# Patient Record
Sex: Female | Born: 1935 | Race: White | Hispanic: No | Smoking: Former smoker
Health system: Southern US, Community
[De-identification: ages and names within clinical notes are randomized; demographics above are authoritative.]

## PROBLEM LIST (undated history)

## (undated) DIAGNOSIS — T4145XA Adverse effect of unspecified anesthetic, initial encounter: Secondary | ICD-10-CM

## (undated) DIAGNOSIS — Z9889 Other specified postprocedural states: Secondary | ICD-10-CM

## (undated) DIAGNOSIS — I714 Abdominal aortic aneurysm, without rupture, unspecified: Secondary | ICD-10-CM

## (undated) DIAGNOSIS — I728 Aneurysm of other specified arteries: Secondary | ICD-10-CM

## (undated) DIAGNOSIS — K635 Polyp of colon: Secondary | ICD-10-CM

## (undated) DIAGNOSIS — C801 Malignant (primary) neoplasm, unspecified: Secondary | ICD-10-CM

## (undated) DIAGNOSIS — Z8719 Personal history of other diseases of the digestive system: Secondary | ICD-10-CM

## (undated) DIAGNOSIS — M1990 Unspecified osteoarthritis, unspecified site: Secondary | ICD-10-CM

## (undated) DIAGNOSIS — I10 Essential (primary) hypertension: Secondary | ICD-10-CM

## (undated) DIAGNOSIS — L409 Psoriasis, unspecified: Secondary | ICD-10-CM

## (undated) DIAGNOSIS — T8859XA Other complications of anesthesia, initial encounter: Secondary | ICD-10-CM

## (undated) DIAGNOSIS — R112 Nausea with vomiting, unspecified: Secondary | ICD-10-CM

## (undated) DIAGNOSIS — E785 Hyperlipidemia, unspecified: Secondary | ICD-10-CM

## (undated) HISTORY — DX: Abdominal aortic aneurysm, without rupture: I71.4

## (undated) HISTORY — DX: Polyp of colon: K63.5

## (undated) HISTORY — DX: Unspecified osteoarthritis, unspecified site: M19.90

## (undated) HISTORY — PX: HERNIA REPAIR: SHX51

## (undated) HISTORY — PX: EYE SURGERY: SHX253

## (undated) HISTORY — DX: Aneurysm of other specified arteries: I72.8

## (undated) HISTORY — DX: Essential (primary) hypertension: I10

## (undated) HISTORY — DX: Hyperlipidemia, unspecified: E78.5

## (undated) HISTORY — DX: Malignant (primary) neoplasm, unspecified: C80.1

## (undated) HISTORY — DX: Abdominal aortic aneurysm, without rupture, unspecified: I71.40

## (undated) HISTORY — PX: ORIF DISTAL RADIUS FRACTURE: SUR927

---

## 2001-08-22 HISTORY — PX: MASTECTOMY: SHX3

## 2001-09-06 DIAGNOSIS — Z853 Personal history of malignant neoplasm of breast: Secondary | ICD-10-CM

## 2001-09-19 ENCOUNTER — Encounter (INDEPENDENT_AMBULATORY_CARE_PROVIDER_SITE_OTHER): Payer: Self-pay | Admitting: *Deleted

## 2001-09-20 ENCOUNTER — Encounter: Payer: Self-pay | Admitting: Surgery

## 2001-09-21 ENCOUNTER — Ambulatory Visit (HOSPITAL_COMMUNITY): Admission: RE | Admit: 2001-09-21 | Discharge: 2001-09-22 | Payer: Self-pay | Admitting: Surgery

## 2001-09-21 ENCOUNTER — Encounter: Payer: Self-pay | Admitting: Surgery

## 2001-09-21 ENCOUNTER — Encounter (INDEPENDENT_AMBULATORY_CARE_PROVIDER_SITE_OTHER): Payer: Self-pay | Admitting: Specialist

## 2001-09-22 ENCOUNTER — Encounter: Payer: Self-pay | Admitting: Surgery

## 2002-05-08 ENCOUNTER — Encounter: Payer: Self-pay | Admitting: Unknown Physician Specialty

## 2002-05-08 ENCOUNTER — Ambulatory Visit (HOSPITAL_COMMUNITY): Admission: RE | Admit: 2002-05-08 | Discharge: 2002-05-08 | Payer: Self-pay | Admitting: Unknown Physician Specialty

## 2002-10-11 ENCOUNTER — Encounter (HOSPITAL_COMMUNITY): Payer: Self-pay | Admitting: Oncology

## 2002-10-11 ENCOUNTER — Encounter: Admission: RE | Admit: 2002-10-11 | Discharge: 2002-10-11 | Payer: Self-pay | Admitting: Oncology

## 2003-04-09 ENCOUNTER — Ambulatory Visit (HOSPITAL_COMMUNITY): Admission: RE | Admit: 2003-04-09 | Discharge: 2003-04-09 | Payer: Self-pay | Admitting: Oncology

## 2003-08-16 ENCOUNTER — Other Ambulatory Visit: Admission: RE | Admit: 2003-08-16 | Discharge: 2003-08-16 | Payer: Self-pay | Admitting: Obstetrics & Gynecology

## 2003-10-12 ENCOUNTER — Encounter: Admission: RE | Admit: 2003-10-12 | Discharge: 2003-10-12 | Payer: Self-pay | Admitting: Oncology

## 2004-02-12 ENCOUNTER — Ambulatory Visit: Payer: Self-pay | Admitting: Family Medicine

## 2004-03-12 ENCOUNTER — Ambulatory Visit: Payer: Self-pay | Admitting: Family Medicine

## 2004-03-24 ENCOUNTER — Ambulatory Visit: Payer: Self-pay | Admitting: Oncology

## 2004-03-24 ENCOUNTER — Ambulatory Visit (HOSPITAL_COMMUNITY): Admission: RE | Admit: 2004-03-24 | Discharge: 2004-03-24 | Payer: Self-pay | Admitting: Oncology

## 2004-04-08 ENCOUNTER — Ambulatory Visit: Payer: Self-pay | Admitting: Family Medicine

## 2004-05-28 ENCOUNTER — Ambulatory Visit: Payer: Self-pay | Admitting: Family Medicine

## 2004-07-18 ENCOUNTER — Ambulatory Visit: Payer: Self-pay | Admitting: Oncology

## 2004-08-28 ENCOUNTER — Ambulatory Visit: Payer: Self-pay | Admitting: Family Medicine

## 2004-10-09 ENCOUNTER — Ambulatory Visit: Payer: Self-pay | Admitting: Family Medicine

## 2004-11-12 ENCOUNTER — Ambulatory Visit: Payer: Self-pay | Admitting: Oncology

## 2004-12-03 ENCOUNTER — Other Ambulatory Visit: Admission: RE | Admit: 2004-12-03 | Discharge: 2004-12-03 | Payer: Self-pay | Admitting: Obstetrics & Gynecology

## 2004-12-31 ENCOUNTER — Ambulatory Visit: Payer: Self-pay | Admitting: Family Medicine

## 2005-01-15 ENCOUNTER — Ambulatory Visit: Payer: Self-pay | Admitting: Family Medicine

## 2005-03-12 ENCOUNTER — Ambulatory Visit: Payer: Self-pay | Admitting: Oncology

## 2005-03-20 ENCOUNTER — Ambulatory Visit (HOSPITAL_COMMUNITY): Admission: RE | Admit: 2005-03-20 | Discharge: 2005-03-20 | Payer: Self-pay | Admitting: Oncology

## 2005-04-21 ENCOUNTER — Ambulatory Visit: Payer: Self-pay | Admitting: Family Medicine

## 2005-06-29 ENCOUNTER — Ambulatory Visit: Payer: Self-pay | Admitting: Family Medicine

## 2005-07-08 ENCOUNTER — Ambulatory Visit: Payer: Self-pay | Admitting: Oncology

## 2005-07-09 LAB — CBC WITH DIFFERENTIAL/PLATELET
BASO%: 0.8 % (ref 0.0–2.0)
EOS%: 1.7 % (ref 0.0–7.0)
MCH: 29.4 pg (ref 26.0–34.0)
MCHC: 34.1 g/dL (ref 32.0–36.0)
MCV: 86.3 fL (ref 81.0–101.0)
MONO%: 8 % (ref 0.0–13.0)
RBC: 3.9 10*6/uL (ref 3.70–5.32)
RDW: 14.4 % (ref 11.3–14.5)
lymph#: 1.3 10*3/uL (ref 0.9–3.3)

## 2005-07-09 LAB — COMPREHENSIVE METABOLIC PANEL
ALT: 11 U/L (ref 0–40)
AST: 15 U/L (ref 0–37)
CO2: 27 mEq/L (ref 19–32)
Creatinine, Ser: 1.1 mg/dL (ref 0.4–1.2)
Total Bilirubin: 0.4 mg/dL (ref 0.3–1.2)

## 2005-07-09 LAB — LACTATE DEHYDROGENASE: LDH: 142 U/L (ref 94–250)

## 2005-09-29 ENCOUNTER — Ambulatory Visit: Payer: Self-pay | Admitting: Family Medicine

## 2005-10-08 ENCOUNTER — Ambulatory Visit: Payer: Self-pay | Admitting: Family Medicine

## 2005-10-29 ENCOUNTER — Ambulatory Visit: Payer: Self-pay | Admitting: Oncology

## 2005-11-04 LAB — COMPREHENSIVE METABOLIC PANEL
ALT: 10 U/L (ref 0–40)
Albumin: 4 g/dL (ref 3.5–5.2)
CO2: 25 mEq/L (ref 19–32)
Calcium: 9.3 mg/dL (ref 8.4–10.5)
Chloride: 106 mEq/L (ref 96–112)
Creatinine, Ser: 1.29 mg/dL — ABNORMAL HIGH (ref 0.40–1.20)
Potassium: 4.1 mEq/L (ref 3.5–5.3)
Sodium: 141 mEq/L (ref 135–145)
Total Protein: 7.2 g/dL (ref 6.0–8.3)

## 2005-11-04 LAB — CBC WITH DIFFERENTIAL/PLATELET
BASO%: 1 % (ref 0.0–2.0)
HCT: 33.7 % — ABNORMAL LOW (ref 34.8–46.6)
HGB: 11.6 g/dL (ref 11.6–15.9)
MCHC: 34.3 g/dL (ref 32.0–36.0)
MONO#: 0.3 10*3/uL (ref 0.1–0.9)
NEUT%: 71.1 % (ref 39.6–76.8)
RDW: 14.4 % (ref 11.3–14.5)
WBC: 5.1 10*3/uL (ref 3.9–10.0)
lymph#: 1 10*3/uL (ref 0.9–3.3)

## 2005-11-04 LAB — LACTATE DEHYDROGENASE: LDH: 139 U/L (ref 94–250)

## 2005-11-18 ENCOUNTER — Ambulatory Visit: Payer: Self-pay | Admitting: Family Medicine

## 2006-01-06 ENCOUNTER — Ambulatory Visit: Payer: Self-pay | Admitting: Family Medicine

## 2006-01-27 ENCOUNTER — Ambulatory Visit: Payer: Self-pay | Admitting: Family Medicine

## 2006-03-01 ENCOUNTER — Ambulatory Visit: Payer: Self-pay | Admitting: Oncology

## 2006-03-11 ENCOUNTER — Ambulatory Visit: Payer: Self-pay | Admitting: Family Medicine

## 2006-03-16 HISTORY — PX: JOINT REPLACEMENT: SHX530

## 2006-03-18 ENCOUNTER — Ambulatory Visit (HOSPITAL_COMMUNITY): Admission: RE | Admit: 2006-03-18 | Discharge: 2006-03-18 | Payer: Self-pay | Admitting: Oncology

## 2006-03-18 LAB — COMPREHENSIVE METABOLIC PANEL
AST: 14 U/L (ref 0–37)
Albumin: 4.3 g/dL (ref 3.5–5.2)
BUN: 16 mg/dL (ref 6–23)
CO2: 25 mEq/L (ref 19–32)
Calcium: 9.6 mg/dL (ref 8.4–10.5)
Chloride: 104 mEq/L (ref 96–112)
Creatinine, Ser: 0.94 mg/dL (ref 0.40–1.20)
Glucose, Bld: 103 mg/dL — ABNORMAL HIGH (ref 70–99)

## 2006-03-18 LAB — CBC WITH DIFFERENTIAL/PLATELET
Basophils Absolute: 0 10*3/uL (ref 0.0–0.1)
EOS%: 2.1 % (ref 0.0–7.0)
Eosinophils Absolute: 0.1 10*3/uL (ref 0.0–0.5)
HCT: 35 % (ref 34.8–46.6)
HGB: 11.8 g/dL (ref 11.6–15.9)
MCH: 28.5 pg (ref 26.0–34.0)
NEUT#: 3.1 10*3/uL (ref 1.5–6.5)
NEUT%: 65 % (ref 39.6–76.8)
lymph#: 1.1 10*3/uL (ref 0.9–3.3)

## 2006-03-18 LAB — LACTATE DEHYDROGENASE: LDH: 154 U/L (ref 94–250)

## 2006-05-11 ENCOUNTER — Ambulatory Visit: Payer: Self-pay | Admitting: Family Medicine

## 2006-06-08 ENCOUNTER — Inpatient Hospital Stay (HOSPITAL_COMMUNITY): Admission: RE | Admit: 2006-06-08 | Discharge: 2006-06-11 | Payer: Self-pay | Admitting: Orthopaedic Surgery

## 2006-08-03 ENCOUNTER — Ambulatory Visit: Payer: Self-pay | Admitting: Family Medicine

## 2006-08-13 ENCOUNTER — Ambulatory Visit: Payer: Self-pay | Admitting: Oncology

## 2006-08-17 LAB — CBC WITH DIFFERENTIAL/PLATELET
Basophils Absolute: 0 10*3/uL (ref 0.0–0.1)
Eosinophils Absolute: 0.1 10*3/uL (ref 0.0–0.5)
HGB: 11.9 g/dL (ref 11.6–15.9)
MONO#: 0.4 10*3/uL (ref 0.1–0.9)
NEUT#: 4.7 10*3/uL (ref 1.5–6.5)
RBC: 4.04 10*6/uL (ref 3.70–5.32)
RDW: 15.3 % — ABNORMAL HIGH (ref 11.3–14.5)
WBC: 6.3 10*3/uL (ref 3.9–10.0)
lymph#: 1.1 10*3/uL (ref 0.9–3.3)

## 2006-08-17 LAB — LACTATE DEHYDROGENASE: LDH: 157 U/L (ref 94–250)

## 2006-08-17 LAB — COMPREHENSIVE METABOLIC PANEL
ALT: 10 U/L (ref 0–35)
BUN: 20 mg/dL (ref 6–23)
CO2: 24 mEq/L (ref 19–32)
Calcium: 9.8 mg/dL (ref 8.4–10.5)
Chloride: 106 mEq/L (ref 96–112)
Creatinine, Ser: 1.02 mg/dL (ref 0.40–1.20)
Total Bilirubin: 0.4 mg/dL (ref 0.3–1.2)

## 2006-12-14 ENCOUNTER — Ambulatory Visit: Payer: Self-pay | Admitting: Oncology

## 2006-12-19 ENCOUNTER — Encounter: Admission: RE | Admit: 2006-12-19 | Discharge: 2006-12-19 | Payer: Self-pay | Admitting: Orthopaedic Surgery

## 2006-12-30 ENCOUNTER — Encounter: Admission: RE | Admit: 2006-12-30 | Discharge: 2006-12-30 | Payer: Self-pay | Admitting: Orthopaedic Surgery

## 2007-01-05 ENCOUNTER — Encounter: Admission: RE | Admit: 2007-01-05 | Discharge: 2007-01-05 | Payer: Self-pay | Admitting: Orthopaedic Surgery

## 2007-03-25 ENCOUNTER — Ambulatory Visit: Payer: Self-pay | Admitting: Oncology

## 2007-03-29 LAB — CBC WITH DIFFERENTIAL/PLATELET
Basophils Absolute: 0 10*3/uL (ref 0.0–0.1)
EOS%: 2 % (ref 0.0–7.0)
Eosinophils Absolute: 0.1 10*3/uL (ref 0.0–0.5)
HCT: 34.7 % — ABNORMAL LOW (ref 34.8–46.6)
HGB: 12 g/dL (ref 11.6–15.9)
MONO#: 0.4 10*3/uL (ref 0.1–0.9)
NEUT#: 2.7 10*3/uL (ref 1.5–6.5)
NEUT%: 65.3 % (ref 39.6–76.8)
RDW: 14.5 % (ref 11.3–14.5)
WBC: 4.1 10*3/uL (ref 3.9–10.0)
lymph#: 0.9 10*3/uL (ref 0.9–3.3)

## 2007-03-29 LAB — COMPREHENSIVE METABOLIC PANEL
AST: 15 U/L (ref 0–37)
Albumin: 4.2 g/dL (ref 3.5–5.2)
BUN: 22 mg/dL (ref 6–23)
CO2: 28 mEq/L (ref 19–32)
Calcium: 10 mg/dL (ref 8.4–10.5)
Chloride: 101 mEq/L (ref 96–112)
Creatinine, Ser: 1.08 mg/dL (ref 0.40–1.20)
Potassium: 3.9 mEq/L (ref 3.5–5.3)

## 2007-03-29 LAB — LACTATE DEHYDROGENASE: LDH: 151 U/L (ref 94–250)

## 2007-04-07 ENCOUNTER — Encounter: Admission: RE | Admit: 2007-04-07 | Discharge: 2007-04-07 | Payer: Self-pay | Admitting: Oncology

## 2007-05-13 ENCOUNTER — Ambulatory Visit: Payer: Self-pay | Admitting: Vascular Surgery

## 2007-09-21 ENCOUNTER — Ambulatory Visit: Payer: Self-pay | Admitting: Oncology

## 2007-09-26 LAB — COMPREHENSIVE METABOLIC PANEL
Albumin: 4.2 g/dL (ref 3.5–5.2)
Alkaline Phosphatase: 56 U/L (ref 39–117)
BUN: 24 mg/dL — ABNORMAL HIGH (ref 6–23)
Glucose, Bld: 96 mg/dL (ref 70–99)
Total Bilirubin: 0.5 mg/dL (ref 0.3–1.2)

## 2007-09-26 LAB — CBC WITH DIFFERENTIAL/PLATELET
Basophils Absolute: 0 10*3/uL (ref 0.0–0.1)
Eosinophils Absolute: 0.1 10*3/uL (ref 0.0–0.5)
HCT: 34.2 % — ABNORMAL LOW (ref 34.8–46.6)
HGB: 12 g/dL (ref 11.6–15.9)
LYMPH%: 22.2 % (ref 14.0–48.0)
MCV: 85.4 fL (ref 81.0–101.0)
MONO%: 8.4 % (ref 0.0–13.0)
NEUT#: 3.1 10*3/uL (ref 1.5–6.5)
Platelets: 276 10*3/uL (ref 145–400)

## 2007-09-26 LAB — LACTATE DEHYDROGENASE: LDH: 159 U/L (ref 94–250)

## 2008-03-27 ENCOUNTER — Ambulatory Visit: Payer: Self-pay | Admitting: Oncology

## 2008-03-29 ENCOUNTER — Ambulatory Visit (HOSPITAL_COMMUNITY): Admission: RE | Admit: 2008-03-29 | Discharge: 2008-03-29 | Payer: Self-pay | Admitting: Oncology

## 2008-03-29 LAB — CBC WITH DIFFERENTIAL/PLATELET
Basophils Absolute: 0 10*3/uL (ref 0.0–0.1)
EOS%: 2.7 % (ref 0.0–7.0)
HGB: 12.4 g/dL (ref 11.6–15.9)
MCH: 29.6 pg (ref 26.0–34.0)
MCV: 86.6 fL (ref 81.0–101.0)
MONO%: 7.8 % (ref 0.0–13.0)
RDW: 14.6 % — ABNORMAL HIGH (ref 11.3–14.5)

## 2008-03-29 LAB — COMPREHENSIVE METABOLIC PANEL
AST: 20 U/L (ref 0–37)
Alkaline Phosphatase: 74 U/L (ref 39–117)
BUN: 15 mg/dL (ref 6–23)
Creatinine, Ser: 0.84 mg/dL (ref 0.40–1.20)
Potassium: 3.7 mEq/L (ref 3.5–5.3)

## 2008-10-24 ENCOUNTER — Ambulatory Visit: Payer: Self-pay | Admitting: Oncology

## 2008-10-26 LAB — CBC WITH DIFFERENTIAL/PLATELET
BASO%: 1.6 % (ref 0.0–2.0)
EOS%: 2.5 % (ref 0.0–7.0)
MCH: 29.7 pg (ref 25.1–34.0)
MCHC: 34.1 g/dL (ref 31.5–36.0)
RBC: 4.04 10*6/uL (ref 3.70–5.45)
RDW: 14.3 % (ref 11.2–14.5)
WBC: 4.1 10*3/uL (ref 3.9–10.3)
lymph#: 1 10*3/uL (ref 0.9–3.3)

## 2008-10-26 LAB — COMPREHENSIVE METABOLIC PANEL
ALT: 13 U/L (ref 0–35)
AST: 16 U/L (ref 0–37)
Albumin: 4.1 g/dL (ref 3.5–5.2)
Alkaline Phosphatase: 65 U/L (ref 39–117)
BUN: 20 mg/dL (ref 6–23)
Potassium: 4.1 mEq/L (ref 3.5–5.3)
Sodium: 140 mEq/L (ref 135–145)
Total Protein: 7.4 g/dL (ref 6.0–8.3)

## 2008-10-26 LAB — LACTATE DEHYDROGENASE: LDH: 155 U/L (ref 94–250)

## 2008-10-31 ENCOUNTER — Encounter: Admission: RE | Admit: 2008-10-31 | Discharge: 2008-10-31 | Payer: Self-pay | Admitting: Oncology

## 2008-12-10 ENCOUNTER — Ambulatory Visit (HOSPITAL_COMMUNITY): Admission: RE | Admit: 2008-12-10 | Discharge: 2008-12-10 | Payer: Self-pay | Admitting: Family Medicine

## 2009-01-09 ENCOUNTER — Ambulatory Visit: Payer: Self-pay | Admitting: Vascular Surgery

## 2009-01-10 ENCOUNTER — Encounter (INDEPENDENT_AMBULATORY_CARE_PROVIDER_SITE_OTHER): Payer: Self-pay

## 2009-01-10 LAB — CONVERTED CEMR LAB
BUN: 16 mg/dL
Calcium: 9.7 mg/dL
Chloride: 104 meq/L
Creatinine, Ser: 0.88 mg/dL

## 2009-01-16 ENCOUNTER — Ambulatory Visit: Payer: Self-pay | Admitting: Vascular Surgery

## 2009-01-16 ENCOUNTER — Encounter: Admission: RE | Admit: 2009-01-16 | Discharge: 2009-01-16 | Payer: Self-pay | Admitting: Vascular Surgery

## 2009-01-17 ENCOUNTER — Encounter: Payer: Self-pay | Admitting: Cardiology

## 2009-01-23 ENCOUNTER — Encounter: Payer: Self-pay | Admitting: Cardiology

## 2009-01-30 ENCOUNTER — Encounter (INDEPENDENT_AMBULATORY_CARE_PROVIDER_SITE_OTHER): Payer: Self-pay

## 2009-01-30 ENCOUNTER — Ambulatory Visit: Payer: Self-pay | Admitting: Cardiology

## 2009-01-30 DIAGNOSIS — I714 Abdominal aortic aneurysm, without rupture, unspecified: Secondary | ICD-10-CM | POA: Insufficient documentation

## 2009-01-30 DIAGNOSIS — I10 Essential (primary) hypertension: Secondary | ICD-10-CM

## 2009-01-30 DIAGNOSIS — R9431 Abnormal electrocardiogram [ECG] [EKG]: Secondary | ICD-10-CM

## 2009-02-05 ENCOUNTER — Encounter: Payer: Self-pay | Admitting: Cardiology

## 2009-02-05 ENCOUNTER — Encounter (HOSPITAL_COMMUNITY): Admission: RE | Admit: 2009-02-05 | Discharge: 2009-03-07 | Payer: Self-pay | Admitting: Cardiology

## 2009-02-05 ENCOUNTER — Ambulatory Visit: Payer: Self-pay | Admitting: Cardiology

## 2009-02-11 ENCOUNTER — Encounter (INDEPENDENT_AMBULATORY_CARE_PROVIDER_SITE_OTHER): Payer: Self-pay

## 2009-02-12 ENCOUNTER — Telehealth (INDEPENDENT_AMBULATORY_CARE_PROVIDER_SITE_OTHER): Payer: Self-pay | Admitting: *Deleted

## 2009-02-13 ENCOUNTER — Ambulatory Visit: Payer: Self-pay | Admitting: Vascular Surgery

## 2009-03-01 ENCOUNTER — Telehealth (INDEPENDENT_AMBULATORY_CARE_PROVIDER_SITE_OTHER): Payer: Self-pay

## 2009-05-16 ENCOUNTER — Ambulatory Visit: Payer: Self-pay | Admitting: Oncology

## 2009-05-20 LAB — COMPREHENSIVE METABOLIC PANEL
AST: 23 U/L (ref 0–37)
Albumin: 4 g/dL (ref 3.5–5.2)
Alkaline Phosphatase: 56 U/L (ref 39–117)
Potassium: 4.4 mEq/L (ref 3.5–5.3)
Sodium: 143 mEq/L (ref 135–145)
Total Protein: 7.6 g/dL (ref 6.0–8.3)

## 2009-05-20 LAB — CBC WITH DIFFERENTIAL/PLATELET
EOS%: 1.9 % (ref 0.0–7.0)
Eosinophils Absolute: 0.1 10*3/uL (ref 0.0–0.5)
MCH: 30.4 pg (ref 25.1–34.0)
MCV: 87.7 fL (ref 79.5–101.0)
MONO%: 6.7 % (ref 0.0–14.0)
NEUT#: 3.6 10*3/uL (ref 1.5–6.5)
RBC: 4.25 10*6/uL (ref 3.70–5.45)
RDW: 14.4 % (ref 11.2–14.5)

## 2009-08-14 ENCOUNTER — Encounter: Admission: RE | Admit: 2009-08-14 | Discharge: 2009-08-14 | Payer: Self-pay | Admitting: Vascular Surgery

## 2009-08-14 ENCOUNTER — Ambulatory Visit: Payer: Self-pay | Admitting: Vascular Surgery

## 2009-09-09 ENCOUNTER — Ambulatory Visit: Payer: Self-pay | Admitting: Vascular Surgery

## 2009-09-09 ENCOUNTER — Encounter: Payer: Self-pay | Admitting: Vascular Surgery

## 2009-09-09 ENCOUNTER — Inpatient Hospital Stay (HOSPITAL_COMMUNITY): Admission: RE | Admit: 2009-09-09 | Discharge: 2009-09-17 | Payer: Self-pay | Admitting: Vascular Surgery

## 2009-09-09 HISTORY — PX: ABDOMINAL AORTIC ANEURYSM REPAIR: SUR1152

## 2009-09-25 ENCOUNTER — Ambulatory Visit: Payer: Self-pay | Admitting: Vascular Surgery

## 2009-10-16 ENCOUNTER — Ambulatory Visit: Payer: Self-pay | Admitting: Vascular Surgery

## 2009-10-16 ENCOUNTER — Encounter: Payer: Self-pay | Admitting: Cardiology

## 2009-11-13 ENCOUNTER — Ambulatory Visit: Payer: Self-pay | Admitting: Oncology

## 2009-11-15 LAB — CBC WITH DIFFERENTIAL/PLATELET
BASO%: 0.7 % (ref 0.0–2.0)
Basophils Absolute: 0 10*3/uL (ref 0.0–0.1)
Eosinophils Absolute: 0.1 10*3/uL (ref 0.0–0.5)
HGB: 11.5 g/dL — ABNORMAL LOW (ref 11.6–15.9)
MCV: 86.3 fL (ref 79.5–101.0)
MONO#: 0.3 10*3/uL (ref 0.1–0.9)
MONO%: 5.8 % (ref 0.0–14.0)
RBC: 3.96 10*6/uL (ref 3.70–5.45)
RDW: 14.6 % — ABNORMAL HIGH (ref 11.2–14.5)
lymph#: 1.2 10*3/uL (ref 0.9–3.3)

## 2009-11-15 LAB — COMPREHENSIVE METABOLIC PANEL
ALT: 13 U/L (ref 0–35)
AST: 21 U/L (ref 0–37)
Alkaline Phosphatase: 50 U/L (ref 39–117)
BUN: 20 mg/dL (ref 6–23)
Calcium: 9.8 mg/dL (ref 8.4–10.5)
Creatinine, Ser: 1.04 mg/dL (ref 0.40–1.20)
Total Bilirubin: 0.6 mg/dL (ref 0.3–1.2)

## 2009-11-15 LAB — LACTATE DEHYDROGENASE: LDH: 125 U/L (ref 94–250)

## 2009-11-21 ENCOUNTER — Encounter: Admission: RE | Admit: 2009-11-21 | Discharge: 2009-11-21 | Payer: Self-pay | Admitting: Oncology

## 2010-04-15 NOTE — Letter (Signed)
Summary: [facility]  [facility]   Imported By: Marylou Mccoy 10/31/2009 11:12:53  _____________________________________________________________________  External Attachment:    Type:   Image     Comment:   External Document

## 2010-05-15 ENCOUNTER — Other Ambulatory Visit (HOSPITAL_COMMUNITY): Payer: Self-pay | Admitting: Oncology

## 2010-05-15 ENCOUNTER — Encounter (HOSPITAL_BASED_OUTPATIENT_CLINIC_OR_DEPARTMENT_OTHER): Payer: Medicare Other | Admitting: Oncology

## 2010-05-15 DIAGNOSIS — M810 Age-related osteoporosis without current pathological fracture: Secondary | ICD-10-CM

## 2010-05-15 DIAGNOSIS — C50919 Malignant neoplasm of unspecified site of unspecified female breast: Secondary | ICD-10-CM

## 2010-05-15 LAB — CBC WITH DIFFERENTIAL/PLATELET
BASO%: 1 % (ref 0.0–2.0)
Eosinophils Absolute: 0.1 10*3/uL (ref 0.0–0.5)
LYMPH%: 22.7 % (ref 14.0–49.7)
MCHC: 34.3 g/dL (ref 31.5–36.0)
MCV: 85.8 fL (ref 79.5–101.0)
MONO%: 9.5 % (ref 0.0–14.0)
Platelets: 240 10*3/uL (ref 145–400)
RBC: 4.12 10*6/uL (ref 3.70–5.45)

## 2010-05-15 LAB — COMPREHENSIVE METABOLIC PANEL
ALT: 13 U/L (ref 0–35)
CO2: 26 mEq/L (ref 19–32)
Creatinine, Ser: 1.19 mg/dL (ref 0.40–1.20)
Total Bilirubin: 0.4 mg/dL (ref 0.3–1.2)

## 2010-05-15 LAB — LACTATE DEHYDROGENASE: LDH: 147 U/L (ref 94–250)

## 2010-06-01 LAB — BASIC METABOLIC PANEL
BUN: 11 mg/dL (ref 6–23)
BUN: 8 mg/dL (ref 6–23)
BUN: 9 mg/dL (ref 6–23)
BUN: 12 mg/dL (ref 6–23)
CO2: 24 mEq/L (ref 19–32)
CO2: 29 mEq/L (ref 19–32)
CO2: 30 mEq/L (ref 19–32)
CO2: 25 mEq/L (ref 19–32)
CO2: 26 mEq/L (ref 19–32)
Calcium: 8.4 mg/dL (ref 8.4–10.5)
Calcium: 8.8 mg/dL (ref 8.4–10.5)
Calcium: 9.2 mg/dL (ref 8.4–10.5)
Calcium: 7 mg/dL — ABNORMAL LOW (ref 8.4–10.5)
Calcium: 7.1 mg/dL — ABNORMAL LOW (ref 8.4–10.5)
Calcium: 7.9 mg/dL — ABNORMAL LOW (ref 8.4–10.5)
Chloride: 109 mEq/L (ref 96–112)
Chloride: 107 mEq/L (ref 96–112)
Chloride: 108 mEq/L (ref 96–112)
Creatinine, Ser: 0.78 mg/dL (ref 0.4–1.2)
Creatinine, Ser: 0.79 mg/dL (ref 0.4–1.2)
Creatinine, Ser: 0.85 mg/dL (ref 0.4–1.2)
Creatinine, Ser: 0.85 mg/dL (ref 0.4–1.2)
Creatinine, Ser: 0.97 mg/dL (ref 0.4–1.2)
Creatinine, Ser: 1.04 mg/dL (ref 0.4–1.2)
GFR calc Af Amer: >60 mL/min (ref >60)
GFR calc Af Amer: >60 mL/min (ref >60)
GFR calc Af Amer: >60 mL/min (ref >60)
GFR calc Af Amer: >60 mL/min (ref >60)
GFR calc Af Amer: >60 mL/min (ref >60)
GFR calc non Af Amer: >60 mL/min (ref >60)
GFR calc non Af Amer: >60 mL/min (ref >60)
GFR calc non Af Amer: >60 mL/min (ref >60)
GFR calc non Af Amer: 57 mL/min — ABNORMAL LOW (ref >60)
Glucose, Bld: 103 mg/dL — ABNORMAL HIGH (ref 70–99)
Glucose, Bld: 112 mg/dL — ABNORMAL HIGH (ref 70–99)
Glucose, Bld: 120 mg/dL — ABNORMAL HIGH (ref 70–99)
Glucose, Bld: 134 mg/dL — ABNORMAL HIGH (ref 70–99)
Potassium: 3.8 mEq/L (ref 3.5–5.1)
Potassium: 4.2 mEq/L (ref 3.5–5.1)
Sodium: 138 mEq/L (ref 135–145)
Sodium: 143 mEq/L (ref 135–145)
Sodium: 139 mEq/L (ref 135–145)

## 2010-06-01 LAB — POCT I-STAT, CHEM 8
Calcium, Ion: 1.07 mmol/L — ABNORMAL LOW (ref 1.12–1.32)
Calcium, Ion: 1.21 mmol/L (ref 1.12–1.32)
Chloride: 109 mEq/L (ref 96–112)
Creatinine, Ser: 0.7 mg/dL (ref 0.4–1.2)
Glucose, Bld: 118 mg/dL — ABNORMAL HIGH (ref 70–99)
Glucose, Bld: 165 mg/dL — ABNORMAL HIGH (ref 70–99)
HCT: 31 % — ABNORMAL LOW (ref 36.0–46.0)
HCT: 37 % (ref 36.0–46.0)
Hemoglobin: 10.5 g/dL — ABNORMAL LOW (ref 12.0–15.0)
TCO2: 19 mmol/L (ref 0–100)

## 2010-06-01 LAB — COMPREHENSIVE METABOLIC PANEL
ALT: 17 U/L (ref 0–35)
ALT: 21 U/L (ref 0–35)
AST: 24 U/L (ref 0–37)
AST: 33 U/L (ref 0–37)
Albumin: 2.2 g/dL — ABNORMAL LOW (ref 3.5–5.2)
Alkaline Phosphatase: 23 U/L — ABNORMAL LOW (ref 39–117)
BUN: 14 mg/dL (ref 6–23)
CO2: 24 mEq/L (ref 19–32)
Calcium: 10.3 mg/dL (ref 8.4–10.5)
Chloride: 107 mEq/L (ref 96–112)
Creatinine, Ser: 0.92 mg/dL (ref 0.4–1.2)
GFR calc Af Amer: >60 mL/min (ref >60)
GFR calc Af Amer: >60 mL/min (ref >60)
GFR calc non Af Amer: 59 mL/min — ABNORMAL LOW (ref >60)
Potassium: 3.9 mEq/L (ref 3.5–5.1)
Sodium: 136 mEq/L (ref 135–145)
Sodium: 138 mEq/L (ref 135–145)
Total Bilirubin: 0.6 mg/dL (ref 0.3–1.2)
Total Protein: 3.9 g/dL — ABNORMAL LOW (ref 6.0–8.3)
Total Protein: 7.4 g/dL (ref 6.0–8.3)

## 2010-06-01 LAB — POCT I-STAT 7, (LYTES, BLD GAS, ICA,H+H)
Bicarbonate: 19.9 mEq/L — ABNORMAL LOW (ref 20.0–24.0)
HCT: 40 % (ref 36.0–46.0)
Hemoglobin: 13.6 g/dL (ref 12.0–15.0)
Sodium: 142 mEq/L (ref 135–145)
TCO2: 21 mmol/L (ref 0–100)
pH, Arterial: 7.275 — ABNORMAL LOW (ref 7.350–7.400)
pO2, Arterial: 140 mmHg — ABNORMAL HIGH (ref 80.0–100.0)

## 2010-06-01 LAB — PROTIME-INR
INR: 1.5 — ABNORMAL HIGH (ref 0.00–1.49)
Prothrombin Time: 18 seconds — ABNORMAL HIGH (ref 11.6–15.2)

## 2010-06-01 LAB — CBC
HCT: 26 % — ABNORMAL LOW (ref 36.0–46.0)
HCT: 38.2 % (ref 36.0–46.0)
Hemoglobin: 10.3 g/dL — ABNORMAL LOW (ref 12.0–15.0)
Hemoglobin: 9.1 g/dL — ABNORMAL LOW (ref 12.0–15.0)
Hemoglobin: 13.3 g/dL (ref 12.0–15.0)
MCH: 31.8 pg (ref 26.0–34.0)
MCH: 32 pg (ref 26.0–34.0)
MCH: 31.4 pg (ref 26.0–34.0)
MCH: 31.5 pg (ref 26.0–34.0)
MCH: 31.6 pg (ref 26.0–34.0)
MCHC: 35.1 g/dL (ref 30.0–36.0)
MCHC: 34.6 g/dL (ref 30.0–36.0)
MCHC: 34.7 g/dL (ref 30.0–36.0)
MCHC: 34.7 g/dL (ref 30.0–36.0)
MCV: 91.8 fL (ref 78.0–100.0)
MCV: 91.3 fL (ref 78.0–100.0)
MCV: 91.7 fL (ref 78.0–100.0)
MCV: 91.9 fL (ref 78.0–100.0)
Platelets: 120 10*3/uL — ABNORMAL LOW (ref 150–400)
Platelets: 227 10*3/uL (ref 150–400)
Platelets: 198 10*3/uL (ref 150–400)
Platelets: 77 10*3/uL — ABNORMAL LOW (ref 150–400)
Platelets: 90 10*3/uL — ABNORMAL LOW (ref 150–400)
Platelets: 96 10*3/uL — ABNORMAL LOW (ref 150–400)
RBC: 2.83 MIL/uL — ABNORMAL LOW (ref 3.87–5.11)
RBC: 3.11 MIL/uL — ABNORMAL LOW (ref 3.87–5.11)
RBC: 3.56 MIL/uL — ABNORMAL LOW (ref 3.87–5.11)
RBC: 4.18 MIL/uL (ref 3.87–5.11)
RDW: 13.8 % (ref 11.5–15.5)
RDW: 14 % (ref 11.5–15.5)
RDW: 14.3 % (ref 11.5–15.5)
WBC: 10.7 10*3/uL — ABNORMAL HIGH (ref 4.0–10.5)
WBC: 9.2 10*3/uL (ref 4.0–10.5)

## 2010-06-01 LAB — POCT I-STAT 3, ART BLOOD GAS (G3+)
O2 Saturation: 95 %
O2 Saturation: 96 %
pCO2 arterial: 43.2 mmHg (ref 35.0–45.0)
pCO2 arterial: 43.6 mmHg (ref 35.0–45.0)
pH, Arterial: 7.323 — ABNORMAL LOW (ref 7.350–7.400)
pH, Arterial: 7.392 (ref 7.350–7.400)
pO2, Arterial: 77 mmHg — ABNORMAL LOW (ref 80.0–100.0)
pO2, Arterial: 80 mmHg (ref 80.0–100.0)

## 2010-06-01 LAB — TYPE AND SCREEN: ABO/RH(D): O POS

## 2010-06-01 LAB — BLOOD GAS, ARTERIAL
Bicarbonate: 24.8 mEq/L — ABNORMAL HIGH (ref 20.0–24.0)
Drawn by: 206361
FIO2: 0.21 %
O2 Saturation: 95.1 %
Patient temperature: 98.6
pH, Arterial: 7.44 — ABNORMAL HIGH (ref 7.350–7.400)

## 2010-06-01 LAB — PREPARE FRESH FROZEN PLASMA

## 2010-06-01 LAB — APTT
aPTT: 25 seconds (ref 24–37)
aPTT: 32 seconds (ref 24–37)
aPTT: 33 seconds (ref 24–37)

## 2010-06-01 LAB — URINALYSIS, ROUTINE W REFLEX MICROSCOPIC
Ketones, ur: NEGATIVE mg/dL
Nitrite: NEGATIVE
Protein, ur: NEGATIVE mg/dL
pH: 7 (ref 5.0–8.0)

## 2010-06-01 LAB — AMYLASE: Amylase: 202 U/L — ABNORMAL HIGH (ref 0–105)

## 2010-06-01 LAB — MAGNESIUM: Magnesium: 1.3 mg/dL — ABNORMAL LOW (ref 1.5–2.5)

## 2010-06-01 LAB — SURGICAL PCR SCREEN: Staphylococcus aureus: POSITIVE — AB

## 2010-06-01 LAB — PLATELET COUNT: Platelets: 112 10*3/uL — ABNORMAL LOW (ref 150–400)

## 2010-07-29 ENCOUNTER — Other Ambulatory Visit: Payer: Self-pay | Admitting: Vascular Surgery

## 2010-07-29 DIAGNOSIS — I714 Abdominal aortic aneurysm, without rupture: Secondary | ICD-10-CM

## 2010-07-29 NOTE — Assessment & Plan Note (Signed)
OFFICE VISIT   [name], [name]  DOB:  03-08-1927                                       10/16/2009  NUUVO#:[id number]   The patient returns for follow-up today.  She had repair of her  abdominal aortic aneurysm on June 27.  She has been doing well since  then.  Her appetite has not completely returned to normal but is  approaching that.  She is over time increasing her activity and feels  like her physical endurance has improved as well.  She denies any nausea  or vomiting.  She has normal bowel function.   PHYSICAL EXAMINATION:  Today, blood pressure is 123/75 in the right arm,  heart rate is 76 and regular, respirations 20.  Midline abdominal  incision is well-healed with no evidence of hernia.  She has 2+ femoral  pulses bilaterally.   Overall the patient is dieing well at this point.  Will see her back in  one year's time for CT scan of the abdomen and pelvis.  She does have a  history of a small splenic artery aneurysm.  If this is stable on the  next exam and overall her graft looks good, then probably she will not  need further follow-up.  We will see her again in one year.     Janetta Hora. Fields, MD  Electronically Signed   CEF/MEDQ  D:  10/16/2009  T:  10/16/2009  Job:  [id number]   cc:   Delaney Meigs, M.D.  Jonelle Sidle, MD

## 2010-07-29 NOTE — Assessment & Plan Note (Signed)
OFFICE VISIT   MCCARTNEY, BRUCKS J  DOB:  June 02, 1926                                       08/14/2009  NFAOZ#:[id number]   HISTORY OF PRESENT ILLNESS:  The patient is an 77 year old female who  returns today for followup of an abdominal aortic aneurysm.  Her  aneurysm was 4.3 cm in diameter in December of 2007.  She was last seen  in December of 2010 at which time the aneurysm had grown to 5.4 cm in  diameter.  She returns today for further followup regarding the  aneurysm.  She continues to deny any abdominal or back pain.   CHRONIC MEDICAL PROBLEMS:  Include elevated cholesterol, hypertension,  both of which are currently controlled.  She denies any symptoms of  claudication.  She denies any family history of abdominal aortic  aneurysm.  She has had no significant medical problems since her last  office visit.  She is a former smoker, quit approximately 6 years ago.  She has had a previous cardiac stress test by Dr. Diona Browner in November  of 2010 which showed no significant ischemia with an ejection fraction  of 67%.   Review of systems is unchanged from her office visit in October of 2010.  Please see intake referral form from that visit.   PAST SURGICAL HISTORY:  Left mastectomy for cancer and a right total hip  replacement.   SOCIAL HISTORY:  She is a former smoker but quit as mentioned above.   MEDICATIONS:  1. Include Crestor 10 mg once a day.  2. Antra 130 mg once a day.  3. Amlodipine 5 mg once a day.  4. Lexapro 10 mg once a day.  5. Micardis 80 mg once a day.  6. Actonel 35 mg once a day.  7. Metoprolol 25 mg once a day.  8. Calcium 600 D once a day.  9. Colchicine 0.6 mg once a day.   ALLERGIES:  She has allergies listed to sulfa and prednisone.   PHYSICAL EXAM:  Vital signs:  Blood pressure is 155/82 in the right arm,  heart rate is 78 and regular.  HEENT:  Unremarkable.  Neck:  Has 2+  carotid pulses without bruit.  Chest:   Clear to auscultation.  Cardiac:  Exam is regular rate and rhythm without murmur.  Abdomen:  Is soft,  obese, nontender, nondistended.  No palpable masses.  She has 1+ femoral  pulses bilaterally.  She has absent popliteal and pedal pulses.  Musculoskeletal:  Exam shows no obvious major joint deformities.  Neurologic:  Exam shows symmetric upper extremity and lower extremity  motor strength which is 5/5.  Skin:  Has no open ulcers or rashes.   I reviewed her CT angiogram of the abdomen and pelvis today.  This shows  that the aneurysm has now grown to 5.6 cm in diameter.  The external  iliac artery diameters are approximately 9 mm.  There is minimal  tortuosity of the iliac vessels and no aneurysmal dilatation of the  iliac arteries.  The aneurysm is fairly focal and tapers down to a  normal aortic diameter and it seems to have a reasonable neck which  measures 28 mm in diameter for placement of endovascular stent graft.  I  had a lengthy discussion with the patient today as well as her daughter.  I believe  that the aneurysm has grown to a size now that it warrants  repair.  We have scheduled this for her on 09/09/2009.  I believe it  should be amendable to repair with an endovascular stent graft and we  will have the [employer] review our measurements and if she is not a  candidate for a Gore graft will consider placing a Cook graft.  If she  is not a candidate for either one we would need to consider open repair.  I discussed with the patient today endovascular stent graft repair.  Risks, benefits, possible complications of this.  If she requires open  repair I will call her regarding this.  Otherwise we will plan to fix  her aneurysm with a stent graft on June 27.     Janetta Hora. Fields, MD  Electronically Signed   CEF/MEDQ  D:  08/14/2009  T:  08/15/2009  Job:  [id number]   cc:   Delaney Meigs, M.D.  Jonelle Sidle, MD

## 2010-07-29 NOTE — Assessment & Plan Note (Signed)
OFFICE VISIT   DEMARIA, DEENEY J  DOB:  [birth year]                                       01/09/2009  ZOXWR#:[id number]   CHIEF COMPLAINT:  Abdominal aortic aneurysm.   HISTORY OF PRESENT ILLNESS:  The patient is an 77 year old female with a  known abdominal aortic aneurysm.  Recent aortic ultrasound showed that  her aneurysm had grown to 5.4 cm in diameter.  She is back for further  evaluation of this.  She was last seen by one of my partners, Dr. Arbie Cookey,  in 2008.  At that time, the aneurysm was 4.3 cm in diameter.   Atherosclerotic risk factors include elevated cholesterol, hypertension.  She also is greater than 38 years of age.  She denies significant family  history of aneurysm disease.  She denies abdominal pain, back pain.  She  also has a history of elevated cholesterol.  She is a former smoker but  quit 5 years ago.   Past surgical history showed a left mastectomy for cancer.  She had a  right total hip replacement.   Medications include Entera 130 mg once a day, amlodipine 5 mg once a  day, Crestor 10 mg once a day, Actonel 150 mg once a month, Micardis 80  mg once a day, calcium D 600 mg twice a day.   She has allergies listed to sulfa.  She did not know the reaction to  this.  Prednisone previously has caused her to have some face swelling.   Full review of systems was performed.  Please see intake referral form  for details.  This was notable for chronic back pain and gallop.   PHYSICAL EXAMINATION:  Blood pressure is 142/85 in the right arm, heart  rate of 90 and regular.  Temperature is 98.  HEENT:  Unremarkable.  Neck  has 2+ carotid pulses without bruit.  Chest is clear to auscultation.  Cardiac exam is regular rate rhythm without murmur.  Abdomen is obese,  soft, nontender, nondistended.  She has an easily reducible umbilical  hernia.  She has no masses in the abdomen.  EXTREMITIES:  She has 2+  radial, 2+ femoral 2+ dorsalis  pedis pulses bilaterally.  She has no  lower extremity edema.  Neurologic exam shows symmetric upper extremity  and lower extremity motor strength which is 5/5 and symmetric.  Skin has  no significant rashes.   I reviewed her aortic ultrasound which shows an aneurysm of greater than  5 cm in diameter.   I believe the best option at this point would be to obtain a CT scan of  the abdomen and pelvis to see whether or not a CT confirms the  ultrasound findings.  She will have her CT angio performed next week and  then return for follow-up.  If the aneurysm is truly 5.4 cm in diameter  or greater, we will consider operative repair.  Due to her age and  obesity, she would probably be a better candidate for stent graft repair  if possible.  We will discuss these option with her when she returns for  her office visit next week.   Janetta Hora. Fields, MD  Electronically Signed   CEF/MEDQ  D:  01/09/2009  T:  01/10/2009  Job:  [id number]   cc:   Delaney Meigs, M.D.

## 2010-07-29 NOTE — Assessment & Plan Note (Signed)
OFFICE VISIT   [name], [name]  DOB:  07-Jul-1926                                       02/13/2009  UEAVW#:[id number]   The patient returns for followup today.  She was last seen in the office  on November 3.  We have been evaluating her for abdominal aortic  aneurysm.  She has no symptoms of abdominal or back pain currently.  She  returns today after cardiology evaluation by Dr. Diona Browner.   PHYSICAL EXAM:  Vital signs:  Blood pressure 145/80, heart rate is 80  and regular.  Abdomen:  Is soft, nontender, nondistended.  Extremities:  Have no edema.   I reviewed her records sent by Dr. Ival Bible office today.  She had an  echocardiogram performed on November 23 which showed an EF of 65%-70%.  She had a nuclear stress test on November 23 which showed a small area  of mixed ischemia and infarction in the territory of the left anterior  descending artery.  She was overall to have moderate cardiac risk for  operative repair of her aneurysm.   I reviewed all these findings as well as the CT findings with the  patient today.  Again, I believe she is a fairly marginal candidate for  stent graft repair on anatomic considerations.  Her aneurysm is  currently 5.4 cm in diameter.  The risk of rupture of an aneurysm of  this size is less than 1%.  I believe the best option at this point in  light of her age and cardiac status and overall picture would be  continued observation.  If the aneurysm continues to grow between 5.5  and 6 cm we would again need to revisit the issue.  Potentially she  might be a candidate for a Cook stent graft but again the anatomy is  fairly marginal and most likely she would require open repair.  The  family was in agreement with continued observation for now.  She will  have a repeat CT angio of her abdomen and pelvis in six months' time.   Janetta Hora. Fields, MD  Electronically Signed   CEF/MEDQ  D:  02/15/2009  T:  02/15/2009   Job:  [id number]   cc:   Delaney Meigs, M.D.  Jonelle Sidle, MD

## 2010-07-29 NOTE — Assessment & Plan Note (Signed)
OFFICE VISIT   [name], [name]  DOB:  1926/09/04                                       01/16/2009  ZOXWR#:[id number]   The patient is an 77 year old female who was seen last week for  evaluation of abdominal aortic aneurysm.  She returns this week after CT  angiogram for further evaluation.  She continues to deny any abdominal  or back pain.   PHYSICAL EXAM:  Blood pressure is 150/94 in the right arm, heart rate is  97 and regular.  Abdomen is obese, soft, nontender, nondistended.  No  palpable pulsatile mass.  She has 2+ femoral pulses bilaterally.   I reviewed her CT scan of the abdomen and pelvis today.  This shows a  5.4 cm infrarenal abdominal aortic aneurysm.  However, she has a fairly  short neck and I do not believe the [employer] stent graft would be  possible due to this.  The neck is approximately 1 cm in length and has  a flaring taper distally.  This tapers out to about 30 mm in diameter  and would be too large for the Gore graft but may be a candidate for the  [facility] stent graft.  Additionally, the length from the renal arteries to  where the aorta becomes more normal in caliber is a fairly short length.  However, the 96 graft may be suitable.  Iliac diameter is approximately  8 mm on both sides so access may also be an issue.  Due to the patient's  age and her obesity I still believe a stent graft would be in her best  interest if possible.   I had a lengthy discussion with the patient and her daughter regarding  this today.  I will have the [facility] representative review the films during  the next week.  In addition since she may not be a stent graft candidate  we will also have her seen by the [facility] cardiologists in [place]  for preoperative risk stratification in the event that she requires open  repair.  She will follow up in 2 weeks after her cardiology evaluation  and we have had time to review films with the [place] people.   Janetta Hora. Fields, MD  Electronically Signed   CEF/MEDQ  D:  01/17/2009  T:  01/17/2009  Job:  [id number]   cc:   Delaney Meigs, M.D.

## 2010-07-29 NOTE — Procedures (Signed)
DUPLEX ULTRASOUND OF ABDOMINAL AORTA   INDICATION:  Followup evaluation of abdominal aortic aneurysm.   HISTORY:  Diabetes:  No.  Cardiac:  No.  Hypertension:  No.  Smoking:  No.  Connective Tissue Disorder:  Family History:  No.  Previous Surgery:  No.   DUPLEX EXAM:         AP (cm)                   TRANSVERSE (cm)  Proximal             3.9 cm                    4.0 cm  Mid                  4.5 cm                    4.7 cm  Distal               2.9 cm                    2.7 cm  Right Iliac          0.9 cm                    1.1 cm  Left Iliac           0.8 cm                    0.9 cm   PREVIOUS:  Date: 02/25/2006  AP:  4.3  TRANSVERSE:   IMPRESSION:  Abdominal aortic aneurysm slightly larger than previously  recorded.   ___________________________________________  Larina Earthly, M.D.   MC/MEDQ  D:  05/13/2007  T:  05/14/2007  Job:  [id number]

## 2010-08-01 ENCOUNTER — Encounter: Payer: Self-pay | Admitting: Vascular Surgery

## 2010-08-01 NOTE — Discharge Summary (Signed)
[name], [name]         ACCOUNT NO.:  [id number]   MEDICAL RECORD NO.:  [id number]          PATIENT TYPE:  INP   LOCATION:  5002                         FACILITY:  [facility]   PHYSICIAN:  Claude Manges. Whitfield, M.D.DATE OF BIRTH:  January 10, 1927   DATE OF ADMISSION:  06/08/2006  DATE OF DISCHARGE:  06/11/2006                               DISCHARGE SUMMARY   ADMISSION DIAGNOSES:  1. End-stage osteoarthritis right hip.  2. Hypertension.  3. Osteoporosis.  4. History of  left breast cancer and mastectomy.  5. History of benign polyps in the colon.   DISCHARGE DIAGNOSES:  1. End-stage osteoarthritis right hip, status post right total hip      arthroplasty.  2. Acute blood loss anemia, now improved.  3. Post-op hypovolemia, now resolved.  4. Constipation, now resolved.  5. Hypertension.  6. Osteoporosis.  7. History of breast cancer on the left, status post mastectomy.  8. History of benign colon polyps.   SURGICAL PROCEDURE:  On June 08, 2006,  [name] underwent a  right total hip arthroplasty by Dr. Claude Manges. Whitfield, assisted by Dr.  Rinaldo Ratel and Rexene Edison, [facility].  She had a Pinnacle 100 series  acetabular cup, size 54-mm with an apex Hall eliminator and Pinnacle  Marathon acetabular liner, +4, 10 degree liner, 36-mm inner diameter, 54-  mm outer diameter.  A AML large stature 160-mm length, 45-mm offset,  size 15 femoral stem placed within Articulese metal-on-metal femoral  head, 36-mm, -2 neck length, 1214 cone.   COMPLICATIONS:  None.   CONSULTS:  1. Pharmacy consult for Coumadin therapy June 08, 2006.  2. Case management and physical therapy consult June 09, 2006.  3. Occupational therapy consult June 10, 2006.   HISTORY OF PRESENT ILLNESS:  This 77 year old white female patient  presented to Dr. Cleophas Dunker with a history of chronic right hip pain  which has gotten worse over the last bit of time.  The pain is now  constant, severe, mainly achy  sensation with occasional sharp sensation  in the hip.  It has gotten worse recently, and is now causing her  difficulty sleeping.  She has failed conservative treatment, and x-rays  show end-stage arthritic changes.  Because of this, she is presenting  for a right hip replacement.   Richardson COURSE:  [name] tolerated her surgical procedure well,  without immediate postoperative complications.  She was transferred to  5000.  She was doing well that night with pain control, and was started  on Coumadin for DVT prophylaxis.  Post-op day #1:  T-max was 98.7.  Vitals were stable.  Hemoglobin was 8.6 with hematocrit of 25.3.  She  was symptomatic with a pulse of 90 and feeling a little dizzy and  lightheaded.  So, she was subsequently transfused, initially to be  transfused with 2 units of packed red blood cells.  There was some  difficulty with her urine output.  It was very low, so she only received  1 unit of blood and then received the Lasix, then got some fluid  challenges and the urine did seem to improve.  Her BUN and  creatinine  remained within normal limits, and she remained asymptomatic.   One post-op day #2, hemoglobin had only improved to 8.8 with a  hematocrit of 26.4, but her vitals were improved.  Blood pressure was  still a bit low at 92/53. O2 sat was good.  I&O was still low, so she  was given another fluid challenge, and then a second unit of blood which  she tolerated well.  Her urine output then improved drastically after  that time.  Her Foley was able to be discontinued that day.  Her leg was  neurovascularly intact, the incision well approximated, and it felt she  was improving.   One post-op day #3, she is feeling much better.  She is voiding without  difficulty.  She has had a bowel movement.  Hemoglobin is now 9.6,  hematocrit 27.9 and white count is 6.1.  T-max was 100.8.  She is now  afebrile at 98.4.  Vitals are stable.  It is felt she is ready for   transfer to the skilled facility today, and she will be transferred  there later today.   DISCHARGE INSTRUCTIONS:  Diet:  She can resume her regular pre-  hospitalization diet.   MEDICATIONS:  1. Crestor 10 mg p.o. q.p.m.  2. Enterra 130 mg p.o. q.p.m.  3. Lexapro 10 mg p.o. q.h.s.  4. Amlodipine 5 mg p.o. q.a.m.  5. Femara 2.5 mg p.o. q.a.m.  6. Miacardis 80 mg p.o. q.a.m.  7. Caltrate Plus D 100 mg 1 tablet p.o. b.i.d.  8. If the facility is able to do it, she can resume her Actonel 35 mg      p.o. q. Fridays.  If they are unable to insure adequate consumption      of the Actonel, then it can be held until she gets home.  9. Colace 100 mg p.o. b.i.d.  10.Senokot 1 tablet p.o. b.i.d. a.c.  11.Coumadin 1 tablet p.o. q. 6 p.m.  We need to dose this accordingly      to keep her INR right about 2 for deep venous thrombosis      prophylaxis.  She needs to be on this for 1 month after surgery.      Her most recent dose on March 27th was 5 mg.  12.Enema of choice, laxative of choice p.r.n. constipation.  13.Percocet 5/325 mg 1-2 tablets p.o. q. 4h.p.r.n. for pain.  14.Robaxin 500 mg 1-2 tablets p.o. q.6h. for spasms.   ACTIVITY:  She can be out of bed, partial weight bearing 50% or less on  the right leg with the use of a walker.  She is to have physical therapy  at the facility per total hip protocol.   WOUND CARE:  Please clean the right hip incision with Betadine q. day  and apply a dry dressing.  Please notify Dr. Cleophas Dunker of temp greater  than 101.5, chills, pain unrelieved by pain med's or foul-smelling  drainage from the wound.  She may shower after no drainage from the  wound for 2 days.   FOLLOWUP:  She needs to followup with Dr. Cleophas Dunker in our office on  either Wednesday, April 7th or Friday, June 23, 2006, and you need to  call [phone] to set up that appointment.   LABORATORY DATA:  Chest x-ray in January of 2008 showed acute abnormalities.  Repeat chest x-ray done  on March 21st showed  cardiomegaly, mild chronic interstitial lung disease and stable  compression fractures in the thoracic spine.  X-ray taken of the right  hip on March 25th showed satisfactory right hip replacement.   Hemoglobin and hematocrit range from 12.2 and 36.2 on June 04, 2006 to  a low of 8.6 and 25.3 on 26th, to 9.6 and 27.9 on 28th.  White count  went from 4.9 on the 21st to a high of 8.7 on the 27th to 6.1 on the  28th.  Platelet count remained within normal limits.   On March 21st, PT was 13.1.  INR was 1.  March 28th, PT was 21.4.  INR  was 1.8.   Glucose ranged from 90 on the 21st to a high of 133 on the 26th to 113  on the 28th.  Creatinine went to a high of 1.31 on the 26th, but then  was within normal limits.  On March 26th, her GFR was 66 and then on the  27th it was 45.  All other laboratory studies were within normal limits      Lucienne Minks E. Duffy, P.A.      Claude Manges. Cleophas Dunker, M.D.  Electronically Signed    KED/MEDQ  D:  06/11/2006  T:  06/11/2006  Job:  [id number]   cc:   Delaney Meigs, M.D.

## 2010-08-01 NOTE — Op Note (Signed)
[place]  Patient:    [name], [name] Visit Number: [id number] MRN: [id number]          Service Type: DSU Location: [id number] Attending Physician:  Charlton Haws Dictated by:   Currie Paris, M.D. Proc. Date: 09/21/01 Admit Date:  09/21/2001 Discharge Date: 09/22/2001   CC:         Delaney Meigs, M.D.   Operative Report  ACCOUNT NO. [id number]. CCS [id number].  PREOPERATIVE DIAGNOSIS:  Carcinoma, left breast upper inner quadrant.  POSTOPERATIVE DIAGNOSIS:  Carcinoma, left breast upper inner quadrant.  PROCEDURE:  Left total mastectomy with blue dye injection and sentinel node dissection.  SURGEON:  Currie Paris, M.D.  ASSISTANT:  Gita Kudo, M.D.  ANESTHESIA:  General endotracheal.  CLINICAL HISTORY:  This patient is a 77 year old lady recently found to have a left breast mass and biopsy-proved carcinoma.  DESCRIPTION OF PROCEDURE:  The patient was seen in the holding area, and she had no further questions regarding her surgery.  She had identified and we marked the left side as the operative side, and she had already been injected on the left side with her radioisotope.  The patient was taken to the operating room.  After satisfactory general endotracheal anesthesia had been obtained, the breast was prepped and draped as a sterile field.  Blue dye was injected subareolarly and massaged in for a few minutes.  An elliptical incision was outlined and the superior half of the incision made with skin flaps raised medially to the sternum, superiorly toward the clavicle, and then out into the axilla.  Prior to making the incision I had marked an area on the skin which we got high counts on, indicating the approximate area of the sentinel node.  When I got the skin elevated over this area, I was able to see a lymphatic leaving the breast and entering the axilla.  I traced this down and using both  the NeoProbe plus visual direction from the lymphatic identified a blue node, which I grasped with a Babcock.  It had counts of around 600, and it was excised with the cautery, clipping the small lymphatic as it entered.  Using the NeoProbe I saw another hot area a little higher up and traced this out, saw another blue node, which was also grasped with a Babcock and had counts of around 300, and it was excised.  Upon doing this, there were basically 0 counts in the entire axilla, and I felt that we had identified at least by the NeoProbe all the activity.  A little further dissection revealed no blue nodes, and there were no palpable abnormal nodes.  Of note is that I used the NeoProbe over the sternal area and saw no evidence of any hot counts suggesting nodes in that area.  Attention was returned to the breast and the inferior incision made and skin flap raised medially to the sternum, inferiorly to the inframammary fold, and laterally to the latissimus, and we traced up along the latissimus laterally to try to join the dissection up toward the axilla.  The breast was retracted laterally and removed with the cautery from the underlying muscle, starting medially and superiorly and working laterally and inferiorly.  As I got to the edge of the pectoralis, I was able to track the breast tissue up and used the cautery to divide the remaining attachments as well as a few vessels which were clipped.  Once this was  out, we irrigated and made sure everything appeared to be dry, saw no evidence of any bleeding after we had gotten all the small oozing points cauterized.  I put two drains in using simple stab wounds in the inferior flap using 19 Blake drains, placed one into the axilla and one along the superior flap over the muscle.  No blood had accumulated during the period of time we were placing the drains, and I felt satisfied that everything was dry.  The wound was then closed with  staples.  Sterile dressings were applied.  The patient tolerated the procedure well.  There were no operative complications. All counts were correct. Dictated by:   Currie Paris, M.D. Attending Physician:  Charlton Haws DD:  09/21/01 TD:  09/24/01 Job: [id number]

## 2010-08-01 NOTE — Op Note (Signed)
NAMEJELINA, PAULSEN         ACCOUNT NO.:  [id number]   MEDICAL RECORD NO.:  [id number]          PATIENT TYPE:  AMB   LOCATION:  SDS                          FACILITY:  [facility]   PHYSICIAN:  Claude Manges. Whitfield, M.D.DATE OF BIRTH:  [birth year]   DATE OF PROCEDURE:  06/08/2006  DATE OF DISCHARGE:                               OPERATIVE REPORT   PREOPERATIVE DIAGNOSIS:  End-stage osteoarthritis right hip.   POSTOPERATIVE DIAGNOSIS:  End-stage osteoarthritis right hip.   PROCEDURE:  Right total hip replacement.   SURGEON:  Claude Manges. Cleophas Dunker, M.D.   ASSISTANTS:  1. Lenard Galloway. Chaney Malling, M.D.  2. Richardean Canal, P.A.-C.   ANESTHESIA:  General orotracheal.   COMPLICATIONS:  None.   COMPONENTS:  DePuy AML 15 mm large stature femoral component, a 36 mm  outer diameter hip ball with a -2 neck length, 54 mm outer diameter  Pinnacle 100 Series acetabular cup with an apex hole eliminator, and the  Pinnacle Marathon +4 10-degrees liner.  All were press fit.   PROCEDURE:  With the patient comfortable on the operating room table and  under general orotracheal anesthesia, the nursing staff inserted a Foley  catheter.  The patient was then placed in the lateral decubitus position  with the right side up and secured to the operating room table with the  Innomed hip system.   The right hip was then prepped with Betadine scrub and DuraPrep from the  iliac crest to the ankle.  Sterile draping was performed.   A routine southern incision was utilized, and via sharp dissection  carried down to the subcutaneous tissue.  There was abundant adipose  tissue which made the procedure technically difficult, and this was  dissected with the Bovie.  Care was taken to provide hemostasis.  Self-  retaining retractors were inserted. The iliotibial band was identified  and then incised along the incision. Retractors were placed more deeply.  With the hip internally rotated, the short external rotators  were  identified after resecting adipose tissue.  Tendinous structures were  tagged with 0 Ethibond suture.  The capsule was then incised.  There was  a large effusion and abundant synovial tissue.  The incision was made  along the femoral neck and head.  The head was then easily dislocated  posteriorly. Using the AML calcar guide, an osteotomy was made on the  calcar, and the head was then delivered from the wound.  There were  large areas of complete articular cartilage loss, cyst formation, and  beefy red synovium. The piriformis fossa was identified.  A starter hole  was then made, followed by the canal finder.  Reaming was performed to  14.5 mm to accept a 15 mm prosthesis.  Rasping was then performed  sequentially to 15 mm small stature, and I felt that the large stature  would be even a better fit, and then we went back and rasped with a 13.5  large stature and a 15 large stature, and this had an excellent fit.  A  calcar reamer was used to obtain the correct calcar angle.  The  osteotomy level was about a fingerbreadth  proximal to the lesser  trochanter.   Retractor was then placed about the acetabulum.  Synovectomy was  performed. Labrum was excised.  Reaming was performed sequentially to 53  mm to accept a 54 mm prosthesis, which we had templated preoperatively.  We had very nice reaming. The wall was completely intact.  We had very  nice bleeding.  We trialed a 52 with complete seating and nice rim fit,  and a 54 would not completely seat.   We then impacted the 100 Series AML Pinnacle cup, inserted the trial +4  liner with a 36 mm outer diameter corresponding to the size of the head,  reinserted the femoral rasp, and we tried several neck lengths.  We felt  that the leg lengths were symmetrical with a -2 neck, but there was some  instability posteriorly typically with adduction and internal rotation,  so we rearranged the position of the acetabulum in slightly more flexion   and felt that now it was perfectly stable.  There was no toggling.  We  could not dislocate.   All the trial components were removed.  We had a very nice seating of  the acetabulum.  The acetabulum was irrigated, the apex hole eliminator  inserted, followed by the +4 Pinnacle Marathon polyethylene. The wound  was again irrigated. The femoral component was then impacted flush on  the calcar.  We dried the Morse taper neck and then applied the 36 mm  outer diameter hip ball with a -2 neck length.  The entire construct was  then reduced, and through full range of motion we had perfect stability  and no toggling.  The wound was then irrigated with saline solution.  The capsule was closed anatomically with interrupted #1 Ethibond.  The  short external rotators were closed with a similar material. Sciatic  nerve was identified throughout the procedure and protected.   The iliotibial band was then closed with a running 0 Vicryl.  The  adipose tissue and subcutaneous were closed in numerous layers with 0  and 2-0 Vicryl. Skin closed with skin clips.  Sterile bulky dressing was  applied.   The patient tolerated the procedure without complications.      Claude Manges. Cleophas Dunker, M.D.  Electronically Signed     PWW/MEDQ  D:  06/08/2006  T:  06/08/2006  Job:  [id number]

## 2010-08-15 IMAGING — CR DG CHEST 1V PORT
1 series · 1 of 1 positions shown · non-contrast
Comparison: Chest radiograph 09/10/1998

CLINICAL DATA: Acute abdominal aortic aneurysm

PORTABLE CHEST - 1 VIEW

[view not recorded]
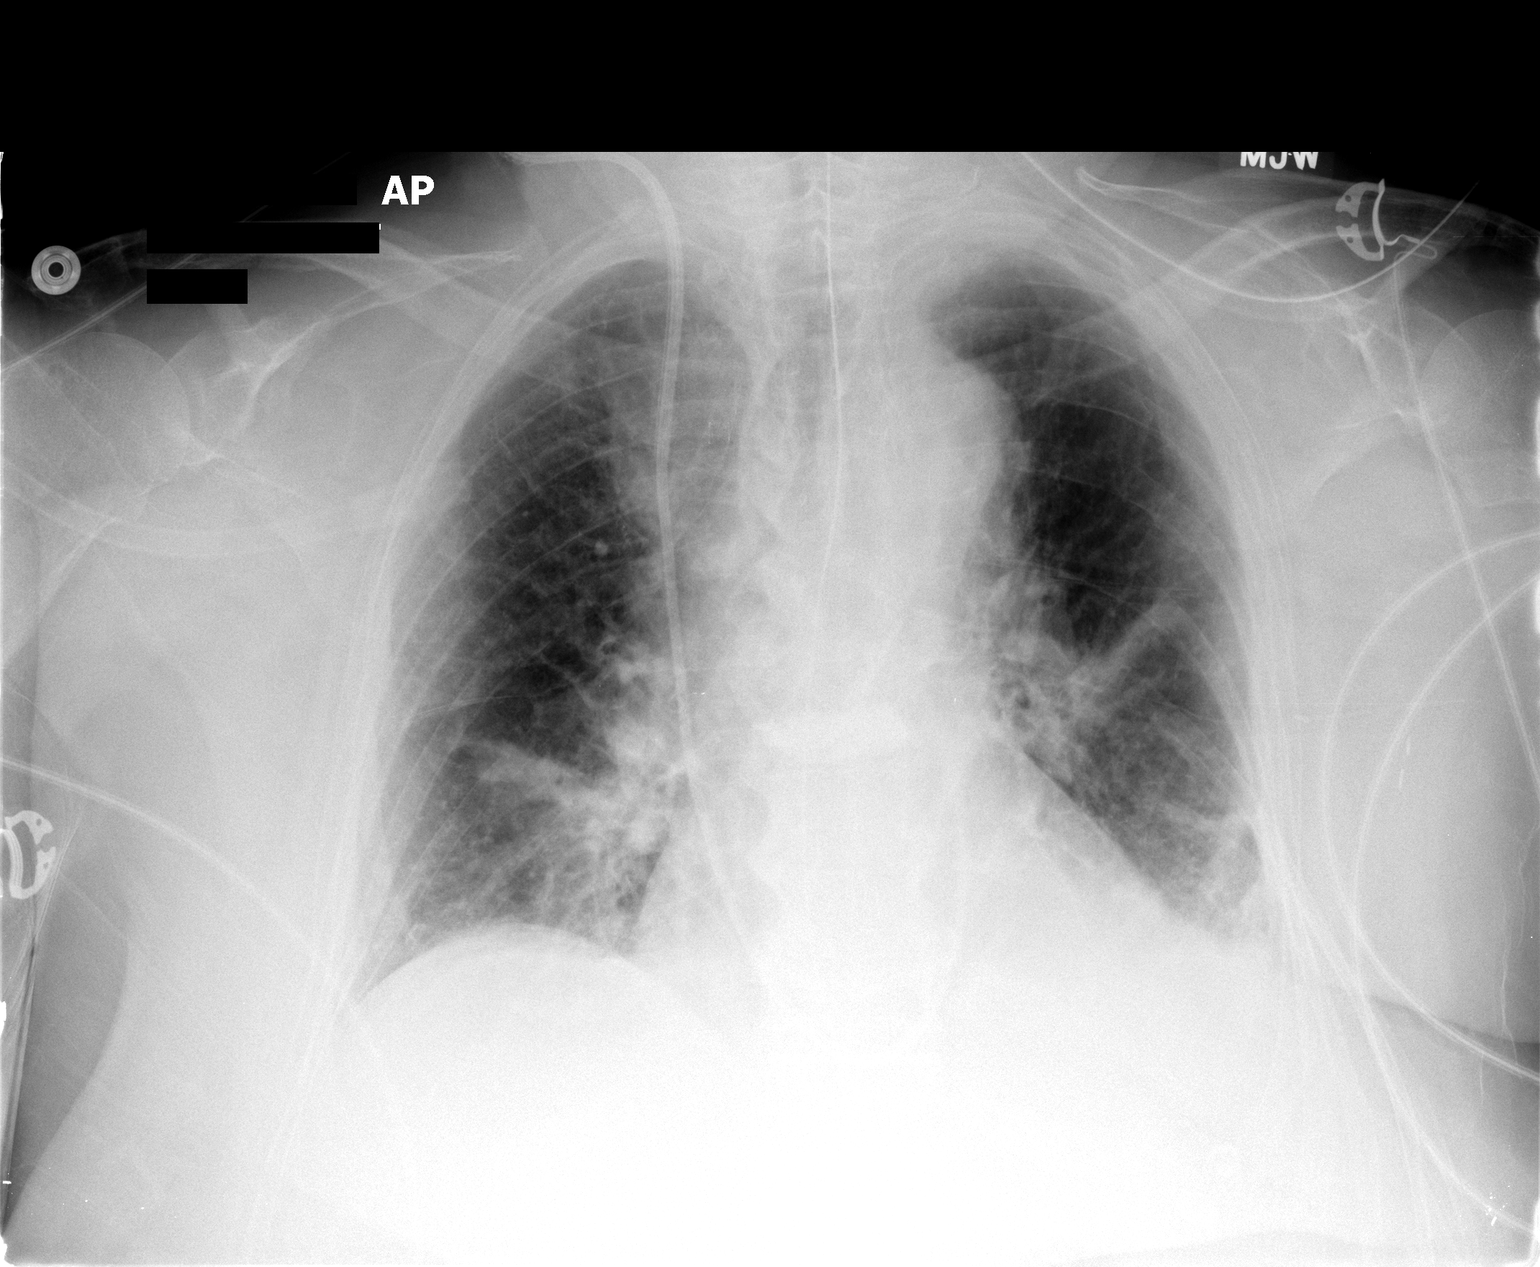

[1 of 1 positions shown; findings below may reference images not displayed]

FINDINGS: Interval removal endotracheal tube.  Swan-Ganz catheter
and NG tube remain.  There is increase in left pleural effusion.
Central venous congestion is unchanged.  Upper lungs are clear.
IMPRESSION: 1.  Interval extubation.
2.  Increase in left effusion.

## 2010-10-16 ENCOUNTER — Ambulatory Visit
Admission: RE | Admit: 2010-10-16 | Discharge: 2010-10-16 | Disposition: A | Discharge status: Home / Self Care | Payer: Medicare Other | Source: Ambulatory Visit | Attending: Vascular Surgery | Admitting: Vascular Surgery

## 2010-10-16 ENCOUNTER — Ambulatory Visit (INDEPENDENT_AMBULATORY_CARE_PROVIDER_SITE_OTHER): Payer: Medicare Other | Admitting: Vascular Surgery

## 2010-10-16 ENCOUNTER — Encounter: Payer: Self-pay | Admitting: Vascular Surgery

## 2010-10-16 VITALS — BP 143/79 | HR 90 | Resp 22 | Ht 64.0 in | Wt 204.0 lb

## 2010-10-16 DIAGNOSIS — I714 Abdominal aortic aneurysm, without rupture, unspecified: Secondary | ICD-10-CM

## 2010-10-16 DIAGNOSIS — I728 Aneurysm of other specified arteries: Secondary | ICD-10-CM

## 2010-10-16 MED ORDER — IOHEXOL 350 MG/ML SOLN
100.0000 mL | Freq: Once | INTRAVENOUS | Status: AC | PRN
  Administered 2010-10-16: 100 mL via INTRAVENOUS

## 2010-10-16 NOTE — Progress Notes (Signed)
F/U AAA  CT PRIOR

## 2010-10-16 NOTE — Progress Notes (Signed)
Subjective:     Patient ID: [name], female   DOB: [birth year], 77 y.o.   MRN: [id number]  HPIPt is s/p open AAA repair in June 2011.   She denies abdominal or back pain.  She has developed a small hernia but this is not painful to her.  She is having normal bowel movements.    Chronic medical problems include elevated cholesterol, hypertension, breast cancer. All currently controlled and followed by Dr. Lysbeth Galas.    Review of Systems  Pulmonary no shortness of breath Cardiac no chest pain    Objective:   Physical Exam Blood pressure 143/79, pulse 90, resp. rate 22, height 5\' 4"  (1.626 m), weight 204 lb (92.534 kg).  Neck- no bruit, 2+ carotid  Chest - CTA Cardiac-RRR no murmur  Abdomen- well healed incision, 3-4 cm hernia defect to right of umblicus reducible, no palpable mass  CTA abdomen-no recurrent AAA, complete thrombosis of splenic aneurysm, hernia as above     Assessment:     S/P Open AAA repair, with small asymptomatic incisional hernia, splenic artery aneurysm thrombosed    Plan:     General Surgery evaluation for incisional hernia if pain or enlargement  No further followup for aneurysms needed, Follow up PRN

## 2010-11-20 ENCOUNTER — Other Ambulatory Visit (HOSPITAL_COMMUNITY): Payer: Self-pay | Admitting: Oncology

## 2010-11-20 ENCOUNTER — Encounter (HOSPITAL_BASED_OUTPATIENT_CLINIC_OR_DEPARTMENT_OTHER): Payer: Medicare Other | Admitting: Oncology

## 2010-11-20 DIAGNOSIS — M810 Age-related osteoporosis without current pathological fracture: Secondary | ICD-10-CM

## 2010-11-20 DIAGNOSIS — C50919 Malignant neoplasm of unspecified site of unspecified female breast: Secondary | ICD-10-CM

## 2010-11-20 LAB — COMPREHENSIVE METABOLIC PANEL
ALT: 15 U/L (ref 0–35)
AST: 21 U/L (ref 0–37)
Albumin: 3.9 g/dL (ref 3.5–5.2)
BUN: 20 mg/dL (ref 6–23)
CO2: 30 mEq/L (ref 19–32)
Calcium: 10.9 mg/dL — ABNORMAL HIGH (ref 8.4–10.5)
Chloride: 101 mEq/L (ref 96–112)
Potassium: 4 mEq/L (ref 3.5–5.3)

## 2010-11-20 LAB — CBC WITH DIFFERENTIAL/PLATELET
Basophils Absolute: 0 10*3/uL (ref 0.0–0.1)
EOS%: 2.9 % (ref 0.0–7.0)
Eosinophils Absolute: 0.2 10*3/uL (ref 0.0–0.5)
HCT: 37 % (ref 34.8–46.6)
HGB: 12.7 g/dL (ref 11.6–15.9)
MCH: 30.1 pg (ref 25.1–34.0)
MONO#: 0.5 10*3/uL (ref 0.1–0.9)
NEUT#: 3.2 10*3/uL (ref 1.5–6.5)
RDW: 14.3 % (ref 11.2–14.5)
WBC: 5.2 10*3/uL (ref 3.9–10.3)
lymph#: 1.4 10*3/uL (ref 0.9–3.3)

## 2010-11-20 LAB — LACTATE DEHYDROGENASE: LDH: 171 U/L (ref 94–250)

## 2010-12-08 ENCOUNTER — Encounter (INDEPENDENT_AMBULATORY_CARE_PROVIDER_SITE_OTHER): Payer: Self-pay | Admitting: Surgery

## 2011-03-20 ENCOUNTER — Emergency Department (HOSPITAL_COMMUNITY): Payer: Medicare Other

## 2011-03-20 ENCOUNTER — Encounter (HOSPITAL_COMMUNITY): Payer: Self-pay

## 2011-03-20 ENCOUNTER — Inpatient Hospital Stay (HOSPITAL_COMMUNITY)
Admission: EM | Admit: 2011-03-20 | Discharge: 2011-03-25 | DRG: 184 | Disposition: A | Discharge status: Skilled Nursing Facility | Payer: Medicare Other | Source: Ambulatory Visit | Attending: General Surgery | Admitting: General Surgery

## 2011-03-20 DIAGNOSIS — E785 Hyperlipidemia, unspecified: Secondary | ICD-10-CM | POA: Diagnosis present

## 2011-03-20 DIAGNOSIS — Z882 Allergy status to sulfonamides status: Secondary | ICD-10-CM

## 2011-03-20 DIAGNOSIS — S82109A Unspecified fracture of upper end of unspecified tibia, initial encounter for closed fracture: Secondary | ICD-10-CM | POA: Diagnosis present

## 2011-03-20 DIAGNOSIS — S52539A Colles' fracture of unspecified radius, initial encounter for closed fracture: Secondary | ICD-10-CM | POA: Diagnosis present

## 2011-03-20 DIAGNOSIS — S2249XA Multiple fractures of ribs, unspecified side, initial encounter for closed fracture: Principal | ICD-10-CM | POA: Diagnosis present

## 2011-03-20 DIAGNOSIS — S52501A Unspecified fracture of the lower end of right radius, initial encounter for closed fracture: Secondary | ICD-10-CM | POA: Diagnosis present

## 2011-03-20 DIAGNOSIS — S2242XA Multiple fractures of ribs, left side, initial encounter for closed fracture: Secondary | ICD-10-CM | POA: Diagnosis present

## 2011-03-20 DIAGNOSIS — S2243XA Multiple fractures of ribs, bilateral, initial encounter for closed fracture: Secondary | ICD-10-CM

## 2011-03-20 DIAGNOSIS — S2241XA Multiple fractures of ribs, right side, initial encounter for closed fracture: Secondary | ICD-10-CM | POA: Diagnosis present

## 2011-03-20 DIAGNOSIS — S52509A Unspecified fracture of the lower end of unspecified radius, initial encounter for closed fracture: Secondary | ICD-10-CM

## 2011-03-20 DIAGNOSIS — S82202A Unspecified fracture of shaft of left tibia, initial encounter for closed fracture: Secondary | ICD-10-CM

## 2011-03-20 DIAGNOSIS — S82009A Unspecified fracture of unspecified patella, initial encounter for closed fracture: Secondary | ICD-10-CM | POA: Diagnosis present

## 2011-03-20 DIAGNOSIS — M810 Age-related osteoporosis without current pathological fracture: Secondary | ICD-10-CM | POA: Diagnosis present

## 2011-03-20 DIAGNOSIS — Z853 Personal history of malignant neoplasm of breast: Secondary | ICD-10-CM

## 2011-03-20 DIAGNOSIS — Z96649 Presence of unspecified artificial hip joint: Secondary | ICD-10-CM

## 2011-03-20 DIAGNOSIS — Z79899 Other long term (current) drug therapy: Secondary | ICD-10-CM

## 2011-03-20 DIAGNOSIS — Z888 Allergy status to other drugs, medicaments and biological substances status: Secondary | ICD-10-CM

## 2011-03-20 DIAGNOSIS — Z832 Family history of diseases of the blood and blood-forming organs and certain disorders involving the immune mechanism: Secondary | ICD-10-CM

## 2011-03-20 DIAGNOSIS — Z860100 Personal history of colon polyps, unspecified: Secondary | ICD-10-CM

## 2011-03-20 DIAGNOSIS — I10 Essential (primary) hypertension: Secondary | ICD-10-CM | POA: Diagnosis present

## 2011-03-20 DIAGNOSIS — S82143A Displaced bicondylar fracture of unspecified tibia, initial encounter for closed fracture: Secondary | ICD-10-CM | POA: Diagnosis present

## 2011-03-20 DIAGNOSIS — S82209A Unspecified fracture of shaft of unspecified tibia, initial encounter for closed fracture: Secondary | ICD-10-CM

## 2011-03-20 DIAGNOSIS — S82002A Unspecified fracture of left patella, initial encounter for closed fracture: Secondary | ICD-10-CM | POA: Diagnosis present

## 2011-03-20 DIAGNOSIS — S5290XA Unspecified fracture of unspecified forearm, initial encounter for closed fracture: Secondary | ICD-10-CM

## 2011-03-20 DIAGNOSIS — M129 Arthropathy, unspecified: Secondary | ICD-10-CM | POA: Diagnosis present

## 2011-03-20 LAB — POCT I-STAT, CHEM 8
BUN: 19 mg/dL (ref 6–23)
Calcium, Ion: 1.17 mmol/L (ref 1.12–1.32)
Creatinine, Ser: 0.9 mg/dL (ref 0.50–1.10)
Glucose, Bld: 136 mg/dL — ABNORMAL HIGH (ref 70–99)
Hemoglobin: 13.3 g/dL (ref 12.0–15.0)
Sodium: 140 mEq/L (ref 135–145)
TCO2: 26 mmol/L (ref 0–100)

## 2011-03-20 LAB — DIFFERENTIAL
Basophils Absolute: 0 10*3/uL (ref 0.0–0.1)
Eosinophils Absolute: 0.1 10*3/uL (ref 0.0–0.7)
Eosinophils Relative: 1 % (ref 0–5)

## 2011-03-20 LAB — COMPREHENSIVE METABOLIC PANEL
ALT: 32 U/L (ref 0–35)
AST: 51 U/L — ABNORMAL HIGH (ref 0–37)
Calcium: 9.6 mg/dL (ref 8.4–10.5)
Creatinine, Ser: 0.91 mg/dL (ref 0.50–1.10)
GFR calc Af Amer: 66 mL/min — ABNORMAL LOW (ref >90)
Glucose, Bld: 141 mg/dL — ABNORMAL HIGH (ref 70–99)
Sodium: 138 mEq/L (ref 135–145)
Total Protein: 7.2 g/dL (ref 6.0–8.3)

## 2011-03-20 LAB — CBC
MCH: 29.7 pg (ref 26.0–34.0)
MCV: 88 fL (ref 78.0–100.0)
Platelets: 186 10*3/uL (ref 150–400)
RDW: 13.7 % (ref 11.5–15.5)

## 2011-03-20 LAB — URINALYSIS, ROUTINE W REFLEX MICROSCOPIC
Bilirubin Urine: NEGATIVE
Nitrite: NEGATIVE
Protein, ur: 30 mg/dL — AB
Specific Gravity, Urine: <1.005 — ABNORMAL LOW (ref 1.005–1.030)
Urobilinogen, UA: 0.2 mg/dL (ref 0.0–1.0)

## 2011-03-20 LAB — PROTIME-INR
INR: 0.99 (ref 0.00–1.49)
Prothrombin Time: 13.3 seconds (ref 11.6–15.2)

## 2011-03-20 LAB — TROPONIN I: Troponin I: <0.3 ng/mL (ref <0.30)

## 2011-03-20 MED ORDER — ONDANSETRON HCL 4 MG/2ML IJ SOLN
INTRAMUSCULAR | Status: AC
  Filled 2011-03-20: qty 2

## 2011-03-20 MED ORDER — IOHEXOL 300 MG/ML  SOLN
100.0000 mL | Freq: Once | INTRAMUSCULAR | Status: AC | PRN
  Administered 2011-03-20: 100 mL via INTRAVENOUS

## 2011-03-20 MED ORDER — ONDANSETRON HCL 4 MG/2ML IJ SOLN
4.0000 mg | Freq: Once | INTRAMUSCULAR | Status: AC
  Administered 2011-03-20: 4 mg via INTRAVENOUS

## 2011-03-20 MED ORDER — FENTANYL CITRATE 0.05 MG/ML IJ SOLN
50.0000 ug | Freq: Once | INTRAMUSCULAR | Status: AC
  Administered 2011-03-20: 50 ug via INTRAVENOUS
  Filled 2011-03-20: qty 2

## 2011-03-20 MED ORDER — MORPHINE SULFATE 4 MG/ML IJ SOLN
4.0000 mg | Freq: Once | INTRAMUSCULAR | Status: AC
  Administered 2011-03-20: 4 mg via INTRAVENOUS
  Filled 2011-03-20: qty 1

## 2011-03-20 NOTE — ED Notes (Addendum)
At 19:50 pt became bradycardic and hypotensive. NS titrated to a rate 999 ml/hr. At 19:55 pt's pressure responded well, systolic >100 since. Heart rate still 55. Pt remains alert x 4. MD notified.
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
## 2011-03-20 NOTE — ED Notes (Signed)
The pt returned from xray c/o pain in her rt knee and rt forearm.  Ortho tech called  To see.
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
## 2011-03-21 ENCOUNTER — Inpatient Hospital Stay (HOSPITAL_COMMUNITY): Payer: Medicare Other

## 2011-03-21 ENCOUNTER — Encounter (HOSPITAL_COMMUNITY): Payer: Self-pay | Admitting: Orthopedic Surgery

## 2011-03-21 DIAGNOSIS — I10 Essential (primary) hypertension: Secondary | ICD-10-CM

## 2011-03-21 LAB — CBC
Hemoglobin: 10.9 g/dL — ABNORMAL LOW (ref 12.0–15.0)
MCH: 29.1 pg (ref 26.0–34.0)
MCHC: 32.9 g/dL (ref 30.0–36.0)
RDW: 13.9 % (ref 11.5–15.5)

## 2011-03-21 LAB — BASIC METABOLIC PANEL
BUN: 12 mg/dL (ref 6–23)
Calcium: 8.8 mg/dL (ref 8.4–10.5)
GFR calc Af Amer: >90 mL/min (ref >90)
GFR calc non Af Amer: 79 mL/min — ABNORMAL LOW (ref >90)
Glucose, Bld: 287 mg/dL — ABNORMAL HIGH (ref 70–99)
Potassium: 5 mEq/L (ref 3.5–5.1)
Sodium: 140 mEq/L (ref 135–145)

## 2011-03-21 MED ORDER — PROMETHAZINE HCL 25 MG/ML IJ SOLN
12.5000 mg | INTRAMUSCULAR | Status: DC | PRN
  Filled 2011-03-21: qty 1

## 2011-03-21 MED ORDER — FENTANYL CITRATE 0.05 MG/ML IJ SOLN: 25.0000 ug | INTRAMUSCULAR | Status: DC | PRN

## 2011-03-21 MED ORDER — METOPROLOL SUCCINATE ER 25 MG PO TB24
25.0000 mg | ORAL_TABLET | Freq: Every day | ORAL | Status: DC
  Administered 2011-03-21 – 2011-03-24 (×4): 25 mg via ORAL
  Filled 2011-03-21 (×5): qty 1

## 2011-03-21 MED ORDER — MORPHINE SULFATE 2 MG/ML IJ SOLN
4.0000 mg | INTRAMUSCULAR | Status: DC | PRN
Start: 2011-03-21 — End: 2011-03-23
  Administered 2011-03-21: 2 mg via INTRAVENOUS
  Administered 2011-03-21: 4 mg via INTRAVENOUS
  Administered 2011-03-21 (×2): 2 mg via INTRAVENOUS
  Administered 2011-03-21: 4 mg via INTRAVENOUS
  Filled 2011-03-21 (×2): qty 2
  Filled 2011-03-21 (×3): qty 1

## 2011-03-21 MED ORDER — KCL IN DEXTROSE-NACL 20-5-0.9 MEQ/L-%-% IV SOLN
INTRAVENOUS | Status: DC
  Filled 2011-03-21 (×5): qty 1000

## 2011-03-21 MED ORDER — KCL IN DEXTROSE-NACL 20-5-0.9 MEQ/L-%-% IV SOLN
INTRAVENOUS | Status: DC
  Administered 2011-03-21 – 2011-03-22 (×3): via INTRAVENOUS
  Filled 2011-03-21 (×6): qty 1000

## 2011-03-21 MED ORDER — MORPHINE SULFATE 2 MG/ML IJ SOLN
2.0000 mg | Freq: Once | INTRAMUSCULAR | Status: AC
  Administered 2011-03-21: 2 mg via INTRAVENOUS
  Filled 2011-03-21: qty 1

## 2011-03-21 MED ORDER — ONDANSETRON HCL 4 MG/2ML IJ SOLN: 4.0000 mg | Freq: Once | INTRAMUSCULAR | Status: DC

## 2011-03-21 MED ORDER — ONDANSETRON HCL 4 MG/2ML IJ SOLN
4.0000 mg | Freq: Once | INTRAMUSCULAR | Status: AC
  Administered 2011-03-21: 4 mg via INTRAVENOUS
  Filled 2011-03-21: qty 2

## 2011-03-21 MED ORDER — ONDANSETRON HCL 4 MG/2ML IJ SOLN
4.0000 mg | Freq: Four times a day (QID) | INTRAMUSCULAR | Status: DC | PRN
  Administered 2011-03-21: 4 mg via INTRAVENOUS
  Filled 2011-03-21: qty 2

## 2011-03-21 MED ORDER — AMLODIPINE BESYLATE 5 MG PO TABS
5.0000 mg | ORAL_TABLET | Freq: Every day | ORAL | Status: DC
  Administered 2011-03-21 – 2011-03-24 (×4): 5 mg via ORAL
  Filled 2011-03-21 (×5): qty 1

## 2011-03-21 MED ORDER — PANTOPRAZOLE SODIUM 40 MG IV SOLR
40.0000 mg | Freq: Every day | INTRAVENOUS | Status: DC
  Administered 2011-03-21: 40 mg via INTRAVENOUS
  Filled 2011-03-21 (×2): qty 40

## 2011-03-21 MED ORDER — ONDANSETRON HCL 4 MG PO TABS
4.0000 mg | ORAL_TABLET | Freq: Four times a day (QID) | ORAL | Status: DC | PRN
  Administered 2011-03-22: 4 mg via ORAL
  Filled 2011-03-21: qty 1

## 2011-03-21 MED ORDER — PANTOPRAZOLE SODIUM 40 MG PO TBEC
40.0000 mg | DELAYED_RELEASE_TABLET | Freq: Every day | ORAL | Status: DC
  Administered 2011-03-22: 40 mg via ORAL
  Filled 2011-03-21 (×2): qty 1

## 2011-03-21 MED ORDER — HYDROMORPHONE HCL PF 1 MG/ML IJ SOLN
1.0000 mg | INTRAMUSCULAR | Status: DC | PRN
  Administered 2011-03-21 – 2011-03-22 (×6): 1 mg via INTRAVENOUS
  Filled 2011-03-21 (×6): qty 1
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
## 2011-03-21 NOTE — Progress Notes (Signed)
Awaiting Ortho decision on knee after CT scan.  Neck cleared.  Hand surgery to eval wrist.  Wilmon Arms. Corliss Skains, MD, [facility]  03/21/2011 10:13 AM

## 2011-03-21 NOTE — ED Notes (Signed)
The pt remains alert c/o very much pain in her lt knee.  Ice pacj=k placed and pain and nausea med given
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
## 2011-03-21 NOTE — Consult Note (Signed)
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 Neurologic: Grossly normal Incision/Wound: na  Assessment/Plan Right comminuted intraarticular distal radius fracture.  Current alignment acceptable, but fracture is comminuted with intraarticular step-off present.  May benefit from operative fixation.  Discussed with patient non operative and operative treatment.  Would plan for fixation as outpatient after d/c if patient chooses this course of treatment.  Quinzell Malcomb R 03/21/2011, 11:24 AM

## 2011-03-22 MED ORDER — OXYCODONE-ACETAMINOPHEN 5-325 MG PO TABS
2.0000 | ORAL_TABLET | Freq: Four times a day (QID) | ORAL | Status: DC | PRN
  Administered 2011-03-22 – 2011-03-23 (×4): 2 via ORAL
  Administered 2011-03-23: 1 via ORAL
  Filled 2011-03-22 (×4): qty 2

## 2011-03-22 MED ORDER — METHOCARBAMOL 500 MG PO TABS
500.0000 mg | ORAL_TABLET | Freq: Four times a day (QID) | ORAL | Status: DC | PRN
  Administered 2011-03-23 – 2011-03-25 (×6): 500 mg via ORAL
  Filled 2011-03-22 (×6): qty 1

## 2011-03-22 NOTE — Progress Notes (Signed)
Patient ID: [name], female   DOB: 01-21-1927, 78 y.o.   MRN: [id number] I reviewed the CT scan of her right knee and discussed it with her last evening.  She is currently now in a hinged knee brace.  Regardless of surgery or not, she will need to remain non-weight bearing on her left leg for 8-12 weeks.  The CT show decent alignment overall and its borderline whether or not we can just watch this for now and let her soft-tissue swelling subside and allow her to start PT/OT.  Exam: Left knee and leg with some decrease in swelling.  Compartments soft.  Foot well perfused with normal motor and sensory function.  Plan: Will order PT/OT

## 2011-03-22 NOTE — Progress Notes (Signed)
Physical Therapy Treatment Patient Details Name: [name] MRN: [id number] DOB: Aug 13, 1926 Today's Date: 03/22/2011  PT Assessment/Plan  PT - Assessment/Plan PT Frequency: Min 5X/week Follow Up Recommendations: Skilled nursing facility Equipment Recommended: Defer to next venue PT Goals  Acute Rehab PT Goals PT Goal Formulation: With patient Time For Goal Achievement: 2 weeks Pt will go Supine/Side to Sit: with mod assist PT Goal: Supine/Side to Sit - Progress: Progressing toward goal Pt will go Sit to Supine/Side: with mod assist PT Goal: Sit to Supine/Side - Progress: Progressing toward goal Pt will go Sit to Stand: with mod assist PT Goal: Sit to Stand - Progress: Progressing toward goal Pt will go Stand to Sit: with mod assist PT Goal: Stand to Sit - Progress: Progressing toward goal Pt will Transfer Bed to Chair/Chair to Bed: with mod assist PT Transfer Goal: Bed to Chair/Chair to Bed - Progress: Progressing toward goal Pt will Stand: with min assist;with bilateral upper extremity support;3 - 5 min PT Goal: Stand - Progress: Progressing toward goal  PT Treatment Precautions/Restrictions  Restrictions Weight Bearing Restrictions: Yes RUE Weight Bearing: Non weight bearing LLE Weight Bearing: Non weight bearing Mobility (including Balance) Bed Mobility Bed Mobility: Yes Supine to Sit: 1: +2 Total assist;Patient percentage (comment) (pt < 25%) Supine to Sit Details (indicate cue type and reason): 2nd person assist was utilized to support LLE Sit to Supine - Right: 1: +2 Total assist (pt < 25%)  Posture/Postural Control Posture/Postural Control: No significant limitations Balance Balance Assessed: Yes Static Sitting Balance Static Sitting - Balance Support: Feet supported Static Sitting - Level of Assistance: 5: Stand by assistance Static Sitting - Comment/# of Minutes: pt sat EOB x 8 minutes with LLE supported in elevated position Exercise    End of  Session PT - End of Session Equipment Utilized During Treatment: Other (comment) (hinged knee brace L) Activity Tolerance: Patient tolerated treatment well Patient left: in bed;with call bell in reach;with family/visitor present General Behavior During Session: [facility] for tasks performed Cognition: [facility] for tasks performed  Ilda Foil 03/22/2011, 2:32 PM   Aida Raider, PT  Office # [phone] Pager [phone]
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
## 2011-03-23 ENCOUNTER — Inpatient Hospital Stay (HOSPITAL_COMMUNITY): Payer: Medicare Other

## 2011-03-23 DIAGNOSIS — S2241XA Multiple fractures of ribs, right side, initial encounter for closed fracture: Secondary | ICD-10-CM | POA: Diagnosis present

## 2011-03-23 DIAGNOSIS — S52501A Unspecified fracture of the lower end of right radius, initial encounter for closed fracture: Secondary | ICD-10-CM | POA: Diagnosis present

## 2011-03-23 DIAGNOSIS — S2242XA Multiple fractures of ribs, left side, initial encounter for closed fracture: Secondary | ICD-10-CM | POA: Diagnosis present

## 2011-03-23 DIAGNOSIS — S82143A Displaced bicondylar fracture of unspecified tibia, initial encounter for closed fracture: Secondary | ICD-10-CM | POA: Diagnosis present

## 2011-03-23 DIAGNOSIS — S82002A Unspecified fracture of left patella, initial encounter for closed fracture: Secondary | ICD-10-CM | POA: Diagnosis present

## 2011-03-23 MED ORDER — HYDROMORPHONE HCL PF 1 MG/ML IJ SOLN
0.5000 mg | INTRAMUSCULAR | Status: DC | PRN
  Administered 2011-03-23: 0.5 mg via INTRAMUSCULAR
  Filled 2011-03-23: qty 1

## 2011-03-23 MED ORDER — POLYETHYLENE GLYCOL 3350 17 G PO PACK
17.0000 g | PACK | Freq: Every day | ORAL | Status: DC
  Administered 2011-03-23 – 2011-03-25 (×3): 17 g via ORAL
  Filled 2011-03-23 (×3): qty 1

## 2011-03-23 MED ORDER — OXYCODONE HCL 5 MG PO TABS
5.0000 mg | ORAL_TABLET | ORAL | Status: DC | PRN
  Administered 2011-03-23 – 2011-03-24 (×4): 15 mg via ORAL
  Administered 2011-03-24: 10 mg via ORAL
  Administered 2011-03-24 – 2011-03-25 (×3): 15 mg via ORAL
  Administered 2011-03-25: 10 mg via ORAL
  Filled 2011-03-23 (×2): qty 3
  Filled 2011-03-23: qty 2
  Filled 2011-03-23: qty 3
  Filled 2011-03-23: qty 2
  Filled 2011-03-23 (×4): qty 3

## 2011-03-23 MED ORDER — HYDROMORPHONE HCL PF 1 MG/ML IJ SOLN: 0.5000 mg | INTRAMUSCULAR | Status: DC | PRN

## 2011-03-23 MED ORDER — DOCUSATE SODIUM 100 MG PO CAPS
100.0000 mg | ORAL_CAPSULE | Freq: Two times a day (BID) | ORAL | Status: DC
  Administered 2011-03-23: 100 mg via ORAL
  Filled 2011-03-23 (×3): qty 1

## 2011-03-23 MED ORDER — ENOXAPARIN SODIUM 30 MG/0.3ML ~~LOC~~ SOLN
30.0000 mg | Freq: Two times a day (BID) | SUBCUTANEOUS | Status: DC
  Administered 2011-03-23 – 2011-03-25 (×5): 30 mg via SUBCUTANEOUS
  Filled 2011-03-23 (×7): qty 0.3

## 2011-03-23 NOTE — Progress Notes (Signed)
Clinical Social Worker received consult for "SNF." Currently, PT/OT are recommending SNF at discharge. CSW met with pt and family to complete Brief Psychosocial Assessment and address assessment. Pt gave permission to have her family present for consult. CSW introduced herself and explained role of social work, as well as the process of SNF. Pt lives with her daughter, Misty Stanley, in [place]. Pt has family support in area. Pt shared that she has been to a skilled nursing facility before and would be open to a SNF when she is ready for discharge. CSW will continue complete FL2 and send request to [facility]. CSW will follow up with bed offers. CSW provided support to pt and her family as pt adjusts to hospital stay. CSW will continue to follow.   Dede Query, MSW, Theresia Majors [phone]

## 2011-03-23 NOTE — Progress Notes (Signed)
Working with PT. I spoke to patient's son,. ,Nicolena Schurman E

## 2011-03-23 NOTE — Progress Notes (Signed)
Physical Therapy Treatment Patient Details Name: [name] MRN: [id number] DOB: 1926/12/26 Today's Date: 03/23/2011  PT Assessment/Plan  PT - Assessment/Plan Comments on Treatment Session: Pt with good participation. Limited by pain and fatigue, able to fully participate with rest breaks. Pt fatigues quickly and requires frequent rests. PT Plan: Discharge plan remains appropriate PT Frequency: Min 5X/week Follow Up Recommendations: Skilled nursing facility Equipment Recommended: Defer to next venue PT Goals  Acute Rehab PT Goals PT Goal: Supine/Side to Sit - Progress: Progressing toward goal PT Goal: Sit to Supine/Side - Progress: Progressing toward goal PT Goal: Sit to Stand - Progress: Progressing toward goal PT Goal: Stand to Sit - Progress: Progressing toward goal PT Transfer Goal: Bed to Chair/Chair to Bed - Progress: Progressing toward goal PT Goal: Stand - Progress: Progressing toward goal  PT Treatment Precautions/Restrictions  Precautions Precautions: Fall Required Braces or Orthoses: Yes Restrictions Weight Bearing Restrictions: Yes RUE Weight Bearing: Non weight bearing LLE Weight Bearing: Non weight bearing Mobility (including Balance) Bed Mobility Bed Mobility: Yes Supine to Sit: 1: +2 Total assist Supine to Sit Details (indicate cue type and reason): pt 40% limited by pain. Requires cues for technique and sequencing to reduce pain, increase efficiency Transfers Transfers: Yes Lateral/Scoot Transfers: 1: +2 Total assist Lateral/Scoot Transfer Details (indicate cue type and reason): with sliding board bed <-> w/c. pt requires cues for placement of UEs and LEs to maintain NWB and assist in transfer.  Pt initially 50% effort, decreased to 25% with fatigue.  Scooting EOB with +2 assist with cues for positioning, placement and technique Ambulation/Gait Ambulation/Gait: No Stairs: No Wheelchair Mobility Wheelchair Mobility: No  Posture/Postural  Control Posture/Postural Control: No significant limitations Static Sitting Balance Static Sitting - Balance Support: Feet supported Static Sitting - Level of Assistance: 5: Stand by assistance End of Session PT - End of Session Equipment Utilized During Treatment: Gait belt;Sliding board Activity Tolerance: Patient limited by fatigue;Patient limited by pain Patient left: in bed;with call bell in reach;with family/visitor present General Behavior During Session: [facility] for tasks performed Cognition: [facility] for tasks performed  [name] 03/23/2011, 3:09 PM
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
## 2011-03-23 NOTE — Progress Notes (Signed)
SBIRT  CSW completed SBIRT with pt. Pt denied any use of ETOH or drugs. No further intervention needed.   Dede Query, MSW, Theresia Majors [phone]

## 2011-03-23 NOTE — Progress Notes (Signed)
UR of chart completed. Pt likely to need SNF at d/c and CSW referral already made.
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
## 2011-03-23 NOTE — Progress Notes (Signed)
Patient ID: [name], female   DOB: Jan 23, 1927, 78 y.o.   MRN: [id number] No acute changes.  Still with knee pain to be expected.  Compartments soft. Left foot NVI. Will repeat left knee x-ray today to assess plateau fx.

## 2011-03-24 LAB — CBC
HCT: 29.6 % — ABNORMAL LOW (ref 36.0–46.0)
MCV: 90 fL (ref 78.0–100.0)
RBC: 3.29 MIL/uL — ABNORMAL LOW (ref 3.87–5.11)
RDW: 13.9 % (ref 11.5–15.5)
WBC: 6.6 10*3/uL (ref 4.0–10.5)

## 2011-03-24 MED ORDER — DOCUSATE SODIUM 100 MG PO CAPS
200.0000 mg | ORAL_CAPSULE | Freq: Two times a day (BID) | ORAL | Status: DC
Start: 2011-03-24 — End: 2011-03-25
  Administered 2011-03-24 – 2011-03-25 (×3): 200 mg via ORAL
  Filled 2011-03-24: qty 2
  Filled 2011-03-24: qty 1
  Filled 2011-03-24: qty 2

## 2011-03-24 MED ORDER — BISACODYL 5 MG PO TBEC
5.0000 mg | DELAYED_RELEASE_TABLET | Freq: Every day | ORAL | Status: DC
  Administered 2011-03-24 – 2011-03-25 (×2): 5 mg via ORAL
  Filled 2011-03-24 (×2): qty 1

## 2011-03-24 NOTE — Progress Notes (Signed)
Physical Therapy Treatment Patient Details Name: [name] MRN: [id number] DOB: [birth year] Today's Date: 03/24/2011  Pt refused PT x 2 due to pain.  Will attempt this PM if time allows.  Thanks!!!!   Earlisha Sharples 03/24/2011, 12:16 PM [phone]

## 2011-03-24 NOTE — Progress Notes (Signed)
Clinical Social Worker met with pt to provide bed offers. Pt called her daughter to inform her as well. Pt and daughter are in agreement to discharge plan and would like [facility]. Pt's daughter was encouraged to contact facility to complete paperwork. CSW followed up with [facility] and confirmed that pt's daughter has an appt at 2:30pm to complete paperwork. Facility will be able to accept pt any time after paperwork is complete. CSW will continue to follow to facilitate discharge.   Dede Query, MSW, Theresia Majors [phone]

## 2011-03-24 NOTE — Progress Notes (Signed)
Patient examined and I agree with the assessment and plan  Violeta Gelinas, MD, MPH, FACS Pager: [phone]  03/24/2011 12:44 PM  Violeta Gelinas, MD, MPH, FACS Pager: [phone]
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
## 2011-03-24 NOTE — Progress Notes (Signed)
MD, please provide an order for lab to draw blood from patient's left hand.  Patient had a left mastectomy ten years ago and she currently have a right distal fracture with ace splint.  Thank you.

## 2011-03-24 NOTE — Progress Notes (Signed)
Patient ID: [name], female   DOB: 11-14-1926, 78 y.o.   MRN: [id number] No acute changes,. Left leg with soft compartments. Bledsoe brace intact,. Last left knee x-ray with no collapse of the joint,.  Rec: Continue NWB and brace left knee. I will need to see her in my office next week (Mon,. Tue,. Or Wed) to further assess her plateau fracture,.

## 2011-03-25 DIAGNOSIS — K635 Polyp of colon: Secondary | ICD-10-CM | POA: Insufficient documentation

## 2011-03-25 DIAGNOSIS — M1990 Unspecified osteoarthritis, unspecified site: Secondary | ICD-10-CM | POA: Insufficient documentation

## 2011-03-25 DIAGNOSIS — M810 Age-related osteoporosis without current pathological fracture: Secondary | ICD-10-CM | POA: Insufficient documentation

## 2011-03-25 DIAGNOSIS — E669 Obesity, unspecified: Secondary | ICD-10-CM | POA: Insufficient documentation

## 2011-03-25 DIAGNOSIS — I728 Aneurysm of other specified arteries: Secondary | ICD-10-CM | POA: Insufficient documentation

## 2011-03-25 DIAGNOSIS — E785 Hyperlipidemia, unspecified: Secondary | ICD-10-CM | POA: Insufficient documentation

## 2011-03-25 MED ORDER — ENOXAPARIN SODIUM 30 MG/0.3ML ~~LOC~~ SOLN: 30.0000 mg | Freq: Two times a day (BID) | SUBCUTANEOUS | Status: DC

## 2011-03-25 MED ORDER — OXYCODONE HCL 5 MG PO TABS: 5.0000 mg | ORAL_TABLET | ORAL | Status: AC | PRN

## 2011-03-25 MED ORDER — POLYETHYLENE GLYCOL 3350 17 G PO PACK: 17.0000 g | PACK | Freq: Every day | ORAL | Status: AC

## 2011-03-25 MED ORDER — BISACODYL 5 MG PO TBEC: 5.0000 mg | DELAYED_RELEASE_TABLET | Freq: Every day | ORAL | Status: AC

## 2011-03-25 MED ORDER — HYDROMORPHONE HCL PF 1 MG/ML IJ SOLN: 0.5000 mg | INTRAMUSCULAR | Status: DC | PRN

## 2011-03-25 MED ORDER — DSS 100 MG PO CAPS: 200.0000 mg | ORAL_CAPSULE | Freq: Two times a day (BID) | ORAL | Status: AC

## 2011-03-25 MED ORDER — METHOCARBAMOL 500 MG PO TABS: 500.0000 mg | ORAL_TABLET | Freq: Four times a day (QID) | ORAL | Status: AC | PRN

## 2011-03-25 NOTE — Progress Notes (Signed)
This patient has been seen and I agree with the findings and treatment plan.  Ife Vitelli O. Shanaiya Bene, III, MD, FACS [phone] (pager) [phone] (direct pager) Trauma Surgeon  
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
## 2011-03-25 NOTE — Progress Notes (Signed)
Physical Therapy Treatment Patient Details Name: [name] MRN: [id number] DOB: Jan 11, 1927 Today's Date: 03/25/2011  PT Assessment/Plan  PT - Assessment/Plan Comments on Treatment Session: Pt willing to get OOB after some motivation.  Limited by pain and fatigue but able to squat pivot transfer to recliner. PT Plan: Discharge plan remains appropriate PT Frequency: Min 5X/week Follow Up Recommendations: Skilled nursing facility Equipment Recommended: Defer to next venue PT Goals  Acute Rehab PT Goals PT Goal Formulation: With patient Time For Goal Achievement: 2 weeks Pt will go Supine/Side to Sit: with mod assist PT Goal: Supine/Side to Sit - Progress: Progressing toward goal Pt will go Sit to Stand: with mod assist PT Goal: Sit to Stand - Progress: Progressing toward goal Pt will go Stand to Sit: with mod assist PT Goal: Stand to Sit - Progress: Progressing toward goal Pt will Transfer Bed to Chair/Chair to Bed: with mod assist PT Transfer Goal: Bed to Chair/Chair to Bed - Progress: Progressing toward goal  PT Treatment Precautions/Restrictions  Precautions Precautions: Fall Required Braces or Orthoses: Yes Other Brace/Splint: bledsoe Restrictions Weight Bearing Restrictions: Yes RUE Weight Bearing: Non weight bearing LLE Weight Bearing: Non weight bearing Mobility (including Balance) Bed Mobility Bed Mobility: Yes Supine to Sit: 1: +2 Total assist;Patient percentage (comment) (pt 40%) Supine to Sit Details (indicate cue type and reason): Assistance with trunk OOB and cues for technique.  Assistance with LLE OOB. Sitting - Scoot to Edge of Bed: 3: Mod assist Sitting - Scoot to Edge of Bed Details (indicate cue type and reason): Assistance to scoot hips to EOB with proper weight shifts. Transfers Transfers: Yes Squat Pivot Transfers: 1: +2 Total assist;Patient percentage (comment) (pt 50%) Squat Pivot Transfer Details (indicate cue type and reason): Max cues for  technique for LE and UE placement.  Cues to prevent weight bearing thru Right UE and Left LE.  Balance Balance Assessed: Yes Static Sitting Balance Static Sitting - Balance Support: Feet supported Static Sitting - Level of Assistance: 5: Stand by assistance Static Sitting - Comment/# of Minutes: Pt sat EOB ~ 5 minutes to prepare for safe transfer. Exercise  General Exercises - Lower Extremity Ankle Circles/Pumps: AROM;Strengthening;Left;Both;10 reps;Seated End of Session PT - End of Session Equipment Utilized During Treatment: Gait belt Activity Tolerance: Patient limited by fatigue;Patient limited by pain Patient left: in chair;with call bell in reach Nurse Communication: Mobility status for transfers General Behavior During Session: [facility] for tasks performed Cognition: [facility] for tasks performed  Malary Aylesworth 03/25/2011, 12:41 PM [phone]

## 2011-03-25 NOTE — Progress Notes (Signed)
Occupational Therapy Treatment Patient Details Name: [name] MRN: [id number] DOB: [birth year] Today's Date: 03/25/2011  Time 10-1020am Pain 5/10: repositioned OT Assessment/Plan OT Assessment/Plan Comments on Treatment Session: Pt in chair when arrived; focused on right and left UE exercise HEP and positioning as discussed on eval, which pt can perform without difficultly. Also can now perform ankle pumps with left LE/ foot improiving foot drop in that LE.  OT Plan: Discharge plan remains appropriate;Frequency remains appropriate OT Frequency: Min 1X/week Follow Up Recommendations: Skilled nursing facility OT Goals Acute Rehab OT Goals OT Goal Formulation: With patient Time For Goal Achievement: 2 weeks ADL Goals Pt Will Perform Grooming: with set-up ADL Goal: Grooming - Progress: Partly met Pt Will Perform Upper Body Bathing:  (pt reports just finishing bath with NT, ? how much pt did) Miscellaneous OT Goals OT Goal: Miscellaneous Goal #1 - Progress: Met  OT Treatment Precautions/Restrictions  Precautions Precautions: Fall Restrictions Weight Bearing Restrictions: Yes RUE Weight Bearing: Non weight bearing LLE Weight Bearing: Non weight bearing   Exercises Bilateral UE positioning / generalized UE exercise with AROM in prep for assisting with transfers  General Exercises - Lower Extremity Ankle Circles/Pumps: AROM;Strengthening;Left;Both;10 reps;Seated  End of Session OT - End of Session Activity Tolerance: Patient tolerated treatment well Patient left: in chair General Behavior During Session: [facility] for tasks performed Cognition: [facility] for tasks performed  Melonie [place]  03/25/2011, 11:59 AM
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
## 2011-03-25 NOTE — Progress Notes (Signed)
Clinical Social Worker facilitated patient discharge including informing patient and contacting facility.  Patient to be transferred to [facility] via ambulance.  Patient to notify family of transfer.  No further SW needs at this time.  [address] [place], [place] [phone]

## 2011-03-25 NOTE — Discharge Summary (Signed)
Okay for SNF  This patient has been seen and I agree with the findings and treatment plan.  Marta Lamas. Gae Bon, MD, FACS [phone] (pager) [phone] (direct pager) Trauma Surgeon

## 2011-03-30 ENCOUNTER — Encounter (HOSPITAL_COMMUNITY): Payer: Self-pay

## 2011-03-30 ENCOUNTER — Encounter (HOSPITAL_COMMUNITY): Payer: Self-pay | Admitting: Pharmacy Technician

## 2011-03-30 ENCOUNTER — Other Ambulatory Visit (HOSPITAL_COMMUNITY): Payer: Self-pay | Admitting: Orthopaedic Surgery

## 2011-03-30 ENCOUNTER — Other Ambulatory Visit (HOSPITAL_COMMUNITY): Payer: No Typology Code available for payment source

## 2011-03-30 MED ORDER — CEFAZOLIN SODIUM-DEXTROSE 2-3 GM-% IV SOLR
2.0000 g | INTRAVENOUS | Status: AC
  Administered 2011-03-31: 2 g via INTRAVENOUS
  Filled 2011-03-30: qty 50

## 2011-03-31 ENCOUNTER — Encounter (HOSPITAL_COMMUNITY): Payer: Self-pay | Admitting: Critical Care Medicine

## 2011-03-31 ENCOUNTER — Encounter (HOSPITAL_COMMUNITY)
Admission: RE | Disposition: A | Discharge status: Skilled Nursing Facility | Payer: Self-pay | Source: Ambulatory Visit | Attending: Orthopaedic Surgery

## 2011-03-31 ENCOUNTER — Ambulatory Visit (HOSPITAL_COMMUNITY): Payer: Medicare Other

## 2011-03-31 ENCOUNTER — Inpatient Hospital Stay (HOSPITAL_COMMUNITY)
Admission: RE | Admit: 2011-03-31 | Discharge: 2011-04-02 | DRG: 512 | Disposition: A | Discharge status: Skilled Nursing Facility | Payer: Medicare Other | Source: Ambulatory Visit | Attending: Orthopaedic Surgery | Admitting: Orthopaedic Surgery

## 2011-03-31 ENCOUNTER — Ambulatory Visit (HOSPITAL_COMMUNITY): Payer: Medicare Other | Admitting: Critical Care Medicine

## 2011-03-31 ENCOUNTER — Other Ambulatory Visit: Payer: Self-pay

## 2011-03-31 DIAGNOSIS — M129 Arthropathy, unspecified: Secondary | ICD-10-CM | POA: Diagnosis present

## 2011-03-31 DIAGNOSIS — S52501A Unspecified fracture of the lower end of right radius, initial encounter for closed fracture: Secondary | ICD-10-CM

## 2011-03-31 DIAGNOSIS — Z882 Allergy status to sulfonamides status: Secondary | ICD-10-CM

## 2011-03-31 DIAGNOSIS — Z860100 Personal history of colon polyps, unspecified: Secondary | ICD-10-CM

## 2011-03-31 DIAGNOSIS — I10 Essential (primary) hypertension: Secondary | ICD-10-CM | POA: Diagnosis present

## 2011-03-31 DIAGNOSIS — E785 Hyperlipidemia, unspecified: Secondary | ICD-10-CM | POA: Diagnosis present

## 2011-03-31 DIAGNOSIS — S52539A Colles' fracture of unspecified radius, initial encounter for closed fracture: Principal | ICD-10-CM | POA: Diagnosis present

## 2011-03-31 DIAGNOSIS — M810 Age-related osteoporosis without current pathological fracture: Secondary | ICD-10-CM | POA: Diagnosis present

## 2011-03-31 DIAGNOSIS — Z96649 Presence of unspecified artificial hip joint: Secondary | ICD-10-CM

## 2011-03-31 DIAGNOSIS — Z888 Allergy status to other drugs, medicaments and biological substances status: Secondary | ICD-10-CM

## 2011-03-31 DIAGNOSIS — Z853 Personal history of malignant neoplasm of breast: Secondary | ICD-10-CM

## 2011-03-31 DIAGNOSIS — Z9849 Cataract extraction status, unspecified eye: Secondary | ICD-10-CM

## 2011-03-31 DIAGNOSIS — M109 Gout, unspecified: Secondary | ICD-10-CM | POA: Diagnosis present

## 2011-03-31 DIAGNOSIS — Z79899 Other long term (current) drug therapy: Secondary | ICD-10-CM

## 2011-03-31 HISTORY — DX: Psoriasis, unspecified: L40.9

## 2011-03-31 HISTORY — DX: Other specified postprocedural states: R11.2

## 2011-03-31 HISTORY — DX: Other complications of anesthesia, initial encounter: T88.59XA

## 2011-03-31 HISTORY — DX: Nausea with vomiting, unspecified: Z98.890

## 2011-03-31 HISTORY — DX: Adverse effect of unspecified anesthetic, initial encounter: T41.45XA

## 2011-03-31 HISTORY — DX: Personal history of other diseases of the digestive system: Z87.19

## 2011-03-31 LAB — CBC
MCHC: 32 g/dL (ref 30.0–36.0)
MCV: 89.6 fL (ref 78.0–100.0)
Platelets: 293 10*3/uL (ref 150–400)
RDW: 14.1 % (ref 11.5–15.5)
WBC: 5.1 10*3/uL (ref 4.0–10.5)

## 2011-03-31 LAB — BASIC METABOLIC PANEL
Chloride: 102 mEq/L (ref 96–112)
Creatinine, Ser: 1.7 mg/dL — ABNORMAL HIGH (ref 0.50–1.10)
GFR calc Af Amer: 31 mL/min — ABNORMAL LOW (ref >90)
GFR calc non Af Amer: 27 mL/min — ABNORMAL LOW (ref >90)

## 2011-03-31 LAB — SURGICAL PCR SCREEN: MRSA, PCR: NEGATIVE

## 2011-03-31 SURGERY — OPEN REDUCTION INTERNAL FIXATION (ORIF) DISTAL RADIUS FRACTURE
Anesthesia: General | Site: Arm Lower | Laterality: Right | Wound class: Clean

## 2011-03-31 MED ORDER — ONDANSETRON HCL 4 MG/2ML IJ SOLN
INTRAMUSCULAR | Status: DC | PRN
  Administered 2011-03-31: 4 mg via INTRAVENOUS

## 2011-03-31 MED ORDER — ONDANSETRON HCL 4 MG PO TABS: 4.0000 mg | ORAL_TABLET | Freq: Four times a day (QID) | ORAL | Status: DC | PRN

## 2011-03-31 MED ORDER — NEOSTIGMINE METHYLSULFATE 1 MG/ML IJ SOLN
INTRAMUSCULAR | Status: DC | PRN
  Administered 2011-03-31: 4 mg via INTRAVENOUS

## 2011-03-31 MED ORDER — MUPIROCIN 2 % EX OINT
TOPICAL_OINTMENT | CUTANEOUS | Status: AC
  Filled 2011-03-31: qty 22

## 2011-03-31 MED ORDER — RISEDRONATE SODIUM 150 MG PO TABS: 150.0000 mg | ORAL_TABLET | ORAL | Status: DC

## 2011-03-31 MED ORDER — ROSUVASTATIN CALCIUM 10 MG PO TABS
10.0000 mg | ORAL_TABLET | Freq: Every day | ORAL | Status: DC
  Administered 2011-04-01 – 2011-04-02 (×2): 10 mg via ORAL
  Filled 2011-03-31 (×2): qty 1

## 2011-03-31 MED ORDER — ZOLPIDEM TARTRATE 5 MG PO TABS: 5.0000 mg | ORAL_TABLET | Freq: Every evening | ORAL | Status: DC | PRN

## 2011-03-31 MED ORDER — BUPIVACAINE HCL (PF) 0.25 % IJ SOLN
INTRAMUSCULAR | Status: DC | PRN
  Administered 2011-03-31: 10 mL

## 2011-03-31 MED ORDER — SODIUM CHLORIDE 0.9 % IV SOLN
INTRAVENOUS | Status: DC | PRN
  Administered 2011-03-31 (×2): via INTRAVENOUS

## 2011-03-31 MED ORDER — METOCLOPRAMIDE HCL 10 MG PO TABS: 5.0000 mg | ORAL_TABLET | Freq: Three times a day (TID) | ORAL | Status: DC | PRN

## 2011-03-31 MED ORDER — OXYCODONE HCL 5 MG PO TABS
5.0000 mg | ORAL_TABLET | ORAL | Status: DC | PRN
  Administered 2011-04-01 – 2011-04-02 (×5): 10 mg via ORAL
  Filled 2011-03-31 (×5): qty 2

## 2011-03-31 MED ORDER — ROCURONIUM BROMIDE 100 MG/10ML IV SOLN
INTRAVENOUS | Status: DC | PRN
  Administered 2011-03-31: 40 mg via INTRAVENOUS
  Administered 2011-03-31: 10 mg via INTRAVENOUS

## 2011-03-31 MED ORDER — CALCIUM CITRATE-VITAMIN D 250-100 MG-UNIT PO TABS: 1.0000 | ORAL_TABLET | Freq: Two times a day (BID) | ORAL | Status: DC

## 2011-03-31 MED ORDER — ESCITALOPRAM OXALATE 10 MG PO TABS
10.0000 mg | ORAL_TABLET | Freq: Every day | ORAL | Status: DC
  Administered 2011-04-01 – 2011-04-02 (×2): 10 mg via ORAL
  Filled 2011-03-31 (×2): qty 1

## 2011-03-31 MED ORDER — GLYCOPYRROLATE 0.2 MG/ML IJ SOLN
INTRAMUSCULAR | Status: DC | PRN
  Administered 2011-03-31: .6 mg via INTRAVENOUS

## 2011-03-31 MED ORDER — METHOCARBAMOL 500 MG PO TABS: 500.0000 mg | ORAL_TABLET | Freq: Four times a day (QID) | ORAL | Status: DC | PRN

## 2011-03-31 MED ORDER — METOPROLOL SUCCINATE ER 25 MG PO TB24
25.0000 mg | ORAL_TABLET | Freq: Every day | ORAL | Status: DC
  Administered 2011-04-01 – 2011-04-02 (×2): 25 mg via ORAL
  Filled 2011-03-31 (×2): qty 1

## 2011-03-31 MED ORDER — OXYCODONE HCL 5 MG PO TABS: 5.0000 mg | ORAL_TABLET | ORAL | Status: DC | PRN

## 2011-03-31 MED ORDER — METHOCARBAMOL 100 MG/ML IJ SOLN
500.0000 mg | Freq: Four times a day (QID) | INTRAVENOUS | Status: DC | PRN
  Filled 2011-03-31: qty 5

## 2011-03-31 MED ORDER — COLCHICINE 0.6 MG PO TABS
0.6000 mg | ORAL_TABLET | Freq: Every day | ORAL | Status: DC
Start: 2011-04-01 — End: 2011-04-02
  Administered 2011-04-01 – 2011-04-02 (×2): 0.6 mg via ORAL
  Filled 2011-03-31 (×2): qty 1

## 2011-03-31 MED ORDER — FENTANYL CITRATE 0.05 MG/ML IJ SOLN
INTRAMUSCULAR | Status: DC | PRN
  Administered 2011-03-31: 25 ug via INTRAVENOUS
  Administered 2011-03-31: 100 ug via INTRAVENOUS
  Administered 2011-03-31: 25 ug via INTRAVENOUS
  Administered 2011-03-31: 50 ug via INTRAVENOUS

## 2011-03-31 MED ORDER — HYDROCODONE-ACETAMINOPHEN 5-325 MG PO TABS: 1.0000 | ORAL_TABLET | ORAL | Status: DC | PRN

## 2011-03-31 MED ORDER — ONDANSETRON HCL 4 MG/2ML IJ SOLN: 4.0000 mg | Freq: Once | INTRAMUSCULAR | Status: DC | PRN

## 2011-03-31 MED ORDER — PROPOFOL 10 MG/ML IV EMUL
INTRAVENOUS | Status: DC | PRN
  Administered 2011-03-31: 90 mg via INTRAVENOUS

## 2011-03-31 MED ORDER — CALCIUM CARBONATE-VITAMIN D 500-200 MG-UNIT PO TABS
1.0000 | ORAL_TABLET | Freq: Two times a day (BID) | ORAL | Status: DC
  Administered 2011-04-01 – 2011-04-02 (×3): 1 via ORAL
  Filled 2011-03-31 (×6): qty 1

## 2011-03-31 MED ORDER — OLMESARTAN MEDOXOMIL 40 MG PO TABS
40.0000 mg | ORAL_TABLET | Freq: Every day | ORAL | Status: DC
  Administered 2011-04-01 – 2011-04-02 (×2): 40 mg via ORAL
  Filled 2011-03-31 (×2): qty 1

## 2011-03-31 MED ORDER — METHOCARBAMOL 500 MG PO TABS
500.0000 mg | ORAL_TABLET | Freq: Four times a day (QID) | ORAL | Status: DC | PRN
  Administered 2011-04-01: 500 mg via ORAL
  Filled 2011-03-31: qty 1

## 2011-03-31 MED ORDER — MORPHINE SULFATE 2 MG/ML IJ SOLN: 1.0000 mg | INTRAMUSCULAR | Status: DC | PRN

## 2011-03-31 MED ORDER — BISACODYL 5 MG PO TBEC
5.0000 mg | DELAYED_RELEASE_TABLET | Freq: Every day | ORAL | Status: DC
  Administered 2011-04-02: 5 mg via ORAL
  Filled 2011-03-31 (×2): qty 1

## 2011-03-31 MED ORDER — ONDANSETRON HCL 4 MG/2ML IJ SOLN: 4.0000 mg | Freq: Four times a day (QID) | INTRAMUSCULAR | Status: DC | PRN

## 2011-03-31 MED ORDER — ENOXAPARIN SODIUM 40 MG/0.4ML ~~LOC~~ SOLN
40.0000 mg | Freq: Every day | SUBCUTANEOUS | Status: DC
  Administered 2011-04-01 – 2011-04-02 (×2): 40 mg via SUBCUTANEOUS
  Filled 2011-03-31 (×2): qty 0.4

## 2011-03-31 MED ORDER — DIPHENHYDRAMINE HCL 12.5 MG/5ML PO ELIX
12.5000 mg | ORAL_SOLUTION | ORAL | Status: DC | PRN
  Filled 2011-03-31: qty 10

## 2011-03-31 MED ORDER — AMLODIPINE BESYLATE 5 MG PO TABS
5.0000 mg | ORAL_TABLET | Freq: Every day | ORAL | Status: DC
  Administered 2011-04-01: 5 mg via ORAL
  Filled 2011-03-31 (×2): qty 1

## 2011-03-31 MED ORDER — METOCLOPRAMIDE HCL 5 MG/ML IJ SOLN
INTRAMUSCULAR | Status: DC | PRN
  Administered 2011-03-31: 10 mg via INTRAVENOUS

## 2011-03-31 MED ORDER — CEFAZOLIN SODIUM 1-5 GM-% IV SOLN
1.0000 g | Freq: Four times a day (QID) | INTRAVENOUS | Status: AC
  Administered 2011-04-01 (×2): 1 g via INTRAVENOUS
  Filled 2011-03-31 (×3): qty 50

## 2011-03-31 MED ORDER — METOCLOPRAMIDE HCL 5 MG/ML IJ SOLN
5.0000 mg | Freq: Three times a day (TID) | INTRAMUSCULAR | Status: DC | PRN
  Filled 2011-03-31: qty 2

## 2011-03-31 MED ORDER — ENOXAPARIN SODIUM 30 MG/0.3ML ~~LOC~~ SOLN: 30.0000 mg | Freq: Two times a day (BID) | SUBCUTANEOUS | Status: DC

## 2011-03-31 MED ORDER — DOCUSATE SODIUM 100 MG PO CAPS
200.0000 mg | ORAL_CAPSULE | Freq: Two times a day (BID) | ORAL | Status: DC
  Administered 2011-04-02: 200 mg via ORAL
  Filled 2011-03-31 (×4): qty 2

## 2011-03-31 MED ORDER — SODIUM CHLORIDE 0.9 % IV SOLN: INTRAVENOUS | Status: DC

## 2011-03-31 MED ORDER — HYDROMORPHONE HCL PF 1 MG/ML IJ SOLN
0.2500 mg | INTRAMUSCULAR | Status: DC | PRN
  Administered 2011-03-31 (×2): 0.5 mg via INTRAVENOUS

## 2011-03-31 MED ORDER — EPHEDRINE SULFATE 50 MG/ML IJ SOLN
INTRAMUSCULAR | Status: DC | PRN
  Administered 2011-03-31: 15 mg via INTRAVENOUS
  Administered 2011-03-31: 10 mg via INTRAVENOUS

## 2011-03-31 MED ORDER — DEXAMETHASONE SODIUM PHOSPHATE 4 MG/ML IJ SOLN
INTRAMUSCULAR | Status: DC | PRN
  Administered 2011-03-31: 4 mg via INTRAVENOUS

## 2011-03-31 SURGICAL SUPPLY — 61 items
BANDAGE ELASTIC 3 VELCRO ST LF (GAUZE/BANDAGES/DRESSINGS) ×1 IMPLANT
BANDAGE ELASTIC 4 VELCRO ST LF (GAUZE/BANDAGES/DRESSINGS) ×1 IMPLANT
BANDAGE GAUZE ELAST BULKY 4 IN (GAUZE/BANDAGES/DRESSINGS) ×1 IMPLANT
BIT DRILL 2 FAST STEP (BIT) ×1 IMPLANT
BIT DRILL 2.5X4 QC (BIT) ×1 IMPLANT
BNDG CMPR 9X4 STRL LF SNTH (GAUZE/BANDAGES/DRESSINGS)
BNDG ESMARK 4X9 LF (GAUZE/BANDAGES/DRESSINGS) ×1 IMPLANT
CLOTH BEACON ORANGE TIMEOUT ST (SAFETY) ×2 IMPLANT
CORDS BIPOLAR (ELECTRODE) ×2 IMPLANT
COVER SURGICAL LIGHT HANDLE (MISCELLANEOUS) ×2 IMPLANT
CUFF TOURNIQUET SINGLE 18IN (TOURNIQUET CUFF) ×2 IMPLANT
CUFF TOURNIQUET SINGLE 24IN (TOURNIQUET CUFF) IMPLANT
DRAPE OEC MINIVIEW 54X84 (DRAPES) ×2 IMPLANT
DRAPE PROXIMA HALF (DRAPES) ×1 IMPLANT
DURAPREP 26ML APPLICATOR (WOUND CARE) ×2 IMPLANT
ELECT REM PT RETURN 9FT ADLT (ELECTROSURGICAL) ×2
ELECTRODE REM PT RTRN 9FT ADLT (ELECTROSURGICAL) ×1 IMPLANT
GAUZE XEROFORM 1X8 LF (GAUZE/BANDAGES/DRESSINGS) ×2 IMPLANT
GLOVE BIOGEL PI IND STRL 8 (GLOVE) ×1 IMPLANT
GLOVE BIOGEL PI INDICATOR 8 (GLOVE) ×1
GLOVE ORTHO TXT STRL SZ7.5 (GLOVE) ×2 IMPLANT
GOWN PREVENTION PLUS LG XLONG (DISPOSABLE) IMPLANT
GOWN PREVENTION PLUS XLARGE (GOWN DISPOSABLE) ×2 IMPLANT
GOWN STRL NON-REIN LRG LVL3 (GOWN DISPOSABLE) ×2 IMPLANT
K-WIRE 1.6 (WIRE) ×2
K-WIRE FX5X1.6XNS BN SS (WIRE) ×1
KIT BASIN OR (CUSTOM PROCEDURE TRAY) ×2 IMPLANT
KIT ROOM TURNOVER OR (KITS) ×2 IMPLANT
KWIRE FX5X1.6XNS BN SS (WIRE) IMPLANT
MANIFOLD NEPTUNE II (INSTRUMENTS) ×2 IMPLANT
NEEDLE 22X1 1/2 (OR ONLY) (NEEDLE) ×1 IMPLANT
NS IRRIG 1000ML POUR BTL (IV SOLUTION) ×2 IMPLANT
PACK ORTHO EXTREMITY (CUSTOM PROCEDURE TRAY) ×2 IMPLANT
PAD ARMBOARD 7.5X6 YLW CONV (MISCELLANEOUS) ×4 IMPLANT
PAD CAST 3X4 CTTN HI CHSV (CAST SUPPLIES) IMPLANT
PADDING CAST COTTON 3X4 STRL (CAST SUPPLIES) ×2
PADDING WEBRIL 3 STERILE (GAUZE/BANDAGES/DRESSINGS) ×1 IMPLANT
PEG SUBCHONDRAL SMOOTH 2.0X18 (Peg) ×2 IMPLANT
PEG SUBCHONDRAL SMOOTH 2.0X20 (Peg) ×2 IMPLANT
PEG SUBCHONDRAL SMOOTH 2.0X22 (Peg) ×2 IMPLANT
PLATE SHORT 21.6X48.9 NRRW RT (Plate) ×1 IMPLANT
SCREW BN 12X3.5XNS CORT TI (Screw) IMPLANT
SCREW CORT 3.5X12 (Screw) ×6 IMPLANT
SPONGE GAUZE 4X4 12PLY (GAUZE/BANDAGES/DRESSINGS) ×2 IMPLANT
SPONGE LAP 4X18 X RAY DECT (DISPOSABLE) ×2 IMPLANT
STRIP CLOSURE SKIN 1/2X4 (GAUZE/BANDAGES/DRESSINGS) ×1 IMPLANT
SUCTION FRAZIER TIP 10 FR DISP (SUCTIONS) ×2 IMPLANT
SUT ETHILON 3 0 PS 1 (SUTURE) ×1 IMPLANT
SUT PROLENE 3 0 PS 1 (SUTURE) ×1 IMPLANT
SUT VIC AB 0 CT1 27 (SUTURE) ×2
SUT VIC AB 0 CT1 27XBRD ANBCTR (SUTURE) IMPLANT
SUT VIC AB 2-0 CT1 27 (SUTURE) ×2
SUT VIC AB 2-0 CT1 TAPERPNT 27 (SUTURE) IMPLANT
SUT VIC AB 3-0 X1 27 (SUTURE) ×1 IMPLANT
SUT VICRYL 4-0 PS2 18IN ABS (SUTURE) IMPLANT
SYR CONTROL 10ML LL (SYRINGE) ×1 IMPLANT
TOWEL OR 17X24 6PK STRL BLUE (TOWEL DISPOSABLE) ×2 IMPLANT
TOWEL OR 17X26 10 PK STRL BLUE (TOWEL DISPOSABLE) ×2 IMPLANT
TUBE CONNECTING 12X1/4 (SUCTIONS) ×2 IMPLANT
UNDERPAD 30X30 INCONTINENT (UNDERPADS AND DIAPERS) ×2 IMPLANT
WATER STERILE IRR 1000ML POUR (IV SOLUTION) ×1 IMPLANT

## 2011-03-31 NOTE — Anesthesia Procedure Notes (Signed)
Procedure Name: Intubation Date/Time: 03/31/2011 7:12 PM Performed by: Elon Alas Pre-anesthesia Checklist: Patient identified, Timeout performed, Emergency Drugs available, Suction available and Patient being monitored Patient Re-evaluated:Patient Re-evaluated prior to inductionOxygen Delivery Method: Circle System Utilized Preoxygenation: Pre-oxygenation with 100% oxygen Intubation Type: IV induction Ventilation: Mask ventilation without difficulty and Oral airway inserted - appropriate to patient size Laryngoscope Size: Mac and 3 Grade View: Grade I Tube type: Oral Tube size: 7.5 mm Number of attempts: 1 Airway Equipment and Method: stylet Placement Confirmation: ETT inserted through vocal cords under direct vision,  breath sounds checked- equal and bilateral and positive ETCO2 Secured at: 21 cm Tube secured with: Tape Dental Injury: Teeth and Oropharynx as per pre-operative assessment

## 2011-03-31 NOTE — Progress Notes (Signed)
PHARMACIST - PHYSICIAN COMMUNICATION  CONCERNING: P&T Medication Policy Regarding Oral Bisphosphonates  RECOMMENDATION: Your order for risedronate (Actonel) has been discontinued at this time.  If the patient's post-hospital medical condition warrants safe use of this class of drugs, please resume the pre-hospital regimen upon discharge.  DESCRIPTION:  Alendronate (Fosamax), ibandronate (Boniva), and risedronate (Actonel) can cause severe esophageal erosions in patients who are unable to remain upright at least 30 minutes after taking this medication.   Since brief interruptions in therapy are thought to have minimal impact on bone mineral density, the Pharmacy & Therapeutics Committee has established that bisphosphonate orders should be routinely discontinued during hospitalization.   To override this safety policy and permit administration of Boniva, Fosamax, or Actonel in the hospital, prescribers must write "DO NOT HOLD" in the comments section when placing the order for this class of medications.    

## 2011-03-31 NOTE — Transfer of Care (Signed)
Immediate Anesthesia Transfer of Care Note  Patient: [name]  Procedure(s) Performed:  OPEN REDUCTION INTERNAL FIXATION (ORIF) DISTAL RADIAL FRACTURE - ORIF right distal radius fracture  Patient Location: PACU  Anesthesia Type: General  Level of Consciousness: awake, alert  and oriented  Airway & Oxygen Therapy: Patient Spontanous Breathing and Patient connected to nasal cannula oxygen  Post-op Assessment: Report given to PACU RN, Post -op Vital signs reviewed and stable and Patient moving all extremities X 4  Post vital signs: Reviewed and stable Filed Vitals:   03/31/11 1426  BP: 128/59  Pulse: 54  Temp: 36.3 C  Resp: 18    Complications: No apparent anesthesia complications

## 2011-03-31 NOTE — Preoperative (Signed)
Beta Blockers   Reason not to administer Beta Blockers:Not Applicable 
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
## 2011-03-31 NOTE — Brief Op Note (Signed)
03/31/2011  8:18 PM  PATIENT:  [name]  78 y.o. female  PRE-OPERATIVE DIAGNOSIS:  right unstable distal radius fracture  POST-OPERATIVE DIAGNOSIS:  right unstable distal radius fracture  PROCEDURE:  Procedure(s): OPEN REDUCTION INTERNAL FIXATION (ORIF) DISTAL RADIAL FRACTURE  SURGEON:  Surgeon(s): Kathryne Hitch  PHYSICIAN ASSISTANT:   ASSISTANTS: none   ANESTHESIA:   local and general  EBL:  Total I/O In: 1000 [I.V.:1000] Out: -   BLOOD ADMINISTERED:none  DRAINS: none   LOCAL MEDICATIONS USED:  MARCAINE 10CC  SPECIMEN:  No Specimen  DISPOSITION OF SPECIMEN:  N/A  COUNTS:  YES  TOURNIQUET:   Total Tourniquet Time Documented: Upper Arm (Right) - 39 minutes  DICTATION: .Other Dictation: Dictation Number [id number]  PLAN OF CARE: Admit to inpatient   PATIENT DISPOSITION:  PACU - hemodynamically stable.   Delay start of Pharmacological VTE agent (>24hrs) due to surgical blood loss or risk of bleeding:  {YES/NO/NOT APPLICABLE:20182
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
## 2011-03-31 NOTE — Anesthesia Postprocedure Evaluation (Signed)
  Anesthesia Post-op Note  Patient: [name]  Procedure(s) Performed:  OPEN REDUCTION INTERNAL FIXATION (ORIF) DISTAL RADIAL FRACTURE - ORIF right distal radius fracture  Patient Location: PACU  Anesthesia Type: General  Level of Consciousness: awake and alert   Airway and Oxygen Therapy: Patient Spontanous Breathing and Patient connected to nasal cannula oxygen  Post-op Pain: mild  Post-op Assessment: Post-op Vital signs reviewed and Patient's Cardiovascular Status Stable  Post-op Vital Signs: stable  Complications: No apparent anesthesia complications

## 2011-04-01 LAB — BASIC METABOLIC PANEL
BUN: 67 mg/dL — ABNORMAL HIGH (ref 6–23)
CO2: 24 mEq/L (ref 19–32)
Chloride: 106 mEq/L (ref 96–112)
Glucose, Bld: 147 mg/dL — ABNORMAL HIGH (ref 70–99)
Potassium: 5 mEq/L (ref 3.5–5.1)

## 2011-04-01 MED ORDER — DOXYCYCLINE HYCLATE 100 MG PO TABS
100.0000 mg | ORAL_TABLET | Freq: Once | ORAL | Status: AC
  Administered 2011-04-01: 100 mg via ORAL
  Filled 2011-04-01: qty 1
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
## 2011-04-01 NOTE — Progress Notes (Signed)
Patient IV in the Left anterior forearm, placed by PACA, infiltrated while infusing normal saline at a rate of 17ml/hour. IV stopped and removed, with catheter in place. Left arm was elevated and two heat packs were placed on the arms. New Iv was started in the Right anterior forearm. Patient tolerated well.
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
## 2011-04-01 NOTE — Progress Notes (Signed)
Subjective: 1 Day Post-Op Procedure(s) (LRB): OPEN REDUCTION INTERNAL FIXATION (ORIF) DISTAL RADIAL FRACTURE (Right) Patient reports pain as moderate.    Objective: Vital signs in last 24 hours: Temp:  [97.4 F (36.3 C)-98.7 F (37.1 C)] 98.7 F (37.1 C) (01/16 0607) Pulse Rate:  [54-68] 68  (01/16 0607) Resp:  [13-18] 18  (01/16 0607) BP: (92-128)/(53-65) 110/54 mmHg (01/16 0607) SpO2:  [94 %-98 %] 95 % (01/16 0607)  Intake/Output from previous day: 01/15 0701 - 01/16 0700 In: 2278.8 [I.V.:2178.8; IV Piggyback:100] Out: 26 [Urine:1; Blood:25] Intake/Output this shift:     Basename 03/31/11 1444  HGB 9.7*    Basename 03/31/11 1444  WBC 5.1  RBC 3.38*  HCT 30.3*  PLT 293    Basename 03/31/11 1444  NA 139  K 5.4*  CL 102  CO2 26  BUN 83*  CREATININE 1.70*  GLUCOSE 90  CALCIUM 11.2*   No results found for this basename: LABPT:2,INR:2 in the last 72 hours  Sensation intact distally Intact pulses distally Incision: no drainage  Assessment/Plan: 1 Day Post-Op Procedure(s) (LRB): OPEN REDUCTION INTERNAL FIXATION (ORIF) DISTAL RADIAL FRACTURE (Right) Plan for discharge tomorrow Needs FL-2 on chart. Return likely to [facility] 04/01/2011, 7:01 AM

## 2011-04-01 NOTE — Progress Notes (Signed)
Pt was due one more dose IV ancef.  Her IV infiltrated and she is very difficult stick.  MD notified, Dr. Magnus Ivan, order received to give one time dose PO doxycycline.

## 2011-04-02 LAB — BASIC METABOLIC PANEL
BUN: 53 mg/dL — ABNORMAL HIGH (ref 6–23)
CO2: 26 mEq/L (ref 19–32)
Chloride: 104 mEq/L (ref 96–112)
Creatinine, Ser: 1.33 mg/dL — ABNORMAL HIGH (ref 0.50–1.10)
Glucose, Bld: 95 mg/dL (ref 70–99)
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
## 2011-04-02 NOTE — Progress Notes (Signed)
Utilization review completed. Briar Sword, RN, BSN. 04/02/11  

## 2011-04-02 NOTE — Plan of Care (Signed)
Problem: Discharge Progression Outcomes Goal: Barriers To Progression Addressed/Resolved Outcome: Adequate for Discharge Mobility very limited.  Pt will be slow to progress

## 2011-04-02 NOTE — Progress Notes (Signed)
Patient for d/c today to SNF bed at [facility] where she was rehabilitating prior to admission. SNF to provide transportation. Reece Levy, MSW, Theresia Majors [phone]

## 2011-04-25 ENCOUNTER — Telehealth: Payer: Self-pay | Admitting: Oncology

## 2011-04-25 NOTE — Telephone Encounter (Signed)
S/w the pt's dtr regarding the sept 2013 appts

## 2011-11-03 ENCOUNTER — Telehealth: Payer: Self-pay | Admitting: Oncology

## 2011-11-03 ENCOUNTER — Telehealth: Payer: Self-pay | Admitting: Medical Oncology

## 2011-11-03 NOTE — Telephone Encounter (Signed)
l/m that 9;6 appt had been r/s to 10/4   aom

## 2011-11-03 NOTE — Telephone Encounter (Signed)
Pt called and left a message stating that someone called her but she could not get the message. I informed her that her appointment is being rescheduled from 11/20/11 to 12/18/11 to 2030 pm lab and 300pm MD. She voiced understanding.

## 2011-11-20 ENCOUNTER — Other Ambulatory Visit: Payer: No Typology Code available for payment source | Admitting: Lab

## 2011-11-20 ENCOUNTER — Ambulatory Visit: Payer: No Typology Code available for payment source | Admitting: Oncology

## 2011-12-15 ENCOUNTER — Telehealth: Payer: Self-pay | Admitting: Oncology

## 2011-12-15 NOTE — Telephone Encounter (Signed)
pt called to r/s 10/4 appt to november,done     aom

## 2011-12-18 ENCOUNTER — Ambulatory Visit: Payer: Self-pay | Admitting: Oncology

## 2011-12-18 ENCOUNTER — Other Ambulatory Visit: Payer: Self-pay | Admitting: Lab

## 2012-01-11 ENCOUNTER — Encounter: Payer: Self-pay | Admitting: Oncology

## 2012-01-15 ENCOUNTER — Encounter: Payer: Self-pay | Admitting: Family

## 2012-01-15 ENCOUNTER — Ambulatory Visit (HOSPITAL_BASED_OUTPATIENT_CLINIC_OR_DEPARTMENT_OTHER): Payer: Medicare Other | Admitting: Family

## 2012-01-15 ENCOUNTER — Other Ambulatory Visit (HOSPITAL_BASED_OUTPATIENT_CLINIC_OR_DEPARTMENT_OTHER): Payer: Medicare Other | Admitting: Lab

## 2012-01-15 VITALS — BP 116/68 | HR 73 | Temp 97.1°F | Resp 20 | Ht 65.0 in | Wt 192.6 lb

## 2012-01-15 DIAGNOSIS — Z853 Personal history of malignant neoplasm of breast: Secondary | ICD-10-CM

## 2012-01-15 LAB — CBC WITH DIFFERENTIAL/PLATELET
BASO%: 1.4 % (ref 0.0–2.0)
HCT: 35.6 % (ref 34.8–46.6)
LYMPH%: 25.1 % (ref 14.0–49.7)
MCHC: 34.3 g/dL (ref 31.5–36.0)
MCV: 89.2 fL (ref 79.5–101.0)
MONO#: 0.3 10*3/uL (ref 0.1–0.9)
NEUT%: 62.5 % (ref 38.4–76.8)
Platelets: 217 10*3/uL (ref 145–400)
WBC: 4.3 10*3/uL (ref 3.9–10.3)

## 2012-01-15 LAB — COMPREHENSIVE METABOLIC PANEL (CC13)
AST: 113 U/L — ABNORMAL HIGH (ref 5–34)
BUN: 21 mg/dL (ref 7.0–26.0)
CO2: 27 mEq/L (ref 22–29)
Calcium: 11 mg/dL — ABNORMAL HIGH (ref 8.4–10.4)
Chloride: 106 mEq/L (ref 98–107)
Creatinine: 1 mg/dL (ref 0.6–1.1)

## 2012-01-15 NOTE — Patient Instructions (Addendum)
Please contact us if you have any questions or concerns.
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
## 2012-01-18 ENCOUNTER — Telehealth: Payer: Self-pay | Admitting: Family

## 2012-01-18 ENCOUNTER — Telehealth: Payer: Self-pay | Admitting: *Deleted

## 2012-01-18 NOTE — Telephone Encounter (Signed)
Per orders from 01-15-2012 patient will follow up when needed

## 2012-01-18 NOTE — Telephone Encounter (Signed)
Verified with Nicholos Johns at Dr. Joyce Copa office [phone]) that lab results (elevated AST and ALT) for Ms. Nickleston have been received by them.

## 2015-03-20 ENCOUNTER — Emergency Department (HOSPITAL_COMMUNITY): Payer: Medicare Other

## 2015-03-20 ENCOUNTER — Encounter (HOSPITAL_COMMUNITY): Payer: Self-pay | Admitting: *Deleted

## 2015-03-20 ENCOUNTER — Emergency Department (HOSPITAL_COMMUNITY)
Admission: EM | Admit: 2015-03-20 | Discharge: 2015-03-21 | Disposition: A | Discharge status: Home / Self Care | Payer: Medicare Other | Attending: Emergency Medicine | Admitting: Emergency Medicine

## 2015-03-20 DIAGNOSIS — E785 Hyperlipidemia, unspecified: Secondary | ICD-10-CM | POA: Diagnosis not present

## 2015-03-20 DIAGNOSIS — Z872 Personal history of diseases of the skin and subcutaneous tissue: Secondary | ICD-10-CM | POA: Insufficient documentation

## 2015-03-20 DIAGNOSIS — Z87891 Personal history of nicotine dependence: Secondary | ICD-10-CM | POA: Insufficient documentation

## 2015-03-20 DIAGNOSIS — Z79899 Other long term (current) drug therapy: Secondary | ICD-10-CM | POA: Insufficient documentation

## 2015-03-20 DIAGNOSIS — I10 Essential (primary) hypertension: Secondary | ICD-10-CM | POA: Insufficient documentation

## 2015-03-20 DIAGNOSIS — M810 Age-related osteoporosis without current pathological fracture: Secondary | ICD-10-CM | POA: Insufficient documentation

## 2015-03-20 DIAGNOSIS — K432 Incisional hernia without obstruction or gangrene: Secondary | ICD-10-CM | POA: Insufficient documentation

## 2015-03-20 DIAGNOSIS — R1030 Lower abdominal pain, unspecified: Secondary | ICD-10-CM

## 2015-03-20 DIAGNOSIS — Z86010 Personal history of colonic polyps: Secondary | ICD-10-CM | POA: Insufficient documentation

## 2015-03-20 DIAGNOSIS — Z853 Personal history of malignant neoplasm of breast: Secondary | ICD-10-CM | POA: Insufficient documentation

## 2015-03-20 DIAGNOSIS — M109 Gout, unspecified: Secondary | ICD-10-CM | POA: Insufficient documentation

## 2015-03-20 DIAGNOSIS — M1990 Unspecified osteoarthritis, unspecified site: Secondary | ICD-10-CM | POA: Insufficient documentation

## 2015-03-20 DIAGNOSIS — K458 Other specified abdominal hernia without obstruction or gangrene: Secondary | ICD-10-CM

## 2015-03-20 DIAGNOSIS — R1032 Left lower quadrant pain: Secondary | ICD-10-CM | POA: Diagnosis present

## 2015-03-20 LAB — CBC
HEMATOCRIT: 38.6 % (ref 36.0–46.0)
HEMOGLOBIN: 12.7 g/dL (ref 12.0–15.0)
MCH: 29.8 pg (ref 26.0–34.0)
MCHC: 32.9 g/dL (ref 30.0–36.0)
MCV: 90.6 fL (ref 78.0–100.0)
Platelets: 165 10*3/uL (ref 150–400)
RBC: 4.26 MIL/uL (ref 3.87–5.11)
RDW: 13.5 % (ref 11.5–15.5)
WBC: 5.7 10*3/uL (ref 4.0–10.5)

## 2015-03-20 LAB — COMPREHENSIVE METABOLIC PANEL
ALT: 23 U/L (ref 14–54)
AST: 39 U/L (ref 15–41)
Albumin: 4.1 g/dL (ref 3.5–5.0)
Alkaline Phosphatase: 58 U/L (ref 38–126)
Anion gap: 12 (ref 5–15)
BUN: 17 mg/dL (ref 6–20)
CHLORIDE: 103 mmol/L (ref 101–111)
CO2: 24 mmol/L (ref 22–32)
Calcium: 10.2 mg/dL (ref 8.9–10.3)
Creatinine, Ser: 0.9 mg/dL (ref 0.44–1.00)
GFR, EST NON AFRICAN AMERICAN: 56 mL/min — AB (ref >60)
Glucose, Bld: 97 mg/dL (ref 65–99)
POTASSIUM: 4.1 mmol/L (ref 3.5–5.1)
SODIUM: 139 mmol/L (ref 135–145)
Total Bilirubin: 0.5 mg/dL (ref 0.3–1.2)
Total Protein: 7.8 g/dL (ref 6.5–8.1)

## 2015-03-20 LAB — LIPASE, BLOOD: LIPASE: 21 U/L (ref 11–51)

## 2015-03-20 MED ORDER — IOHEXOL 300 MG/ML  SOLN
100.0000 mL | Freq: Once | INTRAMUSCULAR | Status: AC | PRN
  Administered 2015-03-20: 100 mL via INTRAVENOUS

## 2015-03-20 NOTE — ED Notes (Signed)
Patient transported to CT 

## 2015-03-20 NOTE — ED Provider Notes (Signed)
CSN: [id number]     Arrival date & time 03/20/15  1626 History   First MD Initiated Contact with Patient 03/20/15 2202     Chief Complaint  Patient presents with  . Abdominal Pain     (Consider location/radiation/quality/duration/timing/severity/associated sxs/prior Treatment) Patient is a 82 y.o. female presenting with abdominal pain. The history is provided by the patient.  Abdominal Pain Pain location:  LLQ and RLQ Pain quality: aching   Pain radiates to:  Does not radiate Pain severity:  Mild Onset quality:  Gradual Timing:  Constant Progression:  Resolved Chronicity:  Recurrent Context: previous surgery   Context: not diet changes, not eating, not medication withdrawal, not recent illness, not recent sexual activity, not recent travel, not retching, not sick contacts and not suspicious food intake   Relieved by: Palpation of hernia. Worsened by:  Palpation and position changes Ineffective treatments:  None tried Associated symptoms: no anorexia, no belching, no chest pain, no chills, no constipation, no cough, no diarrhea, no dysuria, no fatigue, no fever, no flatus, no hematemesis, no hematochezia, no hematuria, no melena, no nausea, no shortness of breath, no sore throat, no vaginal bleeding, no vaginal discharge and no vomiting   Risk factors: being elderly, multiple surgeries and obesity     Past Medical History  Diagnosis Date  . Osteoporosis   . Arthritis   . Colon polyps     Benign  . Abdominal aortic aneurysm ([place])   . Hyperlipidemia   . Splenic artery aneurysm ([place])   . Complication of anesthesia   . PONV (postoperative nausea and vomiting)   . Hypertension     sees Dr. Edrick Oh, saw last approx. 1 month ago  . Psoriasis   . H/O hiatal hernia   . Gout   . Cancer [facility])     Left Breast , Dr. Cherylann Banas   Past Surgical History  Procedure Laterality Date  . Mastectomy  08/22/2001    Left  . Abdominal aortic aneurysm repair  09/09/2009    Dacron Graft repair  by  Dr. Ruta Hinds  . Hernia repair    . Joint replacement  2008    Right Total Hip  . Eye surgery      left cataract removal  . Orif distal radius fracture     Family History  Problem Relation Age of Onset  . Cancer Sister     ovarian cancer  . Cancer Brother     Lung Cancer   Social History  Substance Use Topics  . Smoking status: Former Smoker -- 15 years    Types: Cigarettes    Quit date: 03/16/2004  . Smokeless tobacco: Never Used  . Alcohol Use: No   OB History    No data available     Review of Systems  Constitutional: Negative for fever, chills and fatigue.  HENT: Negative for sore throat.   Eyes: Negative for pain.  Respiratory: Negative for cough and shortness of breath.   Cardiovascular: Negative for chest pain.  Gastrointestinal: Positive for abdominal pain. Negative for nausea, vomiting, diarrhea, constipation, melena, hematochezia, anorexia, flatus and hematemesis.  Genitourinary: Negative for dysuria, hematuria, vaginal bleeding and vaginal discharge.  Musculoskeletal: Negative for back pain.  Neurological: Negative for dizziness and light-headedness.  Psychiatric/Behavioral: Negative for confusion.      Allergies  Prednisone and Sulfa antibiotics  Home Medications   Prior to Admission medications   Medication Sig Start Date End Date Taking? Authorizing Provider  amLODipine (NORVASC) 5 MG tablet Take  5 mg by mouth daily.     Yes Historical Provider, MD  calcium-vitamin D 250-100 MG-UNIT per tablet Take 1 tablet by mouth 2 (two) times daily.     Yes Historical Provider, MD  colchicine 0.6 MG tablet Take 0.6 mg by mouth daily.     Yes Historical Provider, MD  docusate sodium (COLACE) 100 MG capsule Take 100 mg by mouth 2 (two) times daily.   Yes Historical Provider, MD  escitalopram (LEXAPRO) 10 MG tablet Take 10 mg by mouth daily.     Yes Historical Provider, MD  fenofibrate micronized (ANTARA) 130 MG capsule Take 130 mg by mouth daily before  breakfast.     Yes Historical Provider, MD  HYDROcodone-acetaminophen (NORCO) 10-325 MG per tablet Take 1 tablet by mouth as needed for moderate pain.    Yes Historical Provider, MD  metoprolol succinate (TOPROL-XL) 25 MG 24 hr tablet Take 25 mg by mouth daily.     Yes Historical Provider, MD  rosuvastatin (CRESTOR) 10 MG tablet Take 10 mg by mouth daily.     Yes Historical Provider, MD  telmisartan (MICARDIS) 80 MG tablet Take 80 mg by mouth daily.     Yes Historical Provider, MD   BP 163/84 mmHg  Pulse 106  Temp(Src) 98.4 F (36.9 C) (Oral)  Resp 18  SpO2 91% Physical Exam  Constitutional: She is oriented to person, place, and time. She appears well-developed and well-nourished. No distress.  HENT:  Head: Normocephalic and atraumatic.  Eyes: Conjunctivae and EOM are normal. Pupils are equal, round, and reactive to light.  Neck: Normal range of motion.  Cardiovascular: Normal rate, regular rhythm and normal heart sounds.  Exam reveals no gallop.   No murmur heard. Pulmonary/Chest: Effort normal and breath sounds normal. No respiratory distress. She has no wheezes. She has no rales.  Abdominal: Soft. Normal appearance and bowel sounds are normal. She exhibits no distension and no mass. There is no tenderness. There is no rebound and no guarding. A hernia is present. Hernia confirmed positive in the ventral area.    Neurological: She is alert and oriented to person, place, and time. No cranial nerve deficit. Coordination normal.  Skin: Skin is warm and dry. She is not diaphoretic.  Psychiatric: She has a normal mood and affect. Her behavior is normal.    ED Course  Procedures (including critical care time) Labs Review Labs Reviewed  COMPREHENSIVE METABOLIC PANEL - Abnormal; Notable for the following:    GFR calc non Af Amer 56 (*)    All other components within normal limits  LIPASE, BLOOD  CBC  URINALYSIS, ROUTINE W REFLEX MICROSCOPIC (NOT AT [facility])    Imaging Review Ct  Abdomen Pelvis W Contrast  03/21/2015  CLINICAL DATA:  Acute onset of mid lower abdominal pain. Initial encounter. EXAM: CT ABDOMEN AND PELVIS WITH CONTRAST TECHNIQUE: Multidetector CT imaging of the abdomen and pelvis was performed using the standard protocol following bolus administration of intravenous contrast. CONTRAST:  158mL OMNIPAQUE IOHEXOL 300 MG/ML  SOLN COMPARISON:  CT the chest, abdomen and pelvis performed 03/20/2011 FINDINGS: The visualized lung bases are clear. The patient is status post left-sided mastectomy. The liver and spleen are unremarkable in appearance. Stones are noted dependently within the gallbladder. The gallbladder is otherwise unremarkable. The pancreas and adrenal glands are unremarkable. An apparent thrombosed 1.6 cm splenic artery aneurysm is noted adjacent to the distal body of the pancreas. A nonobstructing 4 mm stone is noted at the lower pole of  the left kidney. Bilateral renal cysts are noted, measuring up to 1.5 cm in size. Minimal nonspecific perinephric stranding is noted bilaterally. The kidneys are otherwise unremarkable. No obstructing ureteral stones are seen. There is no evidence of hydronephrosis. No free fluid is identified. The small bowel is unremarkable in appearance. The stomach is within normal limits. No acute vascular abnormalities are seen. Scattered calcification is noted along the abdominal aorta and its branches, particularly along the native common iliac arteries. Postoperative change is noted along the distal abdominal aorta and common iliac arteries. A moderate anterior abdominal wall hernia is noted superior to the umbilicus, containing a short segment of transverse colon. A larger anterior abdominal wall hernia is noted about the level of the umbilicus, containing multiple loops of small bowel, without evidence for obstruction. The appendix is not definitely characterized; there is no evidence for appendicitis. The colon is relatively decompressed and  grossly unremarkable in appearance. The bladder is moderately distended and grossly unremarkable. A few small calcified fibroids within the uterus. The uterus is otherwise unremarkable. The ovaries are grossly symmetric. No suspicious adnexal masses are seen. No inguinal lymphadenopathy is seen. No acute osseous abnormalities are identified. A right hip hemiarthroplasty is grossly unremarkable in appearance, though incompletely imaged. The patient is status post vertebroplasty at vertebral body T9. Compression deformities are also noted at T8, T12 and L1. IMPRESSION: 1. No acute abnormality seen to explain the patient's symptoms. 2. Moderate anterior abdominal wall hernia superior to the umbilicus, containing a short segment of transverse colon. Larger anterior abdominal wall hernia about the level of the umbilicus, containing multiple loops of small bowel. No evidence for obstruction. 3. Postoperative change along the distal abdominal aorta and common iliac arteries. No evidence of recurrent aneurysm. 4. Nonobstructing 4 mm stone at the lower pole of the left kidney. Bilateral renal cysts noted. 5. Cholelithiasis.  Gallbladder otherwise unremarkable. 6. Apparent thrombosed 1.6 cm splenic artery aneurysm noted adjacent to the distal body of the pancreas, similar in appearance to 2013. 7. Few small calcified fibroids within the uterus. 8. Worsening chronic compression deformity involving vertebral body T11. Remaining compression deformities are relatively stable in appearance. Electronically Signed   By: Garald Balding M.D.   On: 03/21/2015 00:02   I have personally reviewed and evaluated these images and lab results as part of my medical decision-making.   EKG Interpretation None      MDM   Final diagnoses:  Lower abdominal pain  Recurrent abdominal hernia without obstruction or gangrene, unspecified hernia type    82 year old Caucasian female with past medical history of AAA status post repair,  multiple abdominal wall hernias presents in setting of lower abdomen pain. Patient reports pain began this morning and was aching in nature and her lower abdomen bilaterally. She reports is worse at sites of abdominal wall hernias. She was seen up PCP for this and PCP was able to reduce hernia. She reports that time her pain has resolved. She reports prior to that that the hernia felt tight. She denies any nausea, vomiting, chest pain, shortness of breath, dysuria, hematuria, Cox patient, diarrhea, fevers, rashes. However due to patient's history she was advised to come to the emergency department for further evaluation.  Laboratory analysis not reveal signs of pancreatitis, electrolyte abnormality, acute kidney injury, elevation in white blood cell count, anemia. Due to patient's history of AAA and hernia CT abdomen pelvis performed which did not reveal any new aneurysm. Additionally did not reveal any acute nephrolithiasis or  incarcerated hernia. Patient continued to be pain-free. At this time believe pain was likely related to hernia. Patient has surgeon who she can follow up with. Additionally advised patient to follow up PCP for further management of symptoms. Patient advised of signs and symptoms of incarcerated hernia that would necessitate immediate return to emergency department. Patient given strict return precautions and stable at time of discharge. Patient and family in agreement with plan.  Attending has seen and evaluated patient and Dr. Rex Kras is in agreement with plan.    Esaw Grandchild, MD 03/21/15 [id number]  Sharlett Iles, MD 03/21/15 [phone]

## 2015-03-20 NOTE — ED Notes (Signed)
Pt c/o abd pain since this morning. denies N/V/D.

## 2015-03-21 NOTE — Discharge Instructions (Signed)
Abdominal Pain, Adult Many things can cause abdominal pain. Usually, abdominal pain is not caused by a disease and will improve without treatment. It can often be observed and treated at home. Your health care provider will do a physical exam and possibly order blood tests and X-rays to help determine the seriousness of your pain. However, in many cases, more time must pass before a clear cause of the pain can be found. Before that point, your health care provider may not know if you need more testing or further treatment. HOME CARE INSTRUCTIONS Monitor your abdominal pain for any changes. The following actions may help to alleviate any discomfort you are experiencing:  Only take over-the-counter or prescription medicines as directed by your health care provider.  Do not take laxatives unless directed to do so by your health care provider.  Try a clear liquid diet (broth, tea, or water) as directed by your health care provider. Slowly move to a bland diet as tolerated. SEEK MEDICAL CARE IF:  You have unexplained abdominal pain.  You have abdominal pain associated with nausea or diarrhea.  You have pain when you urinate or have a bowel movement.  You experience abdominal pain that wakes you in the night.  You have abdominal pain that is worsened or improved by eating food.  You have abdominal pain that is worsened with eating fatty foods.  You have a fever. SEEK IMMEDIATE MEDICAL CARE IF:  Your pain does not go away within 2 hours.  You keep throwing up (vomiting).  Your pain is felt only in portions of the abdomen, such as the right side or the left lower portion of the abdomen.  You pass bloody or black tarry stools. MAKE SURE YOU:  Understand these instructions.  Will watch your condition.  Will get help right away if you are not doing well or get worse.   This information is not intended to replace advice given to you by your health care provider. Make sure you discuss  any questions you have with your health care provider.   Document Released: 12/10/2004 Document Revised: 11/21/2014 Document Reviewed: 11/09/2012 Elsevier Interactive Patient Education 2016 [place], Adult A hernia is the bulging of an organ or tissue through a weak spot in the muscles of the abdomen (abdominal wall). Hernias develop most often near the navel or groin. There are many kinds of hernias. Common kinds include:  Femoral hernia. This kind of hernia develops under the groin in the upper thigh area.  Inguinal hernia. This kind of hernia develops in the groin or scrotum.  Umbilical hernia. This kind of hernia develops near the navel.  Hiatal hernia. This kind of hernia causes part of the stomach to be pushed up into the chest.  Incisional hernia. This kind of hernia bulges through a scar from an abdominal surgery. CAUSES This condition may be caused by:  Heavy lifting.  Coughing over a long period of time.  Straining to have a bowel movement.  An incision made during an abdominal surgery.  A birth defect (congenital defect).  Excess weight or obesity.  Smoking.  Poor nutrition.  Cystic fibrosis.  Excess fluid in the abdomen.  Undescended testicles. SYMPTOMS Symptoms of a hernia include:  A lump on the abdomen. This is the first sign of a hernia. The lump may become more obvious with standing, straining, or coughing. It may get bigger over time if it is not treated or if the condition causing it is not treated.  Pain. A hernia is usually painless, but it may become painful over time if treatment is delayed. The pain is usually dull and may get worse with standing or lifting heavy objects. Sometimes a hernia gets tightly squeezed in the weak spot (strangulated) or stuck there (incarcerated) and causes additional symptoms. These symptoms may include:  Vomiting.  Nausea.  Constipation.  Irritability. DIAGNOSIS A hernia may be diagnosed  with:  A physical exam. During the exam your health care provider may ask you to cough or to make a specific movement, because a hernia is usually more visible when you move.  Imaging tests. These can include:  X-rays.  Ultrasound.  CT scan. TREATMENT A hernia that is small and painless may not need to be treated. A hernia that is large or painful may be treated with surgery. Inguinal hernias may be treated with surgery to prevent incarceration or strangulation. Strangulated hernias are always treated with surgery, because lack of blood to the trapped organ or tissue can cause it to die. Surgery to treat a hernia involves pushing the bulge back into place and repairing the weak part of the abdomen. HOME CARE INSTRUCTIONS  Avoid straining.  Do not lift anything heavier than 10 lb (4.5 kg).  Lift with your leg muscles, not your back muscles. This helps avoid strain.  When coughing, try to cough gently.  Prevent constipation. Constipation leads to straining with bowel movements, which can make a hernia worse or cause a hernia repair to break down. You can prevent constipation by:  Eating a high-fiber diet that includes plenty of fruits and vegetables.  Drinking enough fluids to keep your urine clear or pale yellow. Aim to drink 6-8 glasses of water per day.  Using a stool softener as directed by your health care provider.  Lose weight, if you are overweight.  Do not use any tobacco products, including cigarettes, chewing tobacco, or electronic cigarettes. If you need help quitting, ask your health care provider.  Keep all follow-up visits as directed by your health care provider. This is important. Your health care provider may need to monitor your condition. SEEK MEDICAL CARE IF:  You have swelling, redness, and pain in the affected area.  Your bowel habits change. SEEK IMMEDIATE MEDICAL CARE IF:  You have a fever.  You have abdominal pain that is getting worse.  You  feel nauseous or you vomit.  You cannot push the hernia back in place by gently pressing on it while you are lying down.  The hernia:  Changes in shape or size.  Is stuck outside the abdomen.  Becomes discolored.  Feels hard or tender.   This information is not intended to replace advice given to you by your health care provider. Make sure you discuss any questions you have with your health care provider.   Document Released: 03/02/2005 Document Revised: 03/23/2014 Document Reviewed: 01/10/2014 Elsevier Interactive Patient Education [employer].

## 2015-03-21 NOTE — ED Notes (Signed)
MD at bedside. 

## 2016-03-02 ENCOUNTER — Emergency Department (HOSPITAL_COMMUNITY): Payer: Medicare Other

## 2016-03-02 ENCOUNTER — Encounter (HOSPITAL_COMMUNITY): Payer: Self-pay

## 2016-03-02 ENCOUNTER — Inpatient Hospital Stay (HOSPITAL_COMMUNITY)
Admission: EM | Admit: 2016-03-02 | Discharge: 2016-03-06 | DRG: 481 | Disposition: A | Discharge status: Rehabilitation Facility | Payer: Medicare Other | Attending: Internal Medicine | Admitting: Internal Medicine

## 2016-03-02 DIAGNOSIS — S72002D Fracture of unspecified part of neck of left femur, subsequent encounter for closed fracture with routine healing: Secondary | ICD-10-CM | POA: Diagnosis present

## 2016-03-02 DIAGNOSIS — S72002A Fracture of unspecified part of neck of left femur, initial encounter for closed fracture: Secondary | ICD-10-CM | POA: Diagnosis present

## 2016-03-02 DIAGNOSIS — E785 Hyperlipidemia, unspecified: Secondary | ICD-10-CM | POA: Diagnosis present

## 2016-03-02 DIAGNOSIS — I130 Hypertensive heart and chronic kidney disease with heart failure and stage 1 through stage 4 chronic kidney disease, or unspecified chronic kidney disease: Secondary | ICD-10-CM | POA: Diagnosis present

## 2016-03-02 DIAGNOSIS — Z96641 Presence of right artificial hip joint: Secondary | ICD-10-CM | POA: Diagnosis present

## 2016-03-02 DIAGNOSIS — I10 Essential (primary) hypertension: Secondary | ICD-10-CM | POA: Diagnosis present

## 2016-03-02 DIAGNOSIS — Z8041 Family history of malignant neoplasm of ovary: Secondary | ICD-10-CM | POA: Diagnosis not present

## 2016-03-02 DIAGNOSIS — D62 Acute posthemorrhagic anemia: Secondary | ICD-10-CM | POA: Diagnosis not present

## 2016-03-02 DIAGNOSIS — Z8679 Personal history of other diseases of the circulatory system: Secondary | ICD-10-CM | POA: Diagnosis not present

## 2016-03-02 DIAGNOSIS — Z419 Encounter for procedure for purposes other than remedying health state, unspecified: Secondary | ICD-10-CM

## 2016-03-02 DIAGNOSIS — I728 Aneurysm of other specified arteries: Secondary | ICD-10-CM | POA: Diagnosis present

## 2016-03-02 DIAGNOSIS — N183 Chronic kidney disease, stage 3 unspecified: Secondary | ICD-10-CM

## 2016-03-02 DIAGNOSIS — I129 Hypertensive chronic kidney disease with stage 1 through stage 4 chronic kidney disease, or unspecified chronic kidney disease: Secondary | ICD-10-CM | POA: Diagnosis present

## 2016-03-02 DIAGNOSIS — E861 Hypovolemia: Secondary | ICD-10-CM | POA: Diagnosis present

## 2016-03-02 DIAGNOSIS — G8918 Other acute postprocedural pain: Secondary | ICD-10-CM

## 2016-03-02 DIAGNOSIS — Z881 Allergy status to other antibiotic agents status: Secondary | ICD-10-CM | POA: Diagnosis not present

## 2016-03-02 DIAGNOSIS — D649 Anemia, unspecified: Secondary | ICD-10-CM | POA: Diagnosis not present

## 2016-03-02 DIAGNOSIS — S72002S Fracture of unspecified part of neck of left femur, sequela: Secondary | ICD-10-CM | POA: Diagnosis not present

## 2016-03-02 DIAGNOSIS — Y92000 Kitchen of unspecified non-institutional (private) residence as  the place of occurrence of the external cause: Secondary | ICD-10-CM | POA: Diagnosis not present

## 2016-03-02 DIAGNOSIS — M810 Age-related osteoporosis without current pathological fracture: Secondary | ICD-10-CM | POA: Diagnosis present

## 2016-03-02 DIAGNOSIS — R0989 Other specified symptoms and signs involving the circulatory and respiratory systems: Secondary | ICD-10-CM | POA: Diagnosis not present

## 2016-03-02 DIAGNOSIS — Z801 Family history of malignant neoplasm of trachea, bronchus and lung: Secondary | ICD-10-CM | POA: Diagnosis not present

## 2016-03-02 DIAGNOSIS — S72142D Displaced intertrochanteric fracture of left femur, subsequent encounter for closed fracture with routine healing: Secondary | ICD-10-CM | POA: Diagnosis not present

## 2016-03-02 DIAGNOSIS — S72142A Displaced intertrochanteric fracture of left femur, initial encounter for closed fracture: Principal | ICD-10-CM | POA: Diagnosis present

## 2016-03-02 DIAGNOSIS — G8929 Other chronic pain: Secondary | ICD-10-CM | POA: Diagnosis present

## 2016-03-02 DIAGNOSIS — K439 Ventral hernia without obstruction or gangrene: Secondary | ICD-10-CM | POA: Diagnosis present

## 2016-03-02 DIAGNOSIS — K449 Diaphragmatic hernia without obstruction or gangrene: Secondary | ICD-10-CM | POA: Diagnosis present

## 2016-03-02 DIAGNOSIS — D696 Thrombocytopenia, unspecified: Secondary | ICD-10-CM

## 2016-03-02 DIAGNOSIS — W010XXA Fall on same level from slipping, tripping and stumbling without subsequent striking against object, initial encounter: Secondary | ICD-10-CM | POA: Diagnosis present

## 2016-03-02 DIAGNOSIS — M25552 Pain in left hip: Secondary | ICD-10-CM | POA: Diagnosis present

## 2016-03-02 DIAGNOSIS — R410 Disorientation, unspecified: Secondary | ICD-10-CM | POA: Diagnosis present

## 2016-03-02 DIAGNOSIS — K5900 Constipation, unspecified: Secondary | ICD-10-CM | POA: Diagnosis present

## 2016-03-02 DIAGNOSIS — I739 Peripheral vascular disease, unspecified: Secondary | ICD-10-CM | POA: Diagnosis present

## 2016-03-02 DIAGNOSIS — R600 Localized edema: Secondary | ICD-10-CM | POA: Diagnosis not present

## 2016-03-02 DIAGNOSIS — E441 Mild protein-calorie malnutrition: Secondary | ICD-10-CM | POA: Diagnosis present

## 2016-03-02 DIAGNOSIS — W19XXXA Unspecified fall, initial encounter: Secondary | ICD-10-CM

## 2016-03-02 DIAGNOSIS — R739 Hyperglycemia, unspecified: Secondary | ICD-10-CM | POA: Diagnosis present

## 2016-03-02 DIAGNOSIS — S72009S Fracture of unspecified part of neck of unspecified femur, sequela: Secondary | ICD-10-CM | POA: Diagnosis not present

## 2016-03-02 DIAGNOSIS — M25512 Pain in left shoulder: Secondary | ICD-10-CM | POA: Diagnosis present

## 2016-03-02 DIAGNOSIS — M549 Dorsalgia, unspecified: Secondary | ICD-10-CM | POA: Diagnosis present

## 2016-03-02 DIAGNOSIS — R443 Hallucinations, unspecified: Secondary | ICD-10-CM | POA: Diagnosis not present

## 2016-03-02 DIAGNOSIS — M109 Gout, unspecified: Secondary | ICD-10-CM | POA: Diagnosis present

## 2016-03-02 DIAGNOSIS — Z853 Personal history of malignant neoplasm of breast: Secondary | ICD-10-CM | POA: Diagnosis not present

## 2016-03-02 DIAGNOSIS — D72819 Decreased white blood cell count, unspecified: Secondary | ICD-10-CM | POA: Diagnosis not present

## 2016-03-02 DIAGNOSIS — Z79899 Other long term (current) drug therapy: Secondary | ICD-10-CM

## 2016-03-02 DIAGNOSIS — Z87891 Personal history of nicotine dependence: Secondary | ICD-10-CM | POA: Diagnosis not present

## 2016-03-02 DIAGNOSIS — Z823 Family history of stroke: Secondary | ICD-10-CM | POA: Diagnosis not present

## 2016-03-02 DIAGNOSIS — M542 Cervicalgia: Secondary | ICD-10-CM | POA: Diagnosis not present

## 2016-03-02 DIAGNOSIS — Z888 Allergy status to other drugs, medicaments and biological substances status: Secondary | ICD-10-CM | POA: Diagnosis not present

## 2016-03-02 DIAGNOSIS — R52 Pain, unspecified: Secondary | ICD-10-CM | POA: Diagnosis not present

## 2016-03-02 DIAGNOSIS — S72145S Nondisplaced intertrochanteric fracture of left femur, sequela: Secondary | ICD-10-CM | POA: Diagnosis not present

## 2016-03-02 DIAGNOSIS — D709 Neutropenia, unspecified: Secondary | ICD-10-CM | POA: Diagnosis present

## 2016-03-02 DIAGNOSIS — Z9012 Acquired absence of left breast and nipple: Secondary | ICD-10-CM | POA: Diagnosis not present

## 2016-03-02 DIAGNOSIS — E860 Dehydration: Secondary | ICD-10-CM | POA: Diagnosis present

## 2016-03-02 DIAGNOSIS — N179 Acute kidney failure, unspecified: Secondary | ICD-10-CM | POA: Diagnosis present

## 2016-03-02 DIAGNOSIS — R609 Edema, unspecified: Secondary | ICD-10-CM | POA: Diagnosis not present

## 2016-03-02 DIAGNOSIS — M25519 Pain in unspecified shoulder: Secondary | ICD-10-CM | POA: Diagnosis not present

## 2016-03-02 DIAGNOSIS — R030 Elevated blood-pressure reading, without diagnosis of hypertension: Secondary | ICD-10-CM | POA: Diagnosis not present

## 2016-03-02 DIAGNOSIS — E46 Unspecified protein-calorie malnutrition: Secondary | ICD-10-CM | POA: Diagnosis not present

## 2016-03-02 DIAGNOSIS — M24551 Contracture, right hip: Secondary | ICD-10-CM | POA: Diagnosis not present

## 2016-03-02 DIAGNOSIS — R269 Unspecified abnormalities of gait and mobility: Secondary | ICD-10-CM | POA: Diagnosis present

## 2016-03-02 LAB — CBC WITH DIFFERENTIAL/PLATELET
Basophils Absolute: 0 10*3/uL (ref 0.0–0.1)
Basophils Relative: 0 %
EOS ABS: 0 10*3/uL (ref 0.0–0.7)
EOS PCT: 0 %
HCT: 35.9 % — ABNORMAL LOW (ref 36.0–46.0)
Hemoglobin: 11.7 g/dL — ABNORMAL LOW (ref 12.0–15.0)
LYMPHS ABS: 0.4 10*3/uL — AB (ref 0.7–4.0)
LYMPHS PCT: 5 %
MCH: 29.4 pg (ref 26.0–34.0)
MCHC: 32.6 g/dL (ref 30.0–36.0)
MCV: 90.2 fL (ref 78.0–100.0)
MONOS PCT: 6 %
Monocytes Absolute: 0.5 10*3/uL (ref 0.1–1.0)
Neutro Abs: 7.4 10*3/uL (ref 1.7–7.7)
Neutrophils Relative %: 89 %
PLATELETS: 145 10*3/uL — AB (ref 150–400)
RBC: 3.98 MIL/uL (ref 3.87–5.11)
RDW: 13.9 % (ref 11.5–15.5)
WBC: 8.4 10*3/uL (ref 4.0–10.5)

## 2016-03-02 LAB — BASIC METABOLIC PANEL
Anion gap: 6 (ref 5–15)
BUN: 28 mg/dL — AB (ref 6–20)
CHLORIDE: 106 mmol/L (ref 101–111)
CO2: 26 mmol/L (ref 22–32)
CREATININE: 1.22 mg/dL — AB (ref 0.44–1.00)
Calcium: 10.1 mg/dL (ref 8.9–10.3)
GFR calc Af Amer: 45 mL/min — ABNORMAL LOW (ref >60)
GFR, EST NON AFRICAN AMERICAN: 39 mL/min — AB (ref >60)
GLUCOSE: 154 mg/dL — AB (ref 65–99)
POTASSIUM: 4 mmol/L (ref 3.5–5.1)
SODIUM: 138 mmol/L (ref 135–145)

## 2016-03-02 MED ORDER — SODIUM CHLORIDE 0.9 % IV BOLUS (SEPSIS)
1000.0000 mL | Freq: Once | INTRAVENOUS | Status: AC
  Administered 2016-03-03: 1000 mL via INTRAVENOUS

## 2016-03-02 MED ORDER — ESCITALOPRAM OXALATE 10 MG PO TABS
10.0000 mg | ORAL_TABLET | Freq: Every day | ORAL | Status: DC
  Administered 2016-03-03 – 2016-03-06 (×4): 10 mg via ORAL
  Filled 2016-03-02 (×4): qty 1

## 2016-03-02 MED ORDER — FENTANYL CITRATE (PF) 100 MCG/2ML IJ SOLN
50.0000 ug | Freq: Once | INTRAMUSCULAR | Status: AC
  Administered 2016-03-02 – 2016-03-03 (×2): 50 ug via INTRAVENOUS
  Filled 2016-03-02: qty 2

## 2016-03-02 MED ORDER — HYDROCHLOROTHIAZIDE 25 MG PO TABS: 25.0000 mg | ORAL_TABLET | Freq: Every day | ORAL | Status: DC

## 2016-03-02 MED ORDER — POLYETHYLENE GLYCOL 3350 17 G PO PACK
17.0000 g | PACK | Freq: Every day | ORAL | Status: DC | PRN
  Administered 2016-03-04 – 2016-03-05 (×2): 17 g via ORAL
  Filled 2016-03-02 (×2): qty 1

## 2016-03-02 MED ORDER — FENOFIBRATE 160 MG PO TABS
160.0000 mg | ORAL_TABLET | Freq: Every day | ORAL | Status: DC
Start: 2016-03-03 — End: 2016-03-06
  Administered 2016-03-03 – 2016-03-06 (×4): 160 mg via ORAL
  Filled 2016-03-02 (×4): qty 1

## 2016-03-02 MED ORDER — MORPHINE SULFATE (PF) 2 MG/ML IV SOLN
0.5000 mg | INTRAVENOUS | Status: DC | PRN
  Administered 2016-03-03 – 2016-03-04 (×5): 0.5 mg via INTRAVENOUS
  Filled 2016-03-02 (×5): qty 1

## 2016-03-02 MED ORDER — METOPROLOL SUCCINATE ER 25 MG PO TB24
25.0000 mg | ORAL_TABLET | Freq: Every day | ORAL | Status: DC
  Administered 2016-03-03 – 2016-03-04 (×2): 25 mg via ORAL
  Filled 2016-03-02 (×2): qty 1

## 2016-03-02 MED ORDER — BISACODYL 10 MG RE SUPP: 10.0000 mg | Freq: Every day | RECTAL | Status: DC | PRN

## 2016-03-02 MED ORDER — DOCUSATE SODIUM 100 MG PO CAPS
200.0000 mg | ORAL_CAPSULE | Freq: Every day | ORAL | Status: DC | PRN
  Administered 2016-03-04 – 2016-03-05 (×2): 200 mg via ORAL
  Filled 2016-03-02 (×2): qty 2

## 2016-03-02 MED ORDER — ROSUVASTATIN CALCIUM 10 MG PO TABS
10.0000 mg | ORAL_TABLET | Freq: Every day | ORAL | Status: DC
  Administered 2016-03-03 – 2016-03-06 (×4): 10 mg via ORAL
  Filled 2016-03-02 (×4): qty 1

## 2016-03-02 MED ORDER — COLCHICINE 0.6 MG PO TABS
0.6000 mg | ORAL_TABLET | Freq: Every day | ORAL | Status: DC
  Administered 2016-03-03 – 2016-03-06 (×4): 0.6 mg via ORAL
  Filled 2016-03-02 (×4): qty 1

## 2016-03-02 MED ORDER — HYDROCODONE-ACETAMINOPHEN 5-325 MG PO TABS
1.0000 | ORAL_TABLET | Freq: Four times a day (QID) | ORAL | Status: DC | PRN
  Administered 2016-03-03: 2 via ORAL
  Filled 2016-03-02: qty 2

## 2016-03-02 MED ORDER — AMLODIPINE BESYLATE 5 MG PO TABS
5.0000 mg | ORAL_TABLET | Freq: Every day | ORAL | Status: DC
  Administered 2016-03-03: 5 mg via ORAL
  Filled 2016-03-02 (×2): qty 1

## 2016-03-02 NOTE — Progress Notes (Signed)
Report given to Roselyn Reef, RN to resume care.

## 2016-03-02 NOTE — ED Triage Notes (Signed)
Pt presents to the ed after a mechanical fall, patient landed on her left hip and her left leg is shortened and externally rotated. Pt received 30 mg of toradol iv with ems for pain. Pt is alert and oriented and denies any loc or head trauma

## 2016-03-02 NOTE — Progress Notes (Signed)
Patient arrived to floor via stretcher from ED with family at bedside. Alert and oriented, pain 7/10 at L hip when moving. Patient to be further assessed.
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
## 2016-03-03 ENCOUNTER — Inpatient Hospital Stay (HOSPITAL_COMMUNITY): Payer: Medicare Other

## 2016-03-03 ENCOUNTER — Inpatient Hospital Stay (HOSPITAL_COMMUNITY): Payer: Medicare Other | Admitting: Certified Registered Nurse Anesthetist

## 2016-03-03 ENCOUNTER — Encounter (HOSPITAL_COMMUNITY)
Admission: EM | Disposition: A | Discharge status: Rehabilitation Facility | Payer: Self-pay | Source: Home / Self Care | Attending: Family Medicine

## 2016-03-03 DIAGNOSIS — S72142A Displaced intertrochanteric fracture of left femur, initial encounter for closed fracture: Secondary | ICD-10-CM

## 2016-03-03 HISTORY — PX: FEMUR IM NAIL: SHX1597

## 2016-03-03 LAB — PROTIME-INR
INR: 1.1
PROTHROMBIN TIME: 14.2 s (ref 11.4–15.2)

## 2016-03-03 LAB — FERRITIN: FERRITIN: 93 ng/mL (ref 11–307)

## 2016-03-03 LAB — PREPARE RBC (CROSSMATCH)

## 2016-03-03 LAB — CBC
HCT: 31.8 % — ABNORMAL LOW (ref 36.0–46.0)
Hemoglobin: 10.5 g/dL — ABNORMAL LOW (ref 12.0–15.0)
MCH: 29.2 pg (ref 26.0–34.0)
MCHC: 33 g/dL (ref 30.0–36.0)
MCV: 88.3 fL (ref 78.0–100.0)
PLATELETS: 146 10*3/uL — AB (ref 150–400)
RBC: 3.6 MIL/uL — ABNORMAL LOW (ref 3.87–5.11)
RDW: 13.4 % (ref 11.5–15.5)
WBC: 5.4 10*3/uL (ref 4.0–10.5)

## 2016-03-03 LAB — BASIC METABOLIC PANEL
Anion gap: 8 (ref 5–15)
BUN: 29 mg/dL — AB (ref 6–20)
CHLORIDE: 108 mmol/L (ref 101–111)
CO2: 24 mmol/L (ref 22–32)
CREATININE: 1.02 mg/dL — AB (ref 0.44–1.00)
Calcium: 9.4 mg/dL (ref 8.9–10.3)
GFR calc Af Amer: 56 mL/min — ABNORMAL LOW (ref >60)
GFR, EST NON AFRICAN AMERICAN: 48 mL/min — AB (ref >60)
GLUCOSE: 117 mg/dL — AB (ref 65–99)
Potassium: 4 mmol/L (ref 3.5–5.1)
SODIUM: 140 mmol/L (ref 135–145)

## 2016-03-03 LAB — APTT: APTT: 26 s (ref 24–36)

## 2016-03-03 LAB — MRSA PCR SCREENING: MRSA by PCR: POSITIVE — AB

## 2016-03-03 SURGERY — INSERTION, INTRAMEDULLARY ROD, FEMUR
Anesthesia: Monitor Anesthesia Care | Site: Hip | Laterality: Left

## 2016-03-03 MED ORDER — CEFAZOLIN SODIUM-DEXTROSE 2-4 GM/100ML-% IV SOLN
INTRAVENOUS | Status: AC
  Filled 2016-03-03: qty 100

## 2016-03-03 MED ORDER — ENOXAPARIN SODIUM 30 MG/0.3ML ~~LOC~~ SOLN
30.0000 mg | SUBCUTANEOUS | Status: DC
  Administered 2016-03-04 – 2016-03-06 (×3): 30 mg via SUBCUTANEOUS
  Filled 2016-03-03 (×3): qty 0.3

## 2016-03-03 MED ORDER — FENTANYL CITRATE (PF) 100 MCG/2ML IJ SOLN
INTRAMUSCULAR | Status: AC
  Filled 2016-03-03: qty 2

## 2016-03-03 MED ORDER — METOCLOPRAMIDE HCL 5 MG PO TABS: 5.0000 mg | ORAL_TABLET | Freq: Three times a day (TID) | ORAL | Status: DC | PRN

## 2016-03-03 MED ORDER — ONDANSETRON HCL 4 MG PO TABS: 4.0000 mg | ORAL_TABLET | Freq: Four times a day (QID) | ORAL | Status: DC | PRN

## 2016-03-03 MED ORDER — CHLORHEXIDINE GLUCONATE CLOTH 2 % EX PADS
6.0000 | MEDICATED_PAD | Freq: Every day | CUTANEOUS | Status: DC
  Administered 2016-03-03: 6 via TOPICAL

## 2016-03-03 MED ORDER — METOCLOPRAMIDE HCL 5 MG/ML IJ SOLN: 5.0000 mg | Freq: Three times a day (TID) | INTRAMUSCULAR | Status: DC | PRN

## 2016-03-03 MED ORDER — LIDOCAINE HCL (CARDIAC) 20 MG/ML IV SOLN
INTRAVENOUS | Status: DC | PRN
  Administered 2016-03-03: 30 mg via INTRAVENOUS

## 2016-03-03 MED ORDER — FENTANYL CITRATE (PF) 100 MCG/2ML IJ SOLN
INTRAMUSCULAR | Status: AC
  Administered 2016-03-03: 50 ug via INTRAVENOUS
  Filled 2016-03-03: qty 2

## 2016-03-03 MED ORDER — CEFAZOLIN IN D5W 1 GM/50ML IV SOLN
1.0000 g | Freq: Four times a day (QID) | INTRAVENOUS | Status: AC
  Administered 2016-03-03 – 2016-03-04 (×3): 1 g via INTRAVENOUS
  Filled 2016-03-03 (×3): qty 50

## 2016-03-03 MED ORDER — ONDANSETRON HCL 4 MG/2ML IJ SOLN
4.0000 mg | Freq: Four times a day (QID) | INTRAMUSCULAR | Status: DC | PRN
  Filled 2016-03-03: qty 2

## 2016-03-03 MED ORDER — ALBUMIN HUMAN 5 % IV SOLN
INTRAVENOUS | Status: DC | PRN
  Administered 2016-03-03: 18:00:00 via INTRAVENOUS

## 2016-03-03 MED ORDER — GLYCOPYRROLATE 0.2 MG/ML IJ SOLN
INTRAMUSCULAR | Status: DC | PRN
  Administered 2016-03-03: 0.4 mg via INTRAVENOUS

## 2016-03-03 MED ORDER — LACTATED RINGERS IV SOLN
INTRAVENOUS | Status: DC | PRN
  Administered 2016-03-03 (×2): via INTRAVENOUS

## 2016-03-03 MED ORDER — ONDANSETRON HCL 4 MG/2ML IJ SOLN
INTRAMUSCULAR | Status: AC
  Filled 2016-03-03: qty 2

## 2016-03-03 MED ORDER — ONDANSETRON HCL 4 MG/2ML IJ SOLN
INTRAMUSCULAR | Status: DC | PRN
Start: 2016-03-03 — End: 2016-03-03
  Administered 2016-03-03: 4 mg via INTRAVENOUS

## 2016-03-03 MED ORDER — BUPIVACAINE HCL (PF) 0.5 % IJ SOLN
INTRAMUSCULAR | Status: DC | PRN
  Administered 2016-03-03: 3 mL via INTRATHECAL

## 2016-03-03 MED ORDER — ENSURE ENLIVE PO LIQD
237.0000 mL | Freq: Every day | ORAL | Status: DC
  Administered 2016-03-05: 237 mL via ORAL
  Filled 2016-03-03: qty 237

## 2016-03-03 MED ORDER — ENOXAPARIN SODIUM 30 MG/0.3ML ~~LOC~~ SOLN: 30.0000 mg | SUBCUTANEOUS | 0 refills | Status: DC

## 2016-03-03 MED ORDER — PHENYLEPHRINE HCL 10 MG/ML IJ SOLN
INTRAVENOUS | Status: DC | PRN
  Administered 2016-03-03: 40 ug/min via INTRAVENOUS

## 2016-03-03 MED ORDER — 0.9 % SODIUM CHLORIDE (POUR BTL) OPTIME
TOPICAL | Status: DC | PRN
  Administered 2016-03-03: 1000 mL

## 2016-03-03 MED ORDER — CEFAZOLIN SODIUM-DEXTROSE 2-3 GM-% IV SOLR
INTRAVENOUS | Status: DC | PRN
  Administered 2016-03-03: 2 g via INTRAVENOUS

## 2016-03-03 MED ORDER — SODIUM CHLORIDE 0.9 % IV SOLN
Freq: Once | INTRAVENOUS | Status: AC
  Administered 2016-03-04: 10 mL/h via INTRAVENOUS

## 2016-03-03 MED ORDER — FENTANYL CITRATE (PF) 100 MCG/2ML IJ SOLN: 25.0000 ug | INTRAMUSCULAR | Status: DC | PRN

## 2016-03-03 MED ORDER — PROPOFOL 10 MG/ML IV BOLUS
INTRAVENOUS | Status: DC | PRN
  Administered 2016-03-03: 40 mg via INTRAVENOUS

## 2016-03-03 SURGICAL SUPPLY — 35 items
CLOSURE STERI-STRIP 1/2X4 (GAUZE/BANDAGES/DRESSINGS) ×1
CLSR STERI-STRIP ANTIMIC 1/2X4 (GAUZE/BANDAGES/DRESSINGS) ×2 IMPLANT
COVER PERINEAL POST (MISCELLANEOUS) ×3 IMPLANT
COVER SURGICAL LIGHT HANDLE (MISCELLANEOUS) ×3 IMPLANT
DRAPE STERI IOBAN 125X83 (DRAPES) ×3 IMPLANT
DRSG MEPILEX BORDER 4X4 (GAUZE/BANDAGES/DRESSINGS) ×6 IMPLANT
DURAPREP 26ML APPLICATOR (WOUND CARE) ×3 IMPLANT
ELECT REM PT RETURN 9FT ADLT (ELECTROSURGICAL) ×3
ELECTRODE REM PT RTRN 9FT ADLT (ELECTROSURGICAL) ×1 IMPLANT
GLOVE BIO SURGEON STRL SZ7.5 (GLOVE) ×6 IMPLANT
GLOVE BIOGEL PI IND STRL 8 (GLOVE) ×2 IMPLANT
GLOVE BIOGEL PI INDICATOR 8 (GLOVE) ×4
GLOVE SURG SS PI 6.5 STRL IVOR (GLOVE) ×2 IMPLANT
GLOVE SURG SS PI 7.5 STRL IVOR (GLOVE) ×2 IMPLANT
GOWN STRL REUS W/ TWL LRG LVL3 (GOWN DISPOSABLE) ×3 IMPLANT
GOWN STRL REUS W/TWL LRG LVL3 (GOWN DISPOSABLE) ×9
GUIDEROD T2 3X1000 (ROD) ×2 IMPLANT
K-WIRE  3.2X450M STR (WIRE) ×2
K-WIRE 3.2X450M STR (WIRE) ×1
KIT BASIN OR (CUSTOM PROCEDURE TRAY) ×3 IMPLANT
KIT NAIL LONG 10X140X125 (Nail) ×2 IMPLANT
KIT ROOM TURNOVER OR (KITS) ×3 IMPLANT
KWIRE 3.2X450M STR (WIRE) IMPLANT
MANIFOLD NEPTUNE II (INSTRUMENTS) ×3 IMPLANT
NS IRRIG 1000ML POUR BTL (IV SOLUTION) ×3 IMPLANT
PACK GENERAL/GYN (CUSTOM PROCEDURE TRAY) ×3 IMPLANT
PAD ARMBOARD 7.5X6 YLW CONV (MISCELLANEOUS) ×6 IMPLANT
SCREW LAG GAMMA 3 TI 10.5X100M (Screw) ×2 IMPLANT
SUT MNCRL AB 4-0 PS2 18 (SUTURE) IMPLANT
SUT MON AB 2-0 CT1 36 (SUTURE) IMPLANT
SUT VIC AB 0 CT1 27 (SUTURE) ×6
SUT VIC AB 0 CT1 27XBRD ANBCTR (SUTURE) ×1 IMPLANT
TOWEL OR 17X24 6PK STRL BLUE (TOWEL DISPOSABLE) ×3 IMPLANT
TOWEL OR 17X26 10 PK STRL BLUE (TOWEL DISPOSABLE) ×3 IMPLANT
WATER STERILE IRR 1000ML POUR (IV SOLUTION) ×3 IMPLANT

## 2016-03-03 NOTE — Op Note (Signed)
DATE OF SURGERY:  03/03/2016  TIME: 5:21 PM  PATIENT NAME:  Duffy Bruce  AGE: 82 y.o.  PRE-OPERATIVE DIAGNOSIS:  Fractured Left Hip  POST-OPERATIVE DIAGNOSIS:  SAME  PROCEDURE:  INTRAMEDULLARY (IM) NAIL FEMORAL  SURGEON:  Falyn Rubel D  ASSISTANT:  Roxan Hockey, PA-C, he was present and scrubbed throughout the case, critical for completion in a timely fashion, and for retraction, instrumentation, and closure.   OPERATIVE IMPLANTS: [employer]ker Gamma Nail  PREOPERATIVE INDICATIONS:  LIEL LERER is a 82 y.o. year old who fell and suffered a hip fracture. She was brought into the ER and then admitted and optimized and then elected for surgical intervention.    The risks benefits and alternatives were discussed with the patient including but not limited to the risks of nonoperative treatment, versus surgical intervention including infection, bleeding, nerve injury, malunion, nonunion, hardware prominence, hardware failure, need for hardware removal, blood clots, cardiopulmonary complications, morbidity, mortality, among others, and they were willing to proceed.    OPERATIVE PROCEDURE:  The patient was brought to the operating room and placed in the supine position. General anesthesia was administered. She was placed on the fracture table.  Closed reduction was performed under C-arm guidance. Time out was then performed after sterile prep and drape. She received preoperative antibiotics.  Incision was made proximal to the greater trochanter. A guidewire was placed in the appropriate position. Confirmation was made on AP and lateral views. The above-named nail was opened. I opened the proximal femur with a reamer. I then placed the nail by hand easily down. I did not need to ream the femur.  Once the nail was completely seated, I placed a guidepin into the femoral head into the center center position. I measured the length, and then reamed the lateral cortex and up into  the head. I then placed the lag screw. Slight compression was applied. Anatomic fixation achieved. Bone quality was mediocre.  I then secured the proximal interlocking bolt, and took off a half a turn, and then removed the instruments, and took final C-arm pictures AP and lateral the entire length of the leg.   Anatomic reconstruction was achieved, and the wounds were irrigated copiously and closed with Vicryl followed by staples and sterile gauze for the skin. The patient was awakened and returned to PACU in stable and satisfactory condition. There no complications and the patient tolerated the procedure well.  She will be weightbearing as tolerated, and will be on chemical px  for a period of four weeks after discharge.   Edmonia Lynch, M.D.

## 2016-03-03 NOTE — H&P (View-Only) (Signed)
ORTHOPAEDIC CONSULTATION  REQUESTING PHYSICIAN: Orson Eva, MD  Chief Complaint: L hip fracture  HPI: [name] is a 82 y.o. female who complains of a mechanical fall with pains of left lower leg. Prior to this she used a walker to walk short distances in a wheelchair for longer distances  Past Medical History:  Diagnosis Date  . Abdominal aortic aneurysm ([place])   . Arthritis   . Cancer (HCC)    Left Breast , Dr. Cherylann Banas  . Colon polyps    Benign  . Complication of anesthesia   . Gout   . H/O hiatal hernia   . Hyperlipidemia   . Hypertension    sees Dr. Edrick Oh, saw last approx. 1 month ago  . Osteoporosis   . PONV (postoperative nausea and vomiting)   . Psoriasis   . Splenic artery aneurysm [facility])    Past Surgical History:  Procedure Laterality Date  . ABDOMINAL AORTIC ANEURYSM REPAIR  09/09/2009   Dacron Graft repair  by Dr. Ruta Hinds  . EYE SURGERY     left cataract removal  . HERNIA REPAIR    . JOINT REPLACEMENT  2008   Right Total Hip  . MASTECTOMY  08/22/2001   Left  . ORIF DISTAL RADIUS FRACTURE     Social History   Social History  . Marital status: Widowed    Spouse name: N/A  . Number of children: N/A  . Years of education: N/A   Social History Main Topics  . Smoking status: Former Smoker    Years: 15.00    Types: Cigarettes    Quit date: 03/16/2004  . Smokeless tobacco: Never Used  . Alcohol use No  . Drug use: No  . Sexual activity: Not Asked   Other Topics Concern  . None   Social History Narrative  . None   Family History  Problem Relation Age of Onset  . Cancer Sister     ovarian cancer  . Stroke Sister   . Cancer Brother     Lung Cancer   Allergies  Allergen Reactions  . Prednisone Swelling  . Sulfa Antibiotics Swelling   Prior to Admission medications   Medication Sig Start Date End Date Taking? Authorizing Provider  amLODipine (NORVASC) 5 MG tablet Take 5 mg by mouth daily.     Yes Historical Provider,  MD  colchicine 0.6 MG tablet Take 0.6 mg by mouth daily.     Yes Historical Provider, MD  docusate sodium (COLACE) 100 MG capsule Take 200 mg by mouth daily as needed for moderate constipation.    Yes Historical Provider, MD  escitalopram (LEXAPRO) 10 MG tablet Take 10 mg by mouth daily.     Yes Historical Provider, MD  fenofibrate (TRICOR) 145 MG tablet Take 145 mg by mouth daily.   Yes Historical Provider, MD  hydrochlorothiazide (HYDRODIURIL) 25 MG tablet Take 25 mg by mouth daily. 02/17/16  Yes Historical Provider, MD  HYDROcodone-acetaminophen (NORCO/VICODIN) 5-325 MG tablet Take 1 tablet by mouth every 6 (six) hours as needed for pain. 02/03/16 02/02/17 Yes Historical Provider, MD  metoprolol succinate (TOPROL-XL) 25 MG 24 hr tablet Take 25 mg by mouth daily.     Yes Historical Provider, MD  rosuvastatin (CRESTOR) 10 MG tablet Take 10 mg by mouth daily.     Yes Historical Provider, MD  telmisartan (MICARDIS) 80 MG tablet Take 80 mg by mouth daily.     Yes Historical Provider, MD   Dg Pelvis  1-2 Views  Result Date: 03/02/2016 CLINICAL DATA:  [name] in her kitchen today after losing balance. EXAM: PELVIS - 1-2 VIEW COMPARISON:  None. FINDINGS: There is a comminuted intertrochanteric left hip fracture with varus angulation. No dislocation. No radiographic findings to suggest a pathologic basis for the fracture. Bony pelvis appears intact. IMPRESSION: Intertrochanteric left hip fracture. Electronically Signed   By: Andreas Newport M.D.   On: 03/02/2016 21:47   Dg Femur Min 2 Views Left  Result Date: 03/02/2016 CLINICAL DATA:  Proximal femur pain and generalized left hip pain after a fall today EXAM: LEFT FEMUR 2 VIEWS COMPARISON:  CT 03/20/2015 FINDINGS: Pubic symphysis appears intact. Comminuted inter trochanteric fracture of the proximal left femur with displaced lesser trochanteric fracture fragment toward the midline. Superior migration/displacement of the distal femoral fragment/shaft. The  left femoral head projects in joint. There are vascular calcifications. Degenerative changes of the left knee. IMPRESSION: Comminuted inter trochanteric fracture of the proximal left femur. Electronically Signed   By: Donavan Foil M.D.   On: 03/02/2016 21:50    Positive ROS: All other systems have been reviewed and were otherwise negative with the exception of those mentioned in the HPI and as above.  Labs cbc  Recent Labs  03/02/16 2100 03/03/16 0507  WBC 8.4 5.4  HGB 11.7* 10.5*  HCT 35.9* 31.8*  PLT 145* 146*    Labs inflam No results for input(s): CRP in the last 72 hours.  Invalid input(s): ESR  Labs coag  Recent Labs  03/03/16 0100  INR 1.10     Recent Labs  03/02/16 2100 03/03/16 0507  NA 138 140  K 4.0 4.0  CL 106 108  CO2 26 24  GLUCOSE 154* 117*  BUN 28* 29*  CREATININE 1.22* 1.02*  CALCIUM 10.1 9.4    Physical Exam: Vitals:   03/02/16 2330 03/03/16 0707  BP: (!) 132/99 (!) 155/82  Pulse: 92 76  Resp: 18 18  Temp: 97.9 F (36.6 C) 98.1 F (36.7 C)   General: Alert, no acute distress Cardiovascular: No pedal edema Respiratory: No cyanosis, no use of accessory musculature GI: No organomegaly, abdomen is soft and non-tender Skin: No lesions in the area of chief complaint other than those listed below in MSK exam.  Neurologic: Sensation intact distally save for the below mentioned MSK exam Psychiatric: Patient is competent for consent with normal mood and affect Lymphatic: No axillary or cervical lymphadenopathy  MUSCULOSKELETAL:  Left lower extremity she has pain with any range of motion her skin is benign she is neurovascular intact distally Other extremities are atraumatic with painless ROM and NVI.  Assessment: L intertroch fracture  Plan: Nothing by mouth for OR today for IM nail  Weightbearing as tolerated postop We'll address DVT prophylaxis postop   Renette Butters, MD Cell [phone]   03/03/2016 9:09 AM

## 2016-03-03 NOTE — Anesthesia Preprocedure Evaluation (Signed)
Anesthesia Evaluation  Patient identified by MRN, date of birth, ID band Patient awake    Reviewed: Allergy & Precautions, NPO status , Patient's Chart, lab work & pertinent test results, reviewed documented beta blocker date and time   History of Anesthesia Complications (+) PONV  Airway Mallampati: II  TM Distance: >3 FB Neck ROM: Full    Dental  (+) Dental Advisory Given   Pulmonary former smoker,    breath sounds clear to auscultation       Cardiovascular hypertension, Pt. on medications and Pt. on home beta blockers + Peripheral Vascular Disease   Rhythm:Regular Rate:Normal     Neuro/Psych negative neurological ROS     GI/Hepatic Neg liver ROS, hiatal hernia,   Endo/Other  negative endocrine ROS  Renal/GU Renal InsufficiencyRenal disease     Musculoskeletal  (+) Arthritis ,   Abdominal   Peds  Hematology  (+) anemia ,   Anesthesia Other Findings   Reproductive/Obstetrics                             Lab Results  Component Value Date   WBC 5.4 03/03/2016   HGB 10.5 (L) 03/03/2016   HCT 31.8 (L) 03/03/2016   MCV 88.3 03/03/2016   PLT 146 (L) 03/03/2016   Lab Results  Component Value Date   CREATININE 1.02 (H) 03/03/2016   BUN 29 (H) 03/03/2016   NA 140 03/03/2016   K 4.0 03/03/2016   CL 108 03/03/2016   CO2 24 03/03/2016   Lab Results  Component Value Date   INR 1.10 03/03/2016   INR 0.99 03/20/2011   INR 1.50 (H) 09/09/2009    Anesthesia Physical Anesthesia Plan  ASA: III  Anesthesia Plan: Spinal and MAC   Post-op Pain Management:    Induction: Intravenous  Airway Management Planned: Natural Airway and Simple Face Mask  Additional Equipment:   Intra-op Plan:   Post-operative Plan:   Informed Consent: I have reviewed the patients History and Physical, chart, labs and discussed the procedure including the risks, benefits and alternatives for the  proposed anesthesia with the patient or authorized representative who has indicated his/her understanding and acceptance.     Plan Discussed with: CRNA  Anesthesia Plan Comments:         Anesthesia Quick Evaluation

## 2016-03-03 NOTE — Consult Note (Signed)
ORTHOPAEDIC CONSULTATION  REQUESTING PHYSICIAN: Orson Eva, MD  Chief Complaint: L hip fracture  HPI: [name] is a 82 y.o. female who complains of a mechanical fall with pains of left lower leg. Prior to this she used a walker to walk short distances in a wheelchair for longer distances  Past Medical History:  Diagnosis Date  . Abdominal aortic aneurysm ([place])   . Arthritis   . Cancer (HCC)    Left Breast , Dr. Cherylann Banas  . Colon polyps    Benign  . Complication of anesthesia   . Gout   . H/O hiatal hernia   . Hyperlipidemia   . Hypertension    sees Dr. Edrick Oh, saw last approx. 1 month ago  . Osteoporosis   . PONV (postoperative nausea and vomiting)   . Psoriasis   . Splenic artery aneurysm [facility])    Past Surgical History:  Procedure Laterality Date  . ABDOMINAL AORTIC ANEURYSM REPAIR  09/09/2009   Dacron Graft repair  by Dr. Ruta Hinds  . EYE SURGERY     left cataract removal  . HERNIA REPAIR    . JOINT REPLACEMENT  2008   Right Total Hip  . MASTECTOMY  08/22/2001   Left  . ORIF DISTAL RADIUS FRACTURE     Social History   Social History  . Marital status: Widowed    Spouse name: N/A  . Number of children: N/A  . Years of education: N/A   Social History Main Topics  . Smoking status: Former Smoker    Years: 15.00    Types: Cigarettes    Quit date: 03/16/2004  . Smokeless tobacco: Never Used  . Alcohol use No  . Drug use: No  . Sexual activity: Not Asked   Other Topics Concern  . None   Social History Narrative  . None   Family History  Problem Relation Age of Onset  . Cancer Sister     ovarian cancer  . Stroke Sister   . Cancer Brother     Lung Cancer   Allergies  Allergen Reactions  . Prednisone Swelling  . Sulfa Antibiotics Swelling   Prior to Admission medications   Medication Sig Start Date End Date Taking? Authorizing Provider  amLODipine (NORVASC) 5 MG tablet Take 5 mg by mouth daily.     Yes Historical Provider,  MD  colchicine 0.6 MG tablet Take 0.6 mg by mouth daily.     Yes Historical Provider, MD  docusate sodium (COLACE) 100 MG capsule Take 200 mg by mouth daily as needed for moderate constipation.    Yes Historical Provider, MD  escitalopram (LEXAPRO) 10 MG tablet Take 10 mg by mouth daily.     Yes Historical Provider, MD  fenofibrate (TRICOR) 145 MG tablet Take 145 mg by mouth daily.   Yes Historical Provider, MD  hydrochlorothiazide (HYDRODIURIL) 25 MG tablet Take 25 mg by mouth daily. 02/17/16  Yes Historical Provider, MD  HYDROcodone-acetaminophen (NORCO/VICODIN) 5-325 MG tablet Take 1 tablet by mouth every 6 (six) hours as needed for pain. 02/03/16 02/02/17 Yes Historical Provider, MD  metoprolol succinate (TOPROL-XL) 25 MG 24 hr tablet Take 25 mg by mouth daily.     Yes Historical Provider, MD  rosuvastatin (CRESTOR) 10 MG tablet Take 10 mg by mouth daily.     Yes Historical Provider, MD  telmisartan (MICARDIS) 80 MG tablet Take 80 mg by mouth daily.     Yes Historical Provider, MD   Dg Pelvis  1-2 Views  Result Date: 03/02/2016 CLINICAL DATA:  [name] in her kitchen today after losing balance. EXAM: PELVIS - 1-2 VIEW COMPARISON:  None. FINDINGS: There is a comminuted intertrochanteric left hip fracture with varus angulation. No dislocation. No radiographic findings to suggest a pathologic basis for the fracture. Bony pelvis appears intact. IMPRESSION: Intertrochanteric left hip fracture. Electronically Signed   By: Andreas Newport M.D.   On: 03/02/2016 21:47   Dg Femur Min 2 Views Left  Result Date: 03/02/2016 CLINICAL DATA:  Proximal femur pain and generalized left hip pain after a fall today EXAM: LEFT FEMUR 2 VIEWS COMPARISON:  CT 03/20/2015 FINDINGS: Pubic symphysis appears intact. Comminuted inter trochanteric fracture of the proximal left femur with displaced lesser trochanteric fracture fragment toward the midline. Superior migration/displacement of the distal femoral fragment/shaft. The  left femoral head projects in joint. There are vascular calcifications. Degenerative changes of the left knee. IMPRESSION: Comminuted inter trochanteric fracture of the proximal left femur. Electronically Signed   By: Donavan Foil M.D.   On: 03/02/2016 21:50    Positive ROS: All other systems have been reviewed and were otherwise negative with the exception of those mentioned in the HPI and as above.  Labs cbc  Recent Labs  03/02/16 2100 03/03/16 0507  WBC 8.4 5.4  HGB 11.7* 10.5*  HCT 35.9* 31.8*  PLT 145* 146*    Labs inflam No results for input(s): CRP in the last 72 hours.  Invalid input(s): ESR  Labs coag  Recent Labs  03/03/16 0100  INR 1.10     Recent Labs  03/02/16 2100 03/03/16 0507  NA 138 140  K 4.0 4.0  CL 106 108  CO2 26 24  GLUCOSE 154* 117*  BUN 28* 29*  CREATININE 1.22* 1.02*  CALCIUM 10.1 9.4    Physical Exam: Vitals:   03/02/16 2330 03/03/16 0707  BP: (!) 132/99 (!) 155/82  Pulse: 92 76  Resp: 18 18  Temp: 97.9 F (36.6 C) 98.1 F (36.7 C)   General: Alert, no acute distress Cardiovascular: No pedal edema Respiratory: No cyanosis, no use of accessory musculature GI: No organomegaly, abdomen is soft and non-tender Skin: No lesions in the area of chief complaint other than those listed below in MSK exam.  Neurologic: Sensation intact distally save for the below mentioned MSK exam Psychiatric: Patient is competent for consent with normal mood and affect Lymphatic: No axillary or cervical lymphadenopathy  MUSCULOSKELETAL:  Left lower extremity she has pain with any range of motion her skin is benign she is neurovascular intact distally Other extremities are atraumatic with painless ROM and NVI.  Assessment: L intertroch fracture  Plan: Nothing by mouth for OR today for IM nail  Weightbearing as tolerated postop We'll address DVT prophylaxis postop   Renette Butters, MD Cell [phone]   03/03/2016 9:09 AM

## 2016-03-03 NOTE — Transfer of Care (Signed)
Immediate Anesthesia Transfer of Care Note  Patient: [name]  Procedure(s) Performed: Procedure(s): INTRAMEDULLARY (IM) NAIL FEMORAL (Left)  Patient Location: PACU  Anesthesia Type:Spinal  Level of Consciousness: awake, alert , oriented and sedated  Airway & Oxygen Therapy: Patient Spontanous Breathing and Patient connected to nasal cannula oxygen  Post-op Assessment: Report given to RN and Post -op Vital signs reviewed and stable  Post vital signs: Reviewed and stable  Last Vitals:  Vitals:   03/03/16 0707 03/03/16 1325  BP: (!) 155/82 (!) 140/93  Pulse: 76 67  Resp: 18 17  Temp: 36.7 C 36.8 C    Last Pain:  Vitals:   03/03/16 1805  TempSrc:   PainSc: (P) 0-No pain      Patients Stated Pain Goal: 3 ([id number])  Complications: No apparent anesthesia complications

## 2016-03-03 NOTE — Progress Notes (Signed)
PROGRESS NOTE  [name] [id number] DOB: [birth year] DOA: 03/02/2016 PCP: Sherrie Mustache, MD  Brief History:  82 y/o female with history of HTN, Breast cancer, AAA, HLD presented with a mechanical fall.  She thinks she slipped and lost balance and fell after unloading some onions in her pantry.  There was no loss of consciousness. The patient denied any chest discomfort, shortness of breath, dizziness. She had immediate left hip pain and was unable to get up. She laid on her floor for about 3 hours until her daughter found her. EMS was activated. X-rays revealed a comminuted left hip intertrochanteric fracture. Orthopedics was consulted and the patient was admitted for definitive care. Prior to her fall, the patient is able to ambulate with the assistance of a walker.  Assessment/Plan: Left comminuted intertrochanteric fracture -appreciate ortho consult -plans for OR 03/03/16 -she is low cardiac risk and no further evaluation will alter her risk at this time -pain control -PT/OT after surgery  HTN -holding telmisartan and HCTZ -continue metoprolol succinate and amlodipine -BP controlled  Hyperlipidemia -Continue statin  Dehydration -Mild elevation in creatinine -Improved with IV fluids  Normocytic anemia -Baseline hemoglobin 11-12 -Patient is at baseline at the time of admission -Stable for outpatient workup   Hyperglycemia -Hemoglobin A1c    Disposition Plan:   Home vs SNF in 1-2 days  Family Communication:   No Family at bedside  Consultants: Ortho-[place]   Code Status:  FULL  DVT Prophylaxis:  To be addressed by ortho post-op   Procedures: As Listed in Progress Note Above  Antibiotics: None    Subjective:  patient complains of pain in the left hip. It is controlled with medications. Denies any fevers, chills, chest pain, breath, nausea, vomiting, diarrhea, abdominal pain.  Objective: Vitals:   03/02/16 2019 03/02/16  2200 03/02/16 2330 03/03/16 0707  BP: 152/83 132/78 (!) 132/99 (!) 155/82  Pulse: 79 88 92 76  Resp: 20  18 18   Temp: 97.9 F (36.6 C)  97.9 F (36.6 C) 98.1 F (36.7 C)  TempSrc: Oral  Oral Oral  SpO2: 100% 97% 99% 100%    Intake/Output Summary (Last 24 hours) at 03/03/16 0759 Last data filed at 03/03/16 0708  Gross per 24 hour  Intake                0 ml  Output              350 ml  Net             -350 ml   Weight change:  Exam:   General:  Pt is alert, follows commands appropriately, not in acute distress  HEENT: No icterus, No thrush, No neck mass, [place]/AT  Cardiovascular: RRR, S1/S2, no rubs, no gallops  Respiratory: CTA bilaterally, no wheezing, no crackles, no rhonchi  Abdomen: Soft/+BS, non tender, non distended, no guarding  Extremities: trace LE edema, No lymphangitis, No petechiae, No rashes, no synovitis   Data Reviewed: I have personally reviewed following labs and imaging studies Basic Metabolic Panel:  Recent Labs Lab 03/02/16 2100 03/03/16 0507  NA 138 140  K 4.0 4.0  CL 106 108  CO2 26 24  GLUCOSE 154* 117*  BUN 28* 29*  CREATININE 1.22* 1.02*  CALCIUM 10.1 9.4   Liver Function Tests: No results for input(s): AST, ALT, ALKPHOS, BILITOT, PROT, ALBUMIN in the last 168 hours. No results for input(s): LIPASE, AMYLASE in the last  168 hours. No results for input(s): AMMONIA in the last 168 hours. Coagulation Profile:  Recent Labs Lab 03/03/16 0100  INR 1.10   CBC:  Recent Labs Lab 03/02/16 2100 03/03/16 0507  WBC 8.4 5.4  NEUTROABS 7.4  --   HGB 11.7* 10.5*  HCT 35.9* 31.8*  MCV 90.2 88.3  PLT 145* 146*   Cardiac Enzymes: No results for input(s): CKTOTAL, CKMB, CKMBINDEX, TROPONINI in the last 168 hours. BNP: Invalid input(s): POCBNP CBG: No results for input(s): GLUCAP in the last 168 hours. HbA1C: No results for input(s): HGBA1C in the last 72 hours. Urine analysis:    Component Value Date/Time   COLORURINE YELLOW  03/20/2011 2205   APPEARANCEUR CLEAR 03/20/2011 2205   LABSPEC <1.005 (L) 03/20/2011 2205   PHURINE 7.0 03/20/2011 2205   GLUCOSEU NEGATIVE 03/20/2011 2205   HGBUR NEGATIVE 03/20/2011 2205   BILIRUBINUR NEGATIVE 03/20/2011 2205   KETONESUR NEGATIVE 03/20/2011 2205   PROTEINUR 30 (A) 03/20/2011 2205   UROBILINOGEN 0.2 03/20/2011 2205   NITRITE NEGATIVE 03/20/2011 2205   LEUKOCYTESUR NEGATIVE 03/20/2011 2205   Sepsis Labs: @LABRCNTIP (procalcitonin:4,lacticidven:4) ) Recent Results (from the past 240 hour(s))  MRSA PCR Screening     Status: Abnormal   Collection Time: 03/03/16  2:32 AM  Result Value Ref Range Status   MRSA by PCR POSITIVE (A) NEGATIVE Final    Comment:        The GeneXpert MRSA Assay (FDA approved for NASAL specimens only), is one component of a comprehensive MRSA colonization surveillance program. It is not intended to diagnose MRSA infection nor to guide or monitor treatment for MRSA infections. RESULT CALLED TO, READ BACK BY AND VERIFIED WITH: J ARMSTRONG,RN @0642  03/03/16 MKELLY,MLT      Scheduled Meds: . amLODipine  5 mg Oral Daily  . Chlorhexidine Gluconate Cloth  6 each Topical Q0600  . colchicine  0.6 mg Oral Daily  . escitalopram  10 mg Oral Daily  . fenofibrate  160 mg Oral Daily  . metoprolol succinate  25 mg Oral Daily  . rosuvastatin  10 mg Oral Daily   Continuous Infusions:  Procedures/Studies: Dg Pelvis 1-2 Views  Result Date: 03/02/2016 CLINICAL DATA:  [name] in her kitchen today after losing balance. EXAM: PELVIS - 1-2 VIEW COMPARISON:  None. FINDINGS: There is a comminuted intertrochanteric left hip fracture with varus angulation. No dislocation. No radiographic findings to suggest a pathologic basis for the fracture. Bony pelvis appears intact. IMPRESSION: Intertrochanteric left hip fracture. Electronically Signed   By: Andreas Newport M.D.   On: 03/02/2016 21:47   Dg Femur Min 2 Views Left  Result Date: 03/02/2016 CLINICAL  DATA:  Proximal femur pain and generalized left hip pain after a fall today EXAM: LEFT FEMUR 2 VIEWS COMPARISON:  CT 03/20/2015 FINDINGS: Pubic symphysis appears intact. Comminuted inter trochanteric fracture of the proximal left femur with displaced lesser trochanteric fracture fragment toward the midline. Superior migration/displacement of the distal femoral fragment/shaft. The left femoral head projects in joint. There are vascular calcifications. Degenerative changes of the left knee. IMPRESSION: Comminuted inter trochanteric fracture of the proximal left femur. Electronically Signed   By: Donavan Foil M.D.   On: 03/02/2016 21:50    Myonna Chisom, DO  [facility] Pager [phone]  If 7PM-7AM, please contact night-coverage [email or web address] Password [email or web address] 03/03/2016, 7:59 AM   LOS: 1 day

## 2016-03-03 NOTE — Interval H&P Note (Signed)
History and Physical Interval Note:  03/03/2016 2:31 PM  [name]  has presented today for surgery, with the diagnosis of Fractured Left Hip  The various methods of treatment have been discussed with the patient and family. After consideration of risks, benefits and other options for treatment, the patient has consented to  Procedure(s): INTRAMEDULLARY (IM) NAIL FEMORAL (Left) as a surgical intervention .  The patient's history has been reviewed, patient examined, no change in status, stable for surgery.  I have reviewed the patient's chart and labs.  Questions were answered to the patient's satisfaction.     Kaitlin Ardito D

## 2016-03-03 NOTE — Progress Notes (Signed)
Initial Nutrition Assessment  DOCUMENTATION CODES:   Obesity unspecified  INTERVENTION:  Once diet advances, provide Ensure Enlive po once daily, each supplement provides 350 kcal and 20 grams of protein.  Recommend obtaining new weight once diet advances.  NUTRITION DIAGNOSIS:   Increased nutrient needs related to chronic illness as evidenced by estimated needs.  GOAL:   Patient will meet greater than or equal to 90% of their needs  MONITOR:   Diet advancement, Supplement acceptance, Labs, Weight trends, Skin, I & O's  REASON FOR ASSESSMENT:   Consult Hip fracture protocol  ASSESSMENT:   82 y/o female with history of HTN, Breast cancer, AAA, HLD presented with a mechanical fall. X-rays revealed a comminuted left hip intertrochanteric fracture.  Pt is currently NPO for surgery today. Pt reports more thirst than hunger during time of visit. Pt reports eating well at home with usual consumption of at least 2-3 meals a day with no other difficulties. Noted no recent weight recorded, however pt reports usual body weight of ~199 lbs. RD to order nutritional supplements to aid in post op healing. Nursing staff to provide once diet advances.  Pt with no observed significant fat or muscle mass loss.   Labs and medications reviewed.  Diet Order:  Diet NPO time specified  Skin:  Reviewed, no issues  Last BM:  12/16  Height:   Ht Readings from Last 1 Encounters:  01/15/12 5\' 5"  (1.651 m)    Weight:   Wt Readings from Last 1 Encounters:  01/15/12 192 lb 9.6 oz (87.4 kg)    Ideal Body Weight:  56.8 kg  BMI:  There is no height or weight on file to calculate BMI.  Estimated Nutritional Needs:   Kcal:  1700-1900  Protein:  80-90 grams  Fluid:  1.7 - 1.9 L/day  EDUCATION NEEDS:   No education needs identified at this time  Corrin Parker, MS, RD, LDN Pager # [phone] After hours/ weekend pager # [phone]

## 2016-03-03 NOTE — Anesthesia Procedure Notes (Addendum)
Spinal  Patient location during procedure: OR Start time: 03/03/2016 4:38 PM End time: 03/03/2016 4:45 PM Staffing Anesthesiologist: Oleta Mouse Preanesthetic Checklist Completed: patient identified, surgical consent, pre-op evaluation, timeout performed, IV checked, risks and benefits discussed and monitors and equipment checked Spinal Block Patient position: left lateral decubitus Prep: site prepped and draped and DuraPrep Patient monitoring: heart rate, cardiac monitor, continuous pulse ox and blood pressure Approach: midline Location: L3-4 Injection technique: single-shot Needle Needle type: Pencan  Needle gauge: 24 G Needle length: 10 cm Assessment Sensory level: T8

## 2016-03-04 ENCOUNTER — Encounter (HOSPITAL_COMMUNITY): Payer: Self-pay | Admitting: Orthopedic Surgery

## 2016-03-04 LAB — BASIC METABOLIC PANEL
Anion gap: 8 (ref 5–15)
BUN: 27 mg/dL — AB (ref 6–20)
CHLORIDE: 108 mmol/L (ref 101–111)
CO2: 23 mmol/L (ref 22–32)
Calcium: 9 mg/dL (ref 8.9–10.3)
Creatinine, Ser: 1.31 mg/dL — ABNORMAL HIGH (ref 0.44–1.00)
GFR calc Af Amer: 41 mL/min — ABNORMAL LOW (ref >60)
GFR calc non Af Amer: 35 mL/min — ABNORMAL LOW (ref >60)
GLUCOSE: 118 mg/dL — AB (ref 65–99)
Potassium: 4.1 mmol/L (ref 3.5–5.1)
Sodium: 139 mmol/L (ref 135–145)

## 2016-03-04 LAB — CBC
HEMATOCRIT: 23.8 % — AB (ref 36.0–46.0)
Hemoglobin: 7.7 g/dL — ABNORMAL LOW (ref 12.0–15.0)
MCH: 28.9 pg (ref 26.0–34.0)
MCHC: 32.4 g/dL (ref 30.0–36.0)
MCV: 89.5 fL (ref 78.0–100.0)
Platelets: 98 10*3/uL — ABNORMAL LOW (ref 150–400)
RBC: 2.66 MIL/uL — ABNORMAL LOW (ref 3.87–5.11)
RDW: 13.9 % (ref 11.5–15.5)
WBC: 4.8 10*3/uL (ref 4.0–10.5)

## 2016-03-04 LAB — VITAMIN D 25 HYDROXY (VIT D DEFICIENCY, FRACTURES): Vit D, 25-Hydroxy: 15.5 ng/mL — ABNORMAL LOW (ref 30.0–100.0)

## 2016-03-04 LAB — PREPARE RBC (CROSSMATCH)

## 2016-03-04 LAB — HEMOGLOBIN AND HEMATOCRIT, BLOOD
HCT: 27.3 % — ABNORMAL LOW (ref 36.0–46.0)
HEMOGLOBIN: 9.2 g/dL — AB (ref 12.0–15.0)

## 2016-03-04 MED ORDER — SODIUM CHLORIDE 0.9 % IV BOLUS (SEPSIS)
500.0000 mL | Freq: Once | INTRAVENOUS | Status: AC
  Administered 2016-03-04: 500 mL via INTRAVENOUS

## 2016-03-04 MED ORDER — SODIUM CHLORIDE 0.9 % IV BOLUS (SEPSIS): 500.0000 mL | Freq: Once | INTRAVENOUS | Status: DC

## 2016-03-04 MED ORDER — MUPIROCIN 2 % EX OINT
1.0000 "application " | TOPICAL_OINTMENT | Freq: Two times a day (BID) | CUTANEOUS | Status: DC
  Administered 2016-03-04 – 2016-03-06 (×5): 1 via NASAL
  Filled 2016-03-04: qty 22

## 2016-03-04 MED ORDER — ACETAMINOPHEN 325 MG PO TABS
650.0000 mg | ORAL_TABLET | Freq: Once | ORAL | Status: DC
  Filled 2016-03-04: qty 2

## 2016-03-04 MED ORDER — SODIUM CHLORIDE 0.9 % IV SOLN
INTRAVENOUS | Status: DC
  Administered 2016-03-05 – 2016-03-06 (×2): via INTRAVENOUS

## 2016-03-04 MED ORDER — SODIUM CHLORIDE 0.9 % IV SOLN
Freq: Once | INTRAVENOUS | Status: AC
  Administered 2016-03-04: 12:00:00 via INTRAVENOUS

## 2016-03-04 MED ORDER — OXYCODONE-ACETAMINOPHEN 5-325 MG PO TABS
2.0000 | ORAL_TABLET | Freq: Once | ORAL | Status: AC
  Administered 2016-03-04: 2 via ORAL
  Filled 2016-03-04: qty 2

## 2016-03-04 MED ORDER — MORPHINE SULFATE (PF) 2 MG/ML IV SOLN
2.0000 mg | INTRAVENOUS | Status: DC | PRN
  Administered 2016-03-05 – 2016-03-06 (×2): 2 mg via INTRAVENOUS
  Filled 2016-03-04 (×2): qty 1

## 2016-03-04 MED ORDER — KETOROLAC TROMETHAMINE 15 MG/ML IJ SOLN
15.0000 mg | Freq: Once | INTRAMUSCULAR | Status: AC
  Administered 2016-03-04: 15 mg via INTRAVENOUS
  Filled 2016-03-04: qty 1

## 2016-03-04 MED ORDER — MORPHINE SULFATE (PF) 4 MG/ML IV SOLN
4.0000 mg | Freq: Once | INTRAVENOUS | Status: AC
  Administered 2016-03-04: 4 mg via INTRAVENOUS
  Filled 2016-03-04: qty 1

## 2016-03-04 MED ORDER — FUROSEMIDE 10 MG/ML IJ SOLN
20.0000 mg | Freq: Once | INTRAMUSCULAR | Status: AC
  Administered 2016-03-04: 20 mg via INTRAVENOUS
  Filled 2016-03-04: qty 2

## 2016-03-04 MED ORDER — HYDROCODONE-ACETAMINOPHEN 5-325 MG PO TABS
1.0000 | ORAL_TABLET | ORAL | Status: DC | PRN
  Administered 2016-03-04 – 2016-03-06 (×6): 2 via ORAL
  Filled 2016-03-04 (×6): qty 2

## 2016-03-04 MED ORDER — CHLORHEXIDINE GLUCONATE CLOTH 2 % EX PADS
6.0000 | MEDICATED_PAD | Freq: Every day | CUTANEOUS | Status: DC
  Administered 2016-03-04 – 2016-03-06 (×3): 6 via TOPICAL

## 2016-03-04 MED ORDER — DIPHENHYDRAMINE HCL 25 MG PO CAPS
25.0000 mg | ORAL_CAPSULE | Freq: Once | ORAL | Status: AC
  Administered 2016-03-04: 25 mg via ORAL
  Filled 2016-03-04: qty 1

## 2016-03-04 NOTE — Evaluation (Signed)
Physical Therapy Evaluation Patient Details Name: [name] MRN: [id number] DOB: [birth year] Today's Date: 03/04/2016   History of Present Illness  [name] is a 82 y.o. female with a past medical history significant for HTN, multiple ortopedic and hernia surgeries, and BrCa in remission who presents with hip pain after a fall. Now s/p IM Nail for L hip fx, WBAT  Clinical Impression   Patient is s/p above surgery resulting in functional limitations due to the deficits listed below (see PT Problem List). Pain and anxiety limiting activity tolerance this session; Hopeful that as pain is better controlled, Ms. Ahart will be able to tolerate more;  She has been managing independently at home prior to this admission -- this makes CIR worth considering (have placed CIR screen); still, if slow progress, we must consider SNF for post-acute rehab; Patient will benefit from skilled PT to increase their independence and safety with mobility to allow discharge to the venue listed below.       Follow Up Recommendations SNF    Equipment Recommendations  3in1 (PT);Wheelchair (measurements PT);Wheelchair cushion (measurements PT)    Recommendations for Other Services OT consult     Precautions / Restrictions Precautions Precautions: Fall Precaution Comments: Premedicate for pain Restrictions LLE Weight Bearing: Weight bearing as tolerated      Mobility  Bed Mobility Overal bed mobility: Needs Assistance Bed Mobility: Supine to Sit     Supine to sit: Total assist;+2 for physical assistance     General bed mobility comments: Requiring +2 tot assist for all bed mobility; supported LLE coming off of bed and heavy assist posteriorly to support trunk coming to sit  Transfers Overall transfer level: Needs assistance Equipment used: 2 person hand held assist Transfers: Stand Pivot Transfers   Stand pivot transfers: +2 physical assistance;Max assist       General  transfer comment: Performed basic standi pivot transfer to  non-operative R side with +2 Max assist; bilateral support given at gait belt and elbow/shoulder girdle  Ambulation/Gait                Stairs            Wheelchair Mobility    Modified Rankin (Stroke Patients Only)       Balance Overall balance assessment: Needs assistance Sitting-balance support: Bilateral upper extremity supported;Feet supported Sitting balance-Leahy Scale: Poor       Standing balance-Leahy Scale: Zero                               Pertinent Vitals/Pain Pain Assessment: Faces Faces Pain Scale: Hurts whole lot Pain Location: L hip; yells in pain with motion Pain Descriptors / Indicators: Aching;Grimacing;Guarding;Crying Pain Intervention(s): Limited activity within patient's tolerance    Home Living Family/patient expects to be discharged to:: Private residence Living Arrangements: Children Available Help at Discharge: Family (daughter and son-in-law work during the day)                  Prior Function Level of Independence: Independent with assistive device(s)         Comments: Has been managing during the day at home while daughter and son-in-law at work; uses RW regularly     Journalist, newspaper        Extremity/Trunk Assessment   Upper Extremity Assessment Upper Extremity Assessment: Generalized weakness    Lower Extremity Assessment Lower Extremity Assessment: LLE deficits/detail LLE Deficits / Details: Grossly  decr aROM and muscle activation about hip joint, limited by pain postop LLE: Unable to fully assess due to pain       Communication   Communication: No difficulties  Cognition Arousal/Alertness: Awake/alert Behavior During Therapy: WFL for tasks assessed/performed;Anxious Overall Cognitive Status: Within Functional Limits for tasks assessed                      General Comments      Exercises     Assessment/Plan    PT  Assessment Patient needs continued PT services  PT Problem List Decreased strength;Decreased range of motion;Decreased activity tolerance;Decreased balance;Decreased mobility;Decreased coordination;Decreased knowledge of use of DME;Decreased knowledge of precautions;Pain          PT Treatment Interventions DME instruction;Gait training;Functional mobility training;Therapeutic activities;Therapeutic exercise;Patient/family education;Balance training    PT Goals (Current goals can be found in the Care Plan section)  Acute Rehab PT Goals Patient Stated Goal: does not want to hurt PT Goal Formulation: With patient Time For Goal Achievement: 03/18/16 Potential to Achieve Goals: Good    Frequency Min 4X/week   Barriers to discharge        Co-evaluation               End of Session Equipment Utilized During Treatment: Gait belt Activity Tolerance: Patient limited by pain Patient left: in chair;with call bell/phone within reach;with nursing/sitter in room;with chair alarm set Nurse Communication: Mobility status         Time: 1133-1150 PT Time Calculation (min) (ACUTE ONLY): 17 min   Charges:   PT Evaluation $PT Eval Moderate Complexity: 1 Procedure     PT G CodesColletta Maryland 03/04/2016, 2:41 PM  Roney Marion, [place] Pager [phone] Office [phone]

## 2016-03-04 NOTE — Progress Notes (Signed)
Rehab Admissions Coordinator Note:  Patient was screened by Retta Diones for appropriateness for an Inpatient Acute Rehab Consult.  At this time, we are recommending Inpatient Rehab consult.  Retta Diones 03/04/2016, 4:13 PM  I can be reached at [phone].

## 2016-03-04 NOTE — Progress Notes (Signed)
PROGRESS NOTE  [name] [id number] DOB: 03-01-1927 DOA: 03/02/2016 PCP: Sherrie Mustache, MD  Brief History:   82 y/o HTN,  Breast cancer,  AAA, HLD  presented with a mechanical fall.  slipped and lost balance and fell after unloading some onions in her pantry.   no loss of consciousness,  any chest discomfort, shortness of breath, dizziness.  had immediate left hip pain and was unable to get up. She laid on her floor for about 3 hours until her daughter found her.  X-rays revealed a comminuted left hip intertrochanteric fracture.  Orthopedics was consulted and the patient was admitted for definitive care. Prior to her fall, the patient is able to ambulate with the assistance of a walker.  Assessment/Plan: Left comminuted intertrochanteric fracture -appreciate ortho consult -had surgery 03/03/16 1. per discretion Ortho services-appreciate their input into   Anticoagulation post-op  Weight bearing tolerance for therapy services  Wound care  Pain management  Follow-up services required  Anemia of acute blood loss, expected post-op Hemoglobin 12--->7.7 transfuse 1 U PRBC 12.20 Some component of hypovolemia [patient NOT in shock]  HTN -holding telmisartan and HCTZ -continue metoprolol succinate  -BP  Still low, will d/c amlodipine  Hyperlipidemia -Continue statin  Dehydration -Mild elevation in creatinine -Improved with IV fluid   Hyperglycemia -Hemoglobin A1c    Disposition Plan:   Home vs SNF in 1-2 days  Family Communication:   D/w with 3 of 5 children at bedside  Consultants: Ortho-[place]   Code Status:  FULL  DVT Prophylaxis:  To be addressed by ortho post-op   Procedures: As Listed in Progress Note Above  Antibiotics: None    Subjective:  Alert pleasant in nad Some pain with leg movement No fever no chills no cp No n No vomit  Objective: Vitals:   03/03/16 2030 03/03/16 2046 03/03/16 2357 03/04/16  0438  BP: 105/63  104/61 (!) 109/59  Pulse: 73  69 65  Resp: 16  16 16   Temp: 98.6 F (37 C)  97.7 F (36.5 C) 99.2 F (37.3 C)  TempSrc: Oral  Oral Oral  SpO2: 99%  98% 100%  Weight:  90.3 kg (199 lb)    Height:  5\' 6"  (1.676 m)      Intake/Output Summary (Last 24 hours) at 03/04/16 0756 Last data filed at 03/04/16 [id number]  Gross per 24 hour  Intake             1220 ml  Output             1200 ml  Net               20 ml   Weight change:  Exam:   General:  Pt is alert, follows commands appropriately, not in acute distress  HEENT: No icterus, No thrush, No neck mass, [place]/AT  Cardiovascular: RRR, S1/S2, no rubs, no gallops  Respiratory: CTA bilaterally, no wheezing, no crackles, no rhonchi  Abdomen: Soft/+BS, non tender, non distended, no guarding  Extremities: trace LE edema, No lymphangitis, No petechiae, No rashes, no synovitis   Data Reviewed: I have personally reviewed following labs and imaging studies Basic Metabolic Panel:  Recent Labs Lab 03/02/16 2100 03/03/16 0507 03/04/16 0539  NA 138 140 139  K 4.0 4.0 4.1  CL 106 108 108  CO2 26 24 23   GLUCOSE 154* 117* 118*  BUN 28* 29* 27*  CREATININE 1.22* 1.02* 1.31*  CALCIUM 10.1  9.4 9.0   Liver Function Tests: No results for input(s): AST, ALT, ALKPHOS, BILITOT, PROT, ALBUMIN in the last 168 hours. No results for input(s): LIPASE, AMYLASE in the last 168 hours. No results for input(s): AMMONIA in the last 168 hours. Coagulation Profile:  Recent Labs Lab 03/03/16 0100  INR 1.10   CBC:  Recent Labs Lab 03/02/16 2100 03/03/16 0507 03/04/16 0539  WBC 8.4 5.4 4.8  NEUTROABS 7.4  --   --   HGB 11.7* 10.5* 7.7*  HCT 35.9* 31.8* 23.8*  MCV 90.2 88.3 89.5  PLT 145* 146* 98*   Cardiac Enzymes: No results for input(s): CKTOTAL, CKMB, CKMBINDEX, TROPONINI in the last 168 hours. BNP: Invalid input(s): POCBNP CBG: No results for input(s): GLUCAP in the last 168 hours. HbA1C: No results for  input(s): HGBA1C in the last 72 hours. Urine analysis:    Component Value Date/Time   COLORURINE YELLOW 03/20/2011 2205   APPEARANCEUR CLEAR 03/20/2011 2205   LABSPEC <1.005 (L) 03/20/2011 2205   PHURINE 7.0 03/20/2011 2205   GLUCOSEU NEGATIVE 03/20/2011 2205   HGBUR NEGATIVE 03/20/2011 2205   BILIRUBINUR NEGATIVE 03/20/2011 2205   KETONESUR NEGATIVE 03/20/2011 2205   PROTEINUR 30 (A) 03/20/2011 2205   UROBILINOGEN 0.2 03/20/2011 2205   NITRITE NEGATIVE 03/20/2011 2205   LEUKOCYTESUR NEGATIVE 03/20/2011 2205   Sepsis Labs: @LABRCNTIP (procalcitonin:4,lacticidven:4) ) Recent Results (from the past 240 hour(s))  MRSA PCR Screening     Status: Abnormal   Collection Time: 03/03/16  2:32 AM  Result Value Ref Range Status   MRSA by PCR POSITIVE (A) NEGATIVE Final    Comment:        The GeneXpert MRSA Assay (FDA approved for NASAL specimens only), is one component of a comprehensive MRSA colonization surveillance program. It is not intended to diagnose MRSA infection nor to guide or monitor treatment for MRSA infections. RESULT CALLED TO, READ BACK BY AND VERIFIED WITH: J ARMSTRONG,RN @0642  03/03/16 MKELLY,MLT      Scheduled Meds: . amLODipine  5 mg Oral Daily  .  ceFAZolin (ANCEF) IV  1 g Intravenous Q6H  . Chlorhexidine Gluconate Cloth  6 each Topical Q0600  . colchicine  0.6 mg Oral Daily  . enoxaparin (LOVENOX) injection  30 mg Subcutaneous Q24H  . escitalopram  10 mg Oral Daily  . feeding supplement (ENSURE ENLIVE)  237 mL Oral Q1500  . fenofibrate  160 mg Oral Daily  . metoprolol succinate  25 mg Oral Daily  . rosuvastatin  10 mg Oral Daily   Continuous Infusions:  Procedures/Studies: Dg Pelvis 1-2 Views  Result Date: 03/02/2016 CLINICAL DATA:  [name] in her kitchen today after losing balance. EXAM: PELVIS - 1-2 VIEW COMPARISON:  None. FINDINGS: There is a comminuted intertrochanteric left hip fracture with varus angulation. No dislocation. No radiographic  findings to suggest a pathologic basis for the fracture. Bony pelvis appears intact. IMPRESSION: Intertrochanteric left hip fracture. Electronically Signed   By: Andreas Newport M.D.   On: 03/02/2016 21:47   Dg C-arm 1-60 Min  Result Date: 03/03/2016 CLINICAL DATA:  82 year old female undergoing left femur ORIF. Initial encounter. EXAM: LEFT FEMUR 2 VIEWS; DG C-ARM 61-120 MIN COMPARISON:  03/02/2016 left femur series. FLUOROSCOPY TIME:  0 minutes 59 seconds FINDINGS: 3 intraoperative fluoroscopic views of the left femur. Left femur intramedullary rod has been placed with the proximal interlocking dynamic hip screw. These traverse the severely comminuted intertrochanteric fracture. Visible hardware appears intact. IMPRESSION: Left femur ORIF with no adverse hardware features.  Electronically Signed   By: Genevie Ann M.D.   On: 03/03/2016 19:21   Dg Femur Min 2 Views Left  Result Date: 03/03/2016 CLINICAL DATA:  82 year old female undergoing left femur ORIF. Initial encounter. EXAM: LEFT FEMUR 2 VIEWS; DG C-ARM 61-120 MIN COMPARISON:  03/02/2016 left femur series. FLUOROSCOPY TIME:  0 minutes 59 seconds FINDINGS: 3 intraoperative fluoroscopic views of the left femur. Left femur intramedullary rod has been placed with the proximal interlocking dynamic hip screw. These traverse the severely comminuted intertrochanteric fracture. Visible hardware appears intact. IMPRESSION: Left femur ORIF with no adverse hardware features. Electronically Signed   By: Genevie Ann M.D.   On: 03/03/2016 19:21   Dg Femur Min 2 Views Left  Result Date: 03/02/2016 CLINICAL DATA:  Proximal femur pain and generalized left hip pain after a fall today EXAM: LEFT FEMUR 2 VIEWS COMPARISON:  CT 03/20/2015 FINDINGS: Pubic symphysis appears intact. Comminuted inter trochanteric fracture of the proximal left femur with displaced lesser trochanteric fracture fragment toward the midline. Superior migration/displacement of the distal femoral  fragment/shaft. The left femoral head projects in joint. There are vascular calcifications. Degenerative changes of the left knee. IMPRESSION: Comminuted inter trochanteric fracture of the proximal left femur. Electronically Signed   By: Donavan Foil M.D.   On: 03/02/2016 21:50    Nita Sells, DO  [facility] Pager [phone]  If 7PM-7AM, please contact night-coverage [email or web address] Password [email or web address] 03/04/2016, 7:56 AM   LOS: 2 days

## 2016-03-04 NOTE — Progress Notes (Signed)
   Assessment: 1 Day Post-Op  S/P Procedure(s) (LRB): INTRAMEDULLARY (IM) NAIL FEMORAL (Left) by Dr. Ernesta Amble. [name] on 03/03/16  Active Problems:   Essential hypertension, benign   Closed left hip fracture, initial encounter ([place])   Anemia   Dehydration   Intertrochanteric fracture of left hip (HCC) Acute Blood loss anemia. H/H 7.7/23.8   Plan: Up with therapy Consider transfusion for abla.  Currently denying weakness/dizziness. Not OOB yet  Weight Bearing: Weight Bearing as Tolerated (WBAT) left leg Dressings: PRN.  VTE prophylaxis: Lovenox, SCDs, ambulation Dispo: Pending PT/OT evaluation.  Previously ambulatory with a walker. Lives with her daughter and daughter's husband.  Subjective: Patient reports pain as moderate. Pain controlled with PO meds.  Tolerating diet.  Urinating. No weakness, dizziness, CP, SOB. Not yet OOB.  Objective:   VITALS:   Vitals:   03/03/16 2030 03/03/16 2046 03/03/16 2357 03/04/16 0438  BP: 105/63  104/61 (!) 109/59  Pulse: 73  69 65  Resp: 16  16 16   Temp: 98.6 F (37 C)  97.7 F (36.5 C) 99.2 F (37.3 C)  TempSrc: Oral  Oral Oral  SpO2: 99%  98% 100%  Weight:  90.3 kg (199 lb)    Height:  5\' 6"  (1.676 m)     CBC Latest Ref Rng & Units 03/04/2016 03/03/2016 03/02/2016  WBC 4.0 - 10.5 K/uL 4.8 5.4 8.4  Hemoglobin 12.0 - 15.0 g/dL 7.7(L) 10.5(L) 11.7(L)  Hematocrit 36.0 - 46.0 % 23.8(L) 31.8(L) 35.9(L)  Platelets 150 - 400 K/uL 98(L) 146(L) 145(L)   BMP Latest Ref Rng & Units 03/04/2016 03/03/2016 03/02/2016  Glucose 65 - 99 mg/dL 118(H) 117(H) 154(H)  BUN 6 - 20 mg/dL 27(H) 29(H) 28(H)  Creatinine 0.44 - 1.00 mg/dL 1.31(H) 1.02(H) 1.22(H)  Sodium 135 - 145 mmol/L 139 140 138  Potassium 3.5 - 5.1 mmol/L 4.1 4.0 4.0  Chloride 101 - 111 mmol/L 108 108 106  CO2 22 - 32 mmol/L 23 24 26   Calcium 8.9 - 10.3 mg/dL 9.0 9.4 10.1   Intake/Output      12/19 0701 - 12/20 0700 12/20 0701 - 12/21 0700   P.O. 120    I.V. (mL/kg) 1000  (11.1)    IV Piggyback 100    Total Intake(mL/kg) 1220 (13.5)    Urine (mL/kg/hr) 1000 (0.5)    Blood 200 (0.1)    Total Output 1200     Net +20           Physical Exam: General: NAD.  Upright in bed eating breakfast Resp: No increased wob Cardio: regular rate and rhythm ABD soft, protuberant Neurologically intact MSK Neurovascularly intact Sensation intact distally Feet warm Dorsiflexion/Plantar flexion intact Incision: dressing C/D/I  Prudencio Burly III 03/04/2016, 8:13 AM

## 2016-03-05 DIAGNOSIS — M24551 Contracture, right hip: Secondary | ICD-10-CM

## 2016-03-05 LAB — HEMOGLOBIN A1C
HEMOGLOBIN A1C: 5.3 % (ref 4.8–5.6)
MEAN PLASMA GLUCOSE: 105 mg/dL

## 2016-03-05 MED ORDER — SENNOSIDES-DOCUSATE SODIUM 8.6-50 MG PO TABS: 1.0000 | ORAL_TABLET | Freq: Every day | ORAL | Status: DC

## 2016-03-05 MED ORDER — POLYETHYLENE GLYCOL 3350 17 G PO PACK: 17.0000 g | PACK | Freq: Every day | ORAL | 0 refills | Status: DC | PRN

## 2016-03-05 MED ORDER — METOPROLOL SUCCINATE ER 25 MG PO TB24
12.5000 mg | ORAL_TABLET | Freq: Every day | ORAL | Status: DC
  Administered 2016-03-06: 12.5 mg via ORAL
  Filled 2016-03-05: qty 1

## 2016-03-05 MED ORDER — HYDROCODONE-ACETAMINOPHEN 5-325 MG PO TABS: 1.0000 | ORAL_TABLET | Freq: Four times a day (QID) | ORAL | 0 refills | Status: DC | PRN

## 2016-03-05 NOTE — Clinical Social Work Note (Signed)
CSW spoke with pt at bedside. Pt is agreeable to CIR and SNF. Pt reports she will go to whichever her adult children want her to go to. CSW spoke with [name] (pt's daughter) and [name] reports her and her siblings have discussed placement and they want their mom to go to CIR. Per MD note CIR admissions coordinator will follow up with pt when pt is medically cleared.  Clinical Social Worker will sign off for now as social work intervention is no longer needed. Please consult [place] again if new need arises.  [address], [place]

## 2016-03-05 NOTE — Anesthesia Postprocedure Evaluation (Addendum)
Anesthesia Post Note  Patient: [name]  Procedure(s) Performed: Procedure(s) (LRB): INTRAMEDULLARY (IM) NAIL FEMORAL (Left)  Patient location during evaluation: PACU Anesthesia Type: Spinal Level of consciousness: awake and alert Pain management: pain level controlled Vital Signs Assessment: post-procedure vital signs reviewed and stable Respiratory status: spontaneous breathing, nonlabored ventilation, respiratory function stable and patient connected to nasal cannula oxygen Cardiovascular status: blood pressure returned to baseline and stable Postop Assessment: no signs of nausea or vomiting and spinal receding Anesthetic complications: no       Last Vitals:  Vitals:   03/05/16 0900 03/05/16 1500  BP: (!) 96/48 (!) 108/54  Pulse: 60 65  Resp:  16  Temp:  36.4 C    Last Pain:  Vitals:   03/05/16 1500  TempSrc: Oral  PainSc:                  Quadasia Newsham

## 2016-03-05 NOTE — Progress Notes (Signed)
Patient seen examined and clinically stabilizing.  in pain and poorly mobile today We will monitor her pain levels overnight and if this is reasonably controlled, will tentatively plan for d/c am 12/22 if stable My partner will update d/c summary tomorrow prior to her d/c to CIR   Verneita Griffes, MD [facility] [phone]

## 2016-03-05 NOTE — Evaluation (Signed)
Occupational Therapy Evaluation Patient Details Name: [name] MRN: [id number] DOB: [birth year] Today's Date: 03/05/2016    History of Present Illness [name] is a 82 y.o. female with a past medical history significant for HTN, multiple ortopedic and hernia surgeries, and BrCa in remission who presents with hip pain after a fall. Now s/p IM Nail for L hip fx, WBAT   Clinical Impression   PTA, pt required assistance for bathing and was using a RW and cane for functional mobility. Pt has a hystory of multiple past surgeries as detailed above. Pt requires +2 physical assistance for all functional mobility and able to complete stand-pivot transfer with max assist +2. She additionally requires max assist for LB ADL at bed level. Pt hesitant to move and limited by pain this session. Pt will have assistance from family in the evenings and intermittently during the day. She lives with her daughter and son-in-law who will be at work throughout the day. Pt would benefit from continued OT services while admitted to improve independence with ADL and functional mobility. Recommend short-term SNF placement for continued rehabilitation services in order to maximize independence with ADL and return to PLOF. OT will continue to follow acutely.    Follow Up Recommendations  SNF;Supervision/Assistance - 24 hour    Equipment Recommendations  Other (comment) (TBD at next venue of care)    Recommendations for Other Services       Precautions / Restrictions Precautions Precautions: Fall Restrictions Weight Bearing Restrictions: Yes LLE Weight Bearing: Weight bearing as tolerated      Mobility Bed Mobility Overal bed mobility: Needs Assistance Bed Mobility: Supine to Sit     Supine to sit: +2 for physical assistance;Max assist;HOB elevated     General bed mobility comments: assist at [place] and trunk to come into sitting with HOB elevated and use of bed pad; cues for  sequencing and technique  Transfers Overall transfer level: Needs assistance Equipment used: Rolling walker (2 wheeled);2 person hand held assist Transfers: Sit to/from [facility] Sit to Stand: Max assist;+2 physical assistance Stand pivot transfers: +2 physical assistance;Max assist       General transfer comment: sit to stand with RW and +2 to power up into standing and weight shift; mod A to maintain balance in standing however pt has tendency to attempt sitting; assist for stand pivot with +2 face to face with use of gait belt and assist to pivot bilat feet    Balance Overall balance assessment: Needs assistance Sitting-balance support: Bilateral upper extremity supported;Feet supported Sitting balance-Leahy Scale: Fair Sitting balance - Comments: Prefers BUE support but able to sit briefly without it for ADL.   Standing balance support: Bilateral upper extremity supported;During functional activity Standing balance-Leahy Scale: Poor                              ADL Overall ADL's : Needs assistance/impaired Eating/Feeding: Set up;Sitting   Grooming: Set up;Sitting   Upper Body Bathing: Minimal assistance;Sitting   Lower Body Bathing: Maximal assistance;Bed level   Upper Body Dressing : Minimal assistance;Sitting   Lower Body Dressing: Maximal assistance;Bed level   Toilet Transfer: Maximal assistance;Total assistance;+2 for physical assistance;Stand-pivot   Toileting- Clothing Manipulation and Hygiene: Maximal assistance;Total assistance;+2 for physical assistance;Sit to/from stand         General ADL Comments: Pt hesitant for movement. Able to sit at EOB for ADL tasks with min guard for  safety. Educated on benefits of participating in own self-care.     Vision Vision Assessment?: No apparent visual deficits   Perception     Praxis      Pertinent Vitals/Pain Pain Assessment: Faces Faces Pain Scale: Hurts whole lot Pain  Location: L hip; yells in pain with motion Pain Descriptors / Indicators: Aching;Grimacing;Guarding;Moaning Pain Intervention(s): Limited activity within patient's tolerance;Monitored during session;Repositioned;Ice applied;Patient requesting pain meds-RN notified     Hand Dominance Right   Extremity/Trunk Assessment Upper Extremity Assessment Upper Extremity Assessment: Generalized weakness   Lower Extremity Assessment Lower Extremity Assessment: Defer to PT evaluation       Communication Communication Communication: No difficulties   Cognition Arousal/Alertness: Awake/alert Behavior During Therapy: WFL for tasks assessed/performed;Anxious Overall Cognitive Status: No family/caregiver present to determine baseline cognitive functioning Area of Impairment: Memory     Memory: Decreased short-term memory         General Comments: Pt frequently re-telling stories and confusing timeline of stay in hospital.   General Comments       Exercises       Shoulder Instructions      Home Living Family/patient expects to be discharged to:: Private residence Living Arrangements: Children Available Help at Discharge: Family;Available PRN/intermittently (daughter and son-in-law work during the day) Type of Home: House             Bathroom Shower/Tub: Tub/shower unit Shower/tub characteristics: Curtain Biochemist, clinical: [place]: Environmental consultant - 2 wheels;Cane - quad;Wheelchair - manual          Prior Functioning/Environment Level of Independence: Needs assistance  Gait / Transfers Assistance Needed: Using RW and cane when needed ADL's / Homemaking Assistance Needed: Able to dress self, required assistance for bathing.   Comments: Has been managing during the day at home while daughter and son-in-law at work; uses RW regularly        OT Problem List: Decreased strength;Decreased range of motion;Decreased activity tolerance;Impaired balance (sitting  and/or standing);Decreased safety awareness;Decreased knowledge of use of DME or AE;Decreased knowledge of precautions;Pain   OT Treatment/Interventions: Self-care/ADL training;Therapeutic exercise;DME and/or AE instruction;Energy conservation;Balance training;Patient/family education;Therapeutic activities    OT Goals(Current goals can be found in the care plan section) Acute Rehab OT Goals Patient Stated Goal: less pain  OT Goal Formulation: With patient Time For Goal Achievement: 03/12/16 Potential to Achieve Goals: Fair ADL Goals Pt Will Perform Lower Body Bathing: with supervision;sit to/from stand Pt Will Perform Lower Body Dressing: with supervision;sit to/from stand Pt Will Transfer to Toilet: with min guard assist;ambulating;bedside commode Pt Will Perform Toileting - Clothing Manipulation and hygiene: with supervision;sit to/from stand Pt Will Perform Tub/Shower Transfer: with min guard assist;3 in 1;Tub transfer;rolling walker;ambulating  OT Frequency: Min 1X/week   Barriers to D/C:            Co-evaluation PT/OT/SLP Co-Evaluation/Treatment: Yes Reason for Co-Treatment: For patient/therapist safety PT goals addressed during session: Mobility/safety with mobility;Strengthening/ROM OT goals addressed during session: ADL's and self-care      End of Session Equipment Utilized During Treatment: Gait belt;Rolling walker Nurse Communication: Mobility status;Other (comment) (Need for [place] and +2 assist for transfers)  Activity Tolerance: Patient limited by pain;Patient limited by fatigue Patient left: in chair;with call bell/phone within reach   Time: 1153-1230 OT Time Calculation (min): 37 min Charges:  OT General Charges $OT Visit: 1 Procedure OT Evaluation $OT Eval Moderate Complexity: 1 Procedure  Norman Herrlich, OTR/L [phone] 03/05/2016, 2:18 PM

## 2016-03-05 NOTE — Progress Notes (Signed)
   Assessment: 2 Days Post-Op  S/P Procedure(s) (LRB): INTRAMEDULLARY (IM) NAIL FEMORAL (Left) by Dr. Ernesta Amble. [name] on 03/03/16  Active Problems:   Essential hypertension, benign   Closed left hip fracture, initial encounter ([name])   Anemia   Dehydration   Intertrochanteric fracture of left hip (HCC) Acute Blood loss anemia. H/H 9.2/27.3 < 7.7/23.8 After one unit PRBC on 03/04/16  Plan: Up with therapy Follow labs Weight Bearing: Weight Bearing as Tolerated (WBAT) left leg Dressings: PRN.  VTE prophylaxis: Lovenox, SCDs, ambulation Dispo: Pending PT/OT evaluation.  Possible CIR.  Previously ambulatory with a walker. Lives with her daughter and daughter's husband.  Subjective: Patient reports pain as moderate. Pain controlled with PO meds.  Tolerating diet. No weakness, dizziness, CP, SOB. Not yet OOB.  Objective:   VITALS:   Vitals:   03/04/16 2029 03/04/16 2231 03/04/16 2325 03/05/16 0345  BP: (!) 78/40 (!) 89/43 (!) 90/45 (!) 90/56  Pulse: (!) 56 (!) 54 (!) 53 (!) 55  Resp: 16   16  Temp: 98.5 F (36.9 C)   97.8 F (36.6 C)  TempSrc: Oral   Oral  SpO2: 93%   98%  Weight:      Height:       CBC Latest Ref Rng & Units 03/04/2016 03/04/2016 03/03/2016  WBC 4.0 - 10.5 K/uL - 4.8 5.4  Hemoglobin 12.0 - 15.0 g/dL 9.2(L) 7.7(L) 10.5(L)  Hematocrit 36.0 - 46.0 % 27.3(L) 23.8(L) 31.8(L)  Platelets 150 - 400 K/uL - 98(L) 146(L)   BMP Latest Ref Rng & Units 03/04/2016 03/03/2016 03/02/2016  Glucose 65 - 99 mg/dL 118(H) 117(H) 154(H)  BUN 6 - 20 mg/dL 27(H) 29(H) 28(H)  Creatinine 0.44 - 1.00 mg/dL 1.31(H) 1.02(H) 1.22(H)  Sodium 135 - 145 mmol/L 139 140 138  Potassium 3.5 - 5.1 mmol/L 4.1 4.0 4.0  Chloride 101 - 111 mmol/L 108 108 106  CO2 22 - 32 mmol/L 23 24 26   Calcium 8.9 - 10.3 mg/dL 9.0 9.4 10.1   Intake/Output      12/20 0701 - 12/21 0700 12/21 0701 - 12/22 0700   P.O. 360    I.V. (mL/kg) 925 (10.2)    Blood 407.3    IV Piggyback     Total  Intake(mL/kg) 1692.3 (18.7)    Urine (mL/kg/hr) 500 (0.2)    Blood     Total Output 500     Net +1192.3           Physical Exam: General: NAD.  Supine in bed.  Uncomfortable appearing. Resp: No increased wob Cardio: regular rate and rhythm ABD soft, protuberant, visible ventral hernia Neurologically intact, conversant. MSK Neurovascularly intact Sensation intact distally Feet warm Dorsiflexion/Plantar flexion intact Incision: dressing C/D/I  [name] 03/05/2016, 7:16 AM

## 2016-03-05 NOTE — Progress Notes (Signed)
Physical Therapy Treatment Patient Details Name: [name] MRN: [id number] DOB: 11/20/1926 Today's Date: 03/05/2016    History of Present Illness [name] is a 82 y.o. female with a past medical history significant for HTN, multiple ortopedic and hernia surgeries, and BrCa in remission who presents with hip pain after a fall. Now s/p IM Nail for L hip fx, WBAT    PT Comments    Patient continues to require +2 assist for mobility and is limited by pain. Pt unable to take steps this session however does report that her pain level is improved from yesterday. Continue to progress as tolerated with anticipated d/c to SNF for further skilled PT services.    Follow Up Recommendations  SNF     Equipment Recommendations  3in1 (PT);Wheelchair (measurements PT);Wheelchair cushion (measurements PT)    Recommendations for Other Services       Precautions / Restrictions Precautions Precautions: Fall Restrictions Weight Bearing Restrictions: Yes LLE Weight Bearing: Weight bearing as tolerated    Mobility  Bed Mobility Overal bed mobility: Needs Assistance Bed Mobility: Supine to Sit     Supine to sit: +2 for physical assistance;Max assist;HOB elevated     General bed mobility comments: assist at [place] and trunk to come into sitting with HOB elevated and use of bed pad; cues for sequencing and technique  Transfers Overall transfer level: Needs assistance Equipment used: Rolling walker (2 wheeled);2 person hand held assist Transfers: Sit to/from [employer] Transfers Sit to Stand: Max assist;+2 physical assistance Stand pivot transfers: +2 physical assistance;Max assist       General transfer comment: sit to stand with RW and +2 to power up into standing and weight shift; mod A to maintain balance in standing however pt has tendency to attempt sitting; assist for stand pivot with +2 face to face with use of gait belt and assist to pivot bilat  feet  Ambulation/Gait                 Stairs            Wheelchair Mobility    Modified Rankin (Stroke Patients Only)       Balance Overall balance assessment: Needs assistance Sitting-balance support: Bilateral upper extremity supported;Feet supported Sitting balance-Leahy Scale: Fair       Standing balance-Leahy Scale: Poor                      Cognition Arousal/Alertness: Awake/alert Behavior During Therapy: WFL for tasks assessed/performed;Anxious Overall Cognitive Status: No family/caregiver present to determine baseline cognitive functioning Area of Impairment: Memory     Memory: Decreased short-term memory              Exercises      General Comments        Pertinent Vitals/Pain Pain Assessment: Faces Faces Pain Scale: Hurts whole lot Pain Location: L hip; yells in pain with motion Pain Descriptors / Indicators: Aching;Grimacing;Guarding;Moaning Pain Intervention(s): Limited activity within patient's tolerance;Monitored during session;Repositioned;Patient requesting pain meds-RN notified    Home Living                      Prior Function            PT Goals (current goals can now be found in the care plan section) Acute Rehab PT Goals Patient Stated Goal: less pain  Progress towards PT goals: Progressing toward goals    Frequency    Min 4X/week  PT Plan Current plan remains appropriate    Co-evaluation PT/OT/SLP Co-Evaluation/Treatment: Yes Reason for Co-Treatment: For patient/therapist safety PT goals addressed during session: Mobility/safety with mobility;Strengthening/ROM       End of Session Equipment Utilized During Treatment: Gait belt Activity Tolerance: Patient limited by pain Patient left: in chair;with call bell/phone within reach;with chair alarm set     Time: 3300-7622 PT Time Calculation (min) (ACUTE ONLY): 37 min  Charges:  $Therapeutic Activity: 8-22 mins                     G Codes:      Salina April, PTA Pager: [phone]   03/05/2016, 1:26 PM

## 2016-03-05 NOTE — Progress Notes (Signed)
Inpatient Rehabilitation  Met with patient to discuss team's recommendation for post acute therapies.  Encouraged patient to discuss her options with her family.  Plan to follow up tomorrow for decision.  Please call with questions.   Carmelia Roller., CCC/SLP Admission Coordinator  [facility]  Cell [phone]

## 2016-03-05 NOTE — Discharge Summary (Addendum)
Physician Discharge Summary  [name] [id number] DOB: 28-Feb-1927 DOA: 03/02/2016  PCP: Sherrie Mustache, MD  Admit date: 03/02/2016 Discharge date: 03/06/2016  Original d/c summary done by Dr Verlon Au on 12/21. It has been modified by Dr Wynelle Cleveland today.  Time spent: 40 minutes  Recommendations for Outpatient Follow-up:   1. Recommend CBC and Bmet in 2-3 days - f/u Cr and Hb 2. Will need reinitiation of antihypertensive meds as an outpatient 3. Recommend HbA1c in 3 months to determine if needs or meets screening treatment criterion for this   Discharge Diagnoses:  Principal Problem:   Intertrochanteric fracture of left hip (HCC) Active Problems:   Acute blood loss anemia   Essential hypertension, benign   Dehydration   CKD (chronic kidney disease) stage 3, GFR 30-59 ml/min   Discharge Condition: Fair  Diet recommendation: Heart healthy  Filed Weights   03/03/16 2046  Weight: 90.3 kg (199 lb)    History of present illness:  82 y/o HTN,  Breast cancer,  AAA, HLD  presented with a mechanical fall.  slipped and lost balance and fell after unloading some onions in her pantry.   no loss of consciousness,  any chest discomfort, shortness of breath, dizziness.  had immediate left hip pain and was unable to get up. She laid on her floor for about 3 hours until her daughter found her.  X-rays revealed a comminuted left hip intertrochanteric fracture.  Orthopedics was consulted and the patient was admitted for definitive care. Prior to her fall, the patient is able to ambulate with the assistance of a walker.   Hospital Course:  Left comminuted intertrochanteric fracture -appreciate ortho consult -had surgery 03/03/16 1. per discretion Ortho services-appreciate their input into   Anticoagulation post-op-needs Lovenox so going forward for 4-6 as per orthopedic discretion on follow-up  Weight bearing tolerance for therapy services  Wound care  Pain  management-prescription for hydrocodone 5-325 1 tab daily 6 hourly when necessary  Follow-up services required-as per orthopedics  Anemia of acute blood loss, expected post-op Hemoglobin 12--->7.7 transfused 1 U PRBC 12/20- Hb steady at 9.2 over the past 3 days Some component of hypovolemia [patient NOT in shock]  CKD 2-3 - Cr fluctuates from 0.90 to 1.3 when looking through past records - recommend recheck in 2-3 days  HTN Takes the following at home: telmisartan, HCTZ metoprolol succinate amlodipine which were held due to hypotension on 12/20 -Will need reinitiation of antihypertensive meds as an outpatient  Hyperlipidemia -Continue statin  Dehydration -Mild elevation in creatinine -Improved with IV fluid   Hyperglycemia -Hemoglobin A1c's admission was 5.3 Would not treat for diabetes at this stage would counsel regarding follow-up needed  Procedures: IM nail 12/19   Consultations:  Orthopedics Dr. Alain Marion  Discharge Exam: Vitals:   03/05/16 2100 03/06/16 0545  BP: 124/69 (!) 156/74  Pulse: 81 74  Resp: 16 16  Temp: 99.1 F (37.3 C) 97.8 F (36.6 C)    General: Alert pleasant oriented no apparent distress Cardiovascular: S1-S2 no murmur rub or gallop Respiratory: Clinically clear no added sound  Discharge Instructions   Discharge Instructions    Diet - low sodium heart healthy    Complete by:  As directed    Increase activity slowly    Complete by:  As directed      Current Discharge Medication List    START taking these medications   Details  enoxaparin (LOVENOX) 30 MG/0.3ML injection Inject 0.3 mLs (30 mg total) into the skin  daily. For 30 days post op for DVT prophylaxis Qty: 30 Syringe, Refills: 0    polyethylene glycol (MIRALAX / GLYCOLAX) packet Take 17 g by mouth daily as needed for mild constipation. Qty: 14 each, Refills: 0    senna-docusate (SENOKOT-S) 8.6-50 MG tablet Take 1 tablet by mouth at bedtime.      CONTINUE these  medications which have CHANGED   Details  HYDROcodone-acetaminophen (NORCO/VICODIN) 5-325 MG tablet Take 1 tablet by mouth every 6 (six) hours as needed. Qty: 30 tablet, Refills: 0      CONTINUE these medications which have NOT CHANGED   Details  colchicine 0.6 MG tablet Take 0.6 mg by mouth daily.      escitalopram (LEXAPRO) 10 MG tablet Take 10 mg by mouth daily.      fenofibrate (TRICOR) 145 MG tablet Take 145 mg by mouth daily.    rosuvastatin (CRESTOR) 10 MG tablet Take 10 mg by mouth daily.        STOP taking these medications     amLODipine (NORVASC) 5 MG tablet      docusate sodium (COLACE) 100 MG capsule      hydrochlorothiazide (HYDRODIURIL) 25 MG tablet      metoprolol succinate (TOPROL-XL) 25 MG 24 hr tablet      telmisartan (MICARDIS) 80 MG tablet        Allergies  Allergen Reactions  . Prednisone Swelling  . Sulfa Antibiotics Swelling   Follow-up Information    MURPHY, TIMOTHY D, MD Follow up in 2 week(s).   Specialty:  Orthopedic Surgery Contact information: [address] [place] [address] [phone]            The results of significant diagnostics from this hospitalization (including imaging, microbiology, ancillary and laboratory) are listed below for reference.    Significant Diagnostic Studies: Dg Pelvis 1-2 Views  Result Date: 03/02/2016 CLINICAL DATA:  [name] in her kitchen today after losing balance. EXAM: PELVIS - 1-2 VIEW COMPARISON:  None. FINDINGS: There is a comminuted intertrochanteric left hip fracture with varus angulation. No dislocation. No radiographic findings to suggest a pathologic basis for the fracture. Bony pelvis appears intact. IMPRESSION: Intertrochanteric left hip fracture. Electronically Signed   By: Andreas Newport M.D.   On: 03/02/2016 21:47   Dg C-arm 1-60 Min  Result Date: 03/03/2016 CLINICAL DATA:  82 year old female undergoing left femur ORIF. Initial encounter. EXAM: LEFT FEMUR 2  VIEWS; DG C-ARM 61-120 MIN COMPARISON:  03/02/2016 left femur series. FLUOROSCOPY TIME:  0 minutes 59 seconds FINDINGS: 3 intraoperative fluoroscopic views of the left femur. Left femur intramedullary rod has been placed with the proximal interlocking dynamic hip screw. These traverse the severely comminuted intertrochanteric fracture. Visible hardware appears intact. IMPRESSION: Left femur ORIF with no adverse hardware features. Electronically Signed   By: Genevie Ann M.D.   On: 03/03/2016 19:21   Dg Femur Min 2 Views Left  Result Date: 03/03/2016 CLINICAL DATA:  82 year old female undergoing left femur ORIF. Initial encounter. EXAM: LEFT FEMUR 2 VIEWS; DG C-ARM 61-120 MIN COMPARISON:  03/02/2016 left femur series. FLUOROSCOPY TIME:  0 minutes 59 seconds FINDINGS: 3 intraoperative fluoroscopic views of the left femur. Left femur intramedullary rod has been placed with the proximal interlocking dynamic hip screw. These traverse the severely comminuted intertrochanteric fracture. Visible hardware appears intact. IMPRESSION: Left femur ORIF with no adverse hardware features. Electronically Signed   By: Genevie Ann M.D.   On: 03/03/2016 19:21   Dg Femur Min  2 Views Left  Result Date: 03/02/2016 CLINICAL DATA:  Proximal femur pain and generalized left hip pain after a fall today EXAM: LEFT FEMUR 2 VIEWS COMPARISON:  CT 03/20/2015 FINDINGS: Pubic symphysis appears intact. Comminuted inter trochanteric fracture of the proximal left femur with displaced lesser trochanteric fracture fragment toward the midline. Superior migration/displacement of the distal femoral fragment/shaft. The left femoral head projects in joint. There are vascular calcifications. Degenerative changes of the left knee. IMPRESSION: Comminuted inter trochanteric fracture of the proximal left femur. Electronically Signed   By: Donavan Foil M.D.   On: 03/02/2016 21:50    Microbiology: Recent Results (from the past 240 hour(s))  MRSA PCR Screening      Status: Abnormal   Collection Time: 03/03/16  2:32 AM  Result Value Ref Range Status   MRSA by PCR POSITIVE (A) NEGATIVE Final    Comment:        The GeneXpert MRSA Assay (FDA approved for NASAL specimens only), is one component of a comprehensive MRSA colonization surveillance program. It is not intended to diagnose MRSA infection nor to guide or monitor treatment for MRSA infections. RESULT CALLED TO, READ BACK BY AND VERIFIED WITH: J ARMSTRONG,RN @0642  03/03/16 MKELLY,MLT      Labs: Basic Metabolic Panel:  Recent Labs Lab 03/02/16 2100 03/03/16 0507 03/04/16 0539  NA 138 140 139  K 4.0 4.0 4.1  CL 106 108 108  CO2 26 24 23   GLUCOSE 154* 117* 118*  BUN 28* 29* 27*  CREATININE 1.22* 1.02* 1.31*  CALCIUM 10.1 9.4 9.0   Liver Function Tests: No results for input(s): AST, ALT, ALKPHOS, BILITOT, PROT, ALBUMIN in the last 168 hours. No results for input(s): LIPASE, AMYLASE in the last 168 hours. No results for input(s): AMMONIA in the last 168 hours. CBC:  Recent Labs Lab 03/02/16 2100 03/03/16 0507 03/04/16 0539 03/04/16 1803 03/06/16 0805  WBC 8.4 5.4 4.8  --  4.1  NEUTROABS 7.4  --   --   --   --   HGB 11.7* 10.5* 7.7* 9.2* 9.2*  HCT 35.9* 31.8* 23.8* 27.3* 28.1*  MCV 90.2 88.3 89.5  --  90.1  PLT 145* 146* 98*  --  136*   Cardiac Enzymes: No results for input(s): CKTOTAL, CKMB, CKMBINDEX, TROPONINI in the last 168 hours. BNP: BNP (last 3 results) No results for input(s): BNP in the last 8760 hours.  ProBNP (last 3 results) No results for input(s): PROBNP in the last 8760 hours.  CBG: No results for input(s): GLUCAP in the last 168 hours.     SignedDebbe Odea MD   [facility] 03/06/2016, 12:18 PM
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
## 2016-03-06 ENCOUNTER — Inpatient Hospital Stay (HOSPITAL_COMMUNITY)
Admission: RE | Admit: 2016-03-06 | Discharge: 2016-03-27 | DRG: 092 | Disposition: A | Discharge status: Skilled Nursing Facility | Payer: Medicare Other | Source: Intra-hospital | Attending: Physical Medicine & Rehabilitation | Admitting: Physical Medicine & Rehabilitation

## 2016-03-06 DIAGNOSIS — M542 Cervicalgia: Secondary | ICD-10-CM

## 2016-03-06 DIAGNOSIS — R609 Edema, unspecified: Secondary | ICD-10-CM | POA: Diagnosis not present

## 2016-03-06 DIAGNOSIS — R0989 Other specified symptoms and signs involving the circulatory and respiratory systems: Secondary | ICD-10-CM | POA: Diagnosis not present

## 2016-03-06 DIAGNOSIS — N183 Chronic kidney disease, stage 3 unspecified: Secondary | ICD-10-CM

## 2016-03-06 DIAGNOSIS — D62 Acute posthemorrhagic anemia: Secondary | ICD-10-CM

## 2016-03-06 DIAGNOSIS — Z9012 Acquired absence of left breast and nipple: Secondary | ICD-10-CM

## 2016-03-06 DIAGNOSIS — E785 Hyperlipidemia, unspecified: Secondary | ICD-10-CM | POA: Diagnosis present

## 2016-03-06 DIAGNOSIS — D696 Thrombocytopenia, unspecified: Secondary | ICD-10-CM | POA: Diagnosis present

## 2016-03-06 DIAGNOSIS — K449 Diaphragmatic hernia without obstruction or gangrene: Secondary | ICD-10-CM | POA: Diagnosis present

## 2016-03-06 DIAGNOSIS — D72819 Decreased white blood cell count, unspecified: Secondary | ICD-10-CM | POA: Diagnosis not present

## 2016-03-06 DIAGNOSIS — Z888 Allergy status to other drugs, medicaments and biological substances status: Secondary | ICD-10-CM | POA: Diagnosis not present

## 2016-03-06 DIAGNOSIS — I10 Essential (primary) hypertension: Secondary | ICD-10-CM | POA: Diagnosis present

## 2016-03-06 DIAGNOSIS — R52 Pain, unspecified: Secondary | ICD-10-CM | POA: Diagnosis not present

## 2016-03-06 DIAGNOSIS — M810 Age-related osteoporosis without current pathological fracture: Secondary | ICD-10-CM | POA: Diagnosis present

## 2016-03-06 DIAGNOSIS — E441 Mild protein-calorie malnutrition: Secondary | ICD-10-CM | POA: Diagnosis present

## 2016-03-06 DIAGNOSIS — E8809 Other disorders of plasma-protein metabolism, not elsewhere classified: Secondary | ICD-10-CM

## 2016-03-06 DIAGNOSIS — Z853 Personal history of malignant neoplasm of breast: Secondary | ICD-10-CM

## 2016-03-06 DIAGNOSIS — E46 Unspecified protein-calorie malnutrition: Secondary | ICD-10-CM

## 2016-03-06 DIAGNOSIS — G8929 Other chronic pain: Secondary | ICD-10-CM | POA: Diagnosis present

## 2016-03-06 DIAGNOSIS — S72002D Fracture of unspecified part of neck of left femur, subsequent encounter for closed fracture with routine healing: Secondary | ICD-10-CM

## 2016-03-06 DIAGNOSIS — Z881 Allergy status to other antibiotic agents status: Secondary | ICD-10-CM

## 2016-03-06 DIAGNOSIS — Z6834 Body mass index (BMI) 34.0-34.9, adult: Secondary | ICD-10-CM

## 2016-03-06 DIAGNOSIS — R269 Unspecified abnormalities of gait and mobility: Principal | ICD-10-CM | POA: Diagnosis present

## 2016-03-06 DIAGNOSIS — M549 Dorsalgia, unspecified: Secondary | ICD-10-CM | POA: Diagnosis present

## 2016-03-06 DIAGNOSIS — Z96641 Presence of right artificial hip joint: Secondary | ICD-10-CM | POA: Diagnosis present

## 2016-03-06 DIAGNOSIS — G8918 Other acute postprocedural pain: Secondary | ICD-10-CM

## 2016-03-06 DIAGNOSIS — Z87891 Personal history of nicotine dependence: Secondary | ICD-10-CM | POA: Diagnosis not present

## 2016-03-06 DIAGNOSIS — Z79899 Other long term (current) drug therapy: Secondary | ICD-10-CM | POA: Diagnosis not present

## 2016-03-06 DIAGNOSIS — K5900 Constipation, unspecified: Secondary | ICD-10-CM | POA: Diagnosis present

## 2016-03-06 DIAGNOSIS — S72142D Displaced intertrochanteric fracture of left femur, subsequent encounter for closed fracture with routine healing: Secondary | ICD-10-CM

## 2016-03-06 DIAGNOSIS — N179 Acute kidney failure, unspecified: Secondary | ICD-10-CM

## 2016-03-06 DIAGNOSIS — S72145S Nondisplaced intertrochanteric fracture of left femur, sequela: Secondary | ICD-10-CM

## 2016-03-06 DIAGNOSIS — M109 Gout, unspecified: Secondary | ICD-10-CM | POA: Diagnosis present

## 2016-03-06 DIAGNOSIS — M25512 Pain in left shoulder: Secondary | ICD-10-CM | POA: Diagnosis present

## 2016-03-06 DIAGNOSIS — Z0000 Encounter for general adult medical examination without abnormal findings: Secondary | ICD-10-CM

## 2016-03-06 DIAGNOSIS — W19XXXA Unspecified fall, initial encounter: Secondary | ICD-10-CM

## 2016-03-06 DIAGNOSIS — R600 Localized edema: Secondary | ICD-10-CM | POA: Diagnosis not present

## 2016-03-06 DIAGNOSIS — S72002A Fracture of unspecified part of neck of left femur, initial encounter for closed fracture: Secondary | ICD-10-CM | POA: Diagnosis present

## 2016-03-06 DIAGNOSIS — M25552 Pain in left hip: Secondary | ICD-10-CM

## 2016-03-06 DIAGNOSIS — W19XXXD Unspecified fall, subsequent encounter: Secondary | ICD-10-CM

## 2016-03-06 DIAGNOSIS — I728 Aneurysm of other specified arteries: Secondary | ICD-10-CM | POA: Diagnosis present

## 2016-03-06 DIAGNOSIS — D709 Neutropenia, unspecified: Secondary | ICD-10-CM | POA: Diagnosis present

## 2016-03-06 DIAGNOSIS — Z419 Encounter for procedure for purposes other than remedying health state, unspecified: Secondary | ICD-10-CM

## 2016-03-06 DIAGNOSIS — E860 Dehydration: Secondary | ICD-10-CM

## 2016-03-06 DIAGNOSIS — R410 Disorientation, unspecified: Secondary | ICD-10-CM | POA: Diagnosis present

## 2016-03-06 DIAGNOSIS — S72142A Displaced intertrochanteric fracture of left femur, initial encounter for closed fracture: Principal | ICD-10-CM

## 2016-03-06 DIAGNOSIS — S72002S Fracture of unspecified part of neck of left femur, sequela: Secondary | ICD-10-CM | POA: Diagnosis not present

## 2016-03-06 LAB — CBC
HCT: 28.1 % — ABNORMAL LOW (ref 36.0–46.0)
HEMOGLOBIN: 9.2 g/dL — AB (ref 12.0–15.0)
MCH: 29.5 pg (ref 26.0–34.0)
MCHC: 32.7 g/dL (ref 30.0–36.0)
MCV: 90.1 fL (ref 78.0–100.0)
PLATELETS: 136 10*3/uL — AB (ref 150–400)
RBC: 3.12 MIL/uL — AB (ref 3.87–5.11)
RDW: 14 % (ref 11.5–15.5)
WBC: 4.1 10*3/uL (ref 4.0–10.5)

## 2016-03-06 MED ORDER — ROSUVASTATIN CALCIUM 10 MG PO TABS
10.0000 mg | ORAL_TABLET | Freq: Every day | ORAL | Status: DC
  Administered 2016-03-07 – 2016-03-27 (×21): 10 mg via ORAL
  Filled 2016-03-06 (×21): qty 1

## 2016-03-06 MED ORDER — BISACODYL 10 MG RE SUPP: 10.0000 mg | Freq: Every day | RECTAL | Status: DC | PRN

## 2016-03-06 MED ORDER — TRAMADOL HCL 50 MG PO TABS
50.0000 mg | ORAL_TABLET | Freq: Four times a day (QID) | ORAL | Status: DC | PRN
  Administered 2016-03-06 – 2016-03-17 (×6): 50 mg via ORAL
  Filled 2016-03-06 (×6): qty 1

## 2016-03-06 MED ORDER — ONDANSETRON HCL 4 MG/2ML IJ SOLN: 4.0000 mg | Freq: Four times a day (QID) | INTRAMUSCULAR | Status: DC | PRN

## 2016-03-06 MED ORDER — ONDANSETRON HCL 4 MG PO TABS
4.0000 mg | ORAL_TABLET | Freq: Four times a day (QID) | ORAL | Status: DC | PRN
  Administered 2016-03-15 – 2016-03-19 (×2): 4 mg via ORAL
  Filled 2016-03-06 (×3): qty 1

## 2016-03-06 MED ORDER — GUAIFENESIN-DM 100-10 MG/5ML PO SYRP: 5.0000 mL | ORAL_SOLUTION | Freq: Four times a day (QID) | ORAL | Status: DC | PRN

## 2016-03-06 MED ORDER — ALUM & MAG HYDROXIDE-SIMETH 200-200-20 MG/5ML PO SUSP
30.0000 mL | ORAL | Status: DC | PRN
  Administered 2016-03-15: 30 mL via ORAL
  Filled 2016-03-06: qty 30

## 2016-03-06 MED ORDER — FLEET ENEMA 7-19 GM/118ML RE ENEM: 1.0000 | ENEMA | Freq: Once | RECTAL | Status: DC | PRN

## 2016-03-06 MED ORDER — DICLOFENAC SODIUM 1 % TD GEL
2.0000 g | Freq: Three times a day (TID) | TRANSDERMAL | Status: DC
  Administered 2016-03-06 – 2016-03-26 (×32): 2 g via TOPICAL
  Filled 2016-03-06: qty 100

## 2016-03-06 MED ORDER — TROLAMINE SALICYLATE 10 % EX CREA: TOPICAL_CREAM | Freq: Three times a day (TID) | CUTANEOUS | Status: DC

## 2016-03-06 MED ORDER — METOPROLOL SUCCINATE ER 25 MG PO TB24
12.5000 mg | ORAL_TABLET | Freq: Every day | ORAL | Status: DC
  Administered 2016-03-07 – 2016-03-27 (×21): 12.5 mg via ORAL
  Filled 2016-03-06 (×21): qty 1

## 2016-03-06 MED ORDER — PROCHLORPERAZINE EDISYLATE 5 MG/ML IJ SOLN: 5.0000 mg | Freq: Four times a day (QID) | INTRAMUSCULAR | Status: DC | PRN

## 2016-03-06 MED ORDER — FENOFIBRATE 160 MG PO TABS
160.0000 mg | ORAL_TABLET | Freq: Every day | ORAL | Status: DC
  Administered 2016-03-07 – 2016-03-26 (×20): 160 mg via ORAL
  Filled 2016-03-06 (×21): qty 1

## 2016-03-06 MED ORDER — PROCHLORPERAZINE MALEATE 5 MG PO TABS: 5.0000 mg | ORAL_TABLET | Freq: Four times a day (QID) | ORAL | Status: DC | PRN

## 2016-03-06 MED ORDER — HYDROCERIN EX CREA
TOPICAL_CREAM | Freq: Two times a day (BID) | CUTANEOUS | Status: DC
  Administered 2016-03-06 – 2016-03-14 (×16): via TOPICAL
  Administered 2016-03-14: 1 via TOPICAL
  Administered 2016-03-15: 13:00:00 via TOPICAL
  Administered 2016-03-15: 1 via TOPICAL
  Administered 2016-03-16 (×2): via TOPICAL
  Administered 2016-03-17: 1 via TOPICAL
  Administered 2016-03-17 – 2016-03-27 (×20): via TOPICAL
  Filled 2016-03-06 (×2): qty 113

## 2016-03-06 MED ORDER — OXYCODONE HCL 5 MG PO TABS
5.0000 mg | ORAL_TABLET | ORAL | Status: DC | PRN
  Administered 2016-03-10: 5 mg via ORAL
  Administered 2016-03-11 – 2016-03-16 (×9): 10 mg via ORAL
  Filled 2016-03-06 (×12): qty 2

## 2016-03-06 MED ORDER — POLYETHYLENE GLYCOL 3350 17 G PO PACK
17.0000 g | PACK | Freq: Every day | ORAL | Status: DC
  Administered 2016-03-06 – 2016-03-27 (×22): 17 g via ORAL
  Filled 2016-03-06 (×22): qty 1

## 2016-03-06 MED ORDER — OXYCODONE HCL 5 MG PO TABS
10.0000 mg | ORAL_TABLET | Freq: Two times a day (BID) | ORAL | Status: DC
  Administered 2016-03-07 – 2016-03-24 (×34): 10 mg via ORAL
  Filled 2016-03-06 (×34): qty 2

## 2016-03-06 MED ORDER — COLCHICINE 0.6 MG PO TABS
0.6000 mg | ORAL_TABLET | Freq: Every day | ORAL | Status: DC
  Administered 2016-03-07 – 2016-03-23 (×17): 0.6 mg via ORAL
  Filled 2016-03-06 (×17): qty 1

## 2016-03-06 MED ORDER — FLEET ENEMA 7-19 GM/118ML RE ENEM
1.0000 | ENEMA | Freq: Once | RECTAL | Status: AC
  Administered 2016-03-06: 1 via RECTAL
  Filled 2016-03-06: qty 1

## 2016-03-06 MED ORDER — POLYETHYLENE GLYCOL 3350 17 G PO PACK: 17.0000 g | PACK | Freq: Once | ORAL | Status: DC

## 2016-03-06 MED ORDER — ACETAMINOPHEN 325 MG PO TABS
650.0000 mg | ORAL_TABLET | Freq: Three times a day (TID) | ORAL | Status: DC
  Administered 2016-03-06 – 2016-03-27 (×80): 650 mg via ORAL
  Filled 2016-03-06 (×81): qty 2

## 2016-03-06 MED ORDER — MUPIROCIN 2 % EX OINT
1.0000 "application " | TOPICAL_OINTMENT | Freq: Two times a day (BID) | CUTANEOUS | Status: AC
  Administered 2016-03-06 – 2016-03-08 (×5): 1 via NASAL
  Filled 2016-03-06: qty 22

## 2016-03-06 MED ORDER — METOPROLOL SUCCINATE ER 25 MG PO TB24: 25.0000 mg | ORAL_TABLET | Freq: Every day | ORAL | Status: DC

## 2016-03-06 MED ORDER — METHOCARBAMOL 500 MG PO TABS: 500.0000 mg | ORAL_TABLET | Freq: Four times a day (QID) | ORAL | Status: DC | PRN

## 2016-03-06 MED ORDER — CHLORHEXIDINE GLUCONATE CLOTH 2 % EX PADS
6.0000 | MEDICATED_PAD | Freq: Every day | CUTANEOUS | Status: AC
  Administered 2016-03-07 – 2016-03-08 (×2): 6 via TOPICAL

## 2016-03-06 MED ORDER — BISACODYL 10 MG RE SUPP: 10.0000 mg | Freq: Once | RECTAL | Status: DC

## 2016-03-06 MED ORDER — DIPHENHYDRAMINE HCL 12.5 MG/5ML PO ELIX: 12.5000 mg | ORAL_SOLUTION | Freq: Four times a day (QID) | ORAL | Status: DC | PRN

## 2016-03-06 MED ORDER — PROCHLORPERAZINE 25 MG RE SUPP: 12.5000 mg | Freq: Four times a day (QID) | RECTAL | Status: DC | PRN

## 2016-03-06 MED ORDER — ENOXAPARIN SODIUM 30 MG/0.3ML ~~LOC~~ SOLN
30.0000 mg | SUBCUTANEOUS | Status: DC
  Administered 2016-03-06: 30 mg via SUBCUTANEOUS
  Filled 2016-03-06: qty 0.3

## 2016-03-06 MED ORDER — ENSURE ENLIVE PO LIQD
237.0000 mL | Freq: Every day | ORAL | Status: DC
  Administered 2016-03-07 – 2016-03-26 (×12): 237 mL via ORAL

## 2016-03-06 MED ORDER — ESCITALOPRAM OXALATE 10 MG PO TABS
10.0000 mg | ORAL_TABLET | Freq: Every day | ORAL | Status: DC
  Administered 2016-03-07 – 2016-03-27 (×21): 10 mg via ORAL
  Filled 2016-03-06 (×21): qty 1

## 2016-03-06 MED ORDER — MUSCLE RUB 10-15 % EX CREA
TOPICAL_CREAM | Freq: Three times a day (TID) | CUTANEOUS | Status: DC
  Administered 2016-03-06: 1 via TOPICAL
  Administered 2016-03-07 – 2016-03-20 (×25): via TOPICAL
  Administered 2016-03-22: 1 via TOPICAL
  Administered 2016-03-24 – 2016-03-26 (×3): via TOPICAL
  Filled 2016-03-06: qty 85

## 2016-03-06 MED ORDER — TRAZODONE HCL 50 MG PO TABS
25.0000 mg | ORAL_TABLET | Freq: Every evening | ORAL | Status: DC | PRN
  Administered 2016-03-14 – 2016-03-17 (×2): 50 mg via ORAL
  Filled 2016-03-06 (×2): qty 1

## 2016-03-06 NOTE — Progress Notes (Signed)
Inpatient Rehabilitation  I have medical clearance and a bed available to offer to patient today.  Family is in agreement with plan and I will proceed with admission this afternoon. Please call with questions.   Carmelia Roller., CCC/SLP Admission Coordinator  [place]  Cell [phone]
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
## 2016-03-06 NOTE — Plan of Care (Signed)
Problem: RH BOWEL ELIMINATION Goal: RH STG MANAGE BOWEL WITH ASSISTANCE STG Manage Bowel with Assistance. Mod I  Outcome: Progressing Received [place] enema with good results

## 2016-03-06 NOTE — Clinical Social Work Note (Addendum)
Clinical Social Work Assessment  Patient Details  Name: [name] MRN: [id number] Date of Birth: 1926/12/01  Date of referral:  03/06/16               Reason for consult:  Facility Placement                Permission sought to share information with:  Family Supports Permission granted to share information::     Name::        Agency::     Relationship::     Contact Information:     Housing/Transportation Living arrangements for the past 2 months:  Single Family Home Source of Information:  Patient Patient Interpreter Needed:  None Criminal Activity/Legal Involvement Pertinent to Current Situation/Hospitalization:  No - Comment as needed Significant Relationships:  Adult Children Lives with:  Self Do you feel safe going back to the place where you live?  Yes Need for family participation in patient care:  Yes (Comment)  Care giving concerns:  Pt friend at bedside during initial assessment. Pt verbalized permission for CSW to speak in front of her friend.   Social Worker assessment / plan:  CSW spoke with pt at bedside. Pt is from home alone however has adult children who are able to help with her care. Pt has [facility] to [facility] in the past. Pt is agreeable to SNF however, [facility] is first choice. CIR will determine if they can provide pt a bed this afternoon. Pt suggest CSW reach out to pt's daughter for any questions or any placement preferences. CSW spoke with pt's daughter and CIR is where pt's family wants her to go however if CIR is not able to provide pt a bed [facility] will be the back up plan. CSW will reach out to facility and continue to follow for placement needs.  Employment status:  Retired Forensic scientist:  Medicare PT Recommendations:  [facility] / Referral to community resources:  [place]  Patient/Family's Response to care:  Pt verbalized understanding of CSW role and appreciation of support. Pt denies  any concerns regarding pt care at this time.   Patient/Family's Understanding of and Emotional Response to Diagnosis, Current Treatment, and Prognosis:  Pt understanding and realistic regarding physical limitations. Pt familiar with SNF process. Pt is agreeable to SNF however prefers [facility]. CSW will continue to follow regarding [facility] bed availability this afternoon. Pt denies any concern regarding her treatment plan.   Emotional Assessment Appearance:  Appears stated age Attitude/Demeanor/Rapport:   (Patient was appropriate.) Affect (typically observed):  Accepting, Appropriate, Calm Orientation:  Oriented to Situation, Oriented to  Time, Oriented to Place, Oriented to Self Alcohol / Substance use:  Not Applicable Psych involvement (Current and /or in the community):  No (Comment)  Discharge Needs  Concerns to be addressed:  No discharge needs identified Readmission within the last 30 days:  No Current discharge risk:  Dependent with Mobility Barriers to Discharge:  Continued Medical Work up   [employer], LCSW 03/06/2016, 11:14 AM

## 2016-03-06 NOTE — Progress Notes (Addendum)
Physical Therapy Treatment Patient Details Name: [name] MRN: [id number] DOB: [birth year] Today's Date: 03/06/2016    History of Present Illness [name] is a 82 y.o. female with a past medical history significant for HTN, multiple ortopedic and hernia surgeries, and BrCa in remission who presents with hip pain after a fall. Now s/p IM Nail for L hip fx, WBAT    PT Comments    Patient is making gradual progress toward mobility goals and tolerated L LE weightbearing more so today than yesterday's session. Stedy used this session for sit to stand transfers. Recommending [facility] for further skilled PT services to maximize independence and safety with mobility prior to d/c home with assistance from daughter. Continue to progress as tolerated.   Follow Up Recommendations  [facility]     Equipment Recommendations  3in1 (PT);Wheelchair (measurements PT);Wheelchair cushion (measurements PT)    Recommendations for Other Services       Precautions / Restrictions Precautions Precautions: Fall Restrictions Weight Bearing Restrictions: Yes LLE Weight Bearing: Weight bearing as tolerated    Mobility  Bed Mobility Overal bed mobility: Needs Assistance Bed Mobility: Supine to Sit     Supine to sit: +2 for physical assistance;Max assist;HOB elevated     General bed mobility comments: assist at [place] and trunk to come into sitting with HOB elevated and use of bed pad; cues for sequencing and technique  Transfers Overall transfer level: Needs assistance   Transfers: Sit to/from Stand Sit to Stand: Max assist;+2 physical assistance;Mod assist;From elevated surface         General transfer comment: max A +2 with use of gait belt and bed pad from EOB and mod A +2 from [place] (elevated surface); cues for hand placement and technique; pt able to increase tolerance to standing and L LE weight bearing second trial; worked on weight shifting and balance with min guard A and  cues for posture X3 mins  Ambulation/Gait                 Stairs            Wheelchair Mobility    Modified Rankin (Stroke Patients Only)       Balance Overall balance assessment: Needs assistance Sitting-balance support: Bilateral upper extremity supported;Feet supported Sitting balance-Leahy Scale: Fair       Standing balance-Leahy Scale: Poor                      Cognition Arousal/Alertness: Awake/alert Behavior During Therapy: WFL for tasks assessed/performed;Anxious Overall Cognitive Status: No family/caregiver present to determine baseline cognitive functioning Area of Impairment: Memory     Memory: Decreased short-term memory              Exercises      General Comments        Pertinent Vitals/Pain Pain Assessment: Faces Faces Pain Scale: Hurts even more Pain Location: L hip and L shoulderyells in pain with motion Pain Descriptors / Indicators: Aching;Grimacing;Guarding;Moaning Pain Intervention(s): Limited activity within patient's tolerance;Monitored during session;Repositioned;Ice applied;Heat applied (ice to hip and heat to shoulder)    Home Living                      Prior Function            PT Goals (current goals can now be found in the care plan section) Acute Rehab PT Goals Patient Stated Goal: "do more on my own" Progress towards PT  goals: Progressing toward goals    Frequency    Min 4X/week      PT Plan Discharge plan needs to be updated    Co-evaluation             End of Session Equipment Utilized During Treatment: Gait belt Activity Tolerance: Patient limited by pain Patient left: in chair;with call bell/phone within reach;with chair alarm set     Time: 1113-1150 PT Time Calculation (min) (ACUTE ONLY): 37 min  Charges:  $Therapeutic Activity: 23-37 mins                    G Codes:      Salina April, PTA Pager: [phone]   03/06/2016, 12:16  PM
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
## 2016-03-06 NOTE — Progress Notes (Signed)
   Assessment: 3 Days Post-Op  S/P Procedure(s) (LRB): INTRAMEDULLARY (IM) NAIL FEMORAL (Left) by Dr. Ernesta Amble. [name] on 03/03/16  Active Problems:   Essential hypertension, benign   Closed left hip fracture, initial encounter ([place])   Anemia   Dehydration   Intertrochanteric fracture of left hip (HCC) Acute Blood loss anemia. H/H 9.2/27.3 < 7.7/23.8 After one unit PRBC on 03/04/16.  CBC pending.  Plan: Up with therapy Follow labs Bowel regimen - has history of constipation needing enema and current large ventral hernia. Incentive Spirometry Weight Bearing: Weight Bearing as Tolerated (WBAT) left leg Dressings: PRN.  VTE prophylaxis: Lovenox, SCDs, ambulation Dispo: Pending PT/OT evaluation.  Possible CIR.  Previously ambulatory with a walker. Lives with her daughter and daughter's husband.  Subjective: Patient reports pain as moderate. Pain controlled with PO meds.  Tolerating diet. No weakness, dizziness, CP, SOB. Not yet OOB.  No recent BM.  Objective:   VITALS:   Vitals:   03/05/16 0900 03/05/16 1500 03/05/16 2100 03/06/16 0545  BP: (!) 96/48 (!) 108/54 124/69 (!) 156/74  Pulse: 60 65 81 74  Resp:  16 16 16   Temp:  97.6 F (36.4 C) 99.1 F (37.3 C) 97.8 F (36.6 C)  TempSrc:  Oral Oral Oral  SpO2:  98% 97% 95%  Weight:      Height:       CBC Latest Ref Rng & Units 03/04/2016 03/04/2016 03/03/2016  WBC 4.0 - 10.5 K/uL - 4.8 5.4  Hemoglobin 12.0 - 15.0 g/dL 9.2(L) 7.7(L) 10.5(L)  Hematocrit 36.0 - 46.0 % 27.3(L) 23.8(L) 31.8(L)  Platelets 150 - 400 K/uL - 98(L) 146(L)   BMP Latest Ref Rng & Units 03/04/2016 03/03/2016 03/02/2016  Glucose 65 - 99 mg/dL 118(H) 117(H) 154(H)  BUN 6 - 20 mg/dL 27(H) 29(H) 28(H)  Creatinine 0.44 - 1.00 mg/dL 1.31(H) 1.02(H) 1.22(H)  Sodium 135 - 145 mmol/L 139 140 138  Potassium 3.5 - 5.1 mmol/L 4.1 4.0 4.0  Chloride 101 - 111 mmol/L 108 108 106  CO2 22 - 32 mmol/L 23 24 26   Calcium 8.9 - 10.3 mg/dL 9.0 9.4 10.1    Intake/Output      12/21 0701 - 12/22 0700 12/22 0701 - 12/23 0700   P.O. 840    I.V. (mL/kg)     Blood     Total Intake(mL/kg) 840 (9.3)    Urine (mL/kg/hr) 3 (0)    Total Output 3     Net +837           Physical Exam: General: NAD.  Supine in bed.  Intermittently uncomfortable appearing. Resp: No increased wob Cardio: regular rate and rhythm ABD soft, protuberant, visible ventral hernia Neurologically intact, conversant.  MSK Neurovascularly intact Sensation intact distally Feet warm Dorsiflexion/Plantar flexion intact Incision: dressing C/D/I  Prudencio Burly III 03/06/2016, 8:15 AM
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
## 2016-03-06 NOTE — Clinical Social Work Note (Signed)
Per CSW conversation with pt and pt family yesterday they decided on CIR. Per CIR note pt and pt family will make a final decision today. CSW will continue to follow. CSW will facilitate SNF placement if needed.  [address], [place]
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
## 2016-03-07 ENCOUNTER — Inpatient Hospital Stay (HOSPITAL_COMMUNITY): Payer: Medicare Other

## 2016-03-07 ENCOUNTER — Inpatient Hospital Stay (HOSPITAL_COMMUNITY): Payer: Medicare Other | Admitting: Physical Therapy

## 2016-03-07 ENCOUNTER — Inpatient Hospital Stay (HOSPITAL_COMMUNITY): Payer: Medicare Other | Admitting: Occupational Therapy

## 2016-03-07 DIAGNOSIS — R609 Edema, unspecified: Secondary | ICD-10-CM

## 2016-03-07 DIAGNOSIS — E8809 Other disorders of plasma-protein metabolism, not elsewhere classified: Secondary | ICD-10-CM

## 2016-03-07 DIAGNOSIS — I10 Essential (primary) hypertension: Secondary | ICD-10-CM

## 2016-03-07 DIAGNOSIS — S72002A Fracture of unspecified part of neck of left femur, initial encounter for closed fracture: Secondary | ICD-10-CM

## 2016-03-07 DIAGNOSIS — D72819 Decreased white blood cell count, unspecified: Secondary | ICD-10-CM

## 2016-03-07 DIAGNOSIS — D696 Thrombocytopenia, unspecified: Secondary | ICD-10-CM

## 2016-03-07 DIAGNOSIS — D62 Acute posthemorrhagic anemia: Secondary | ICD-10-CM

## 2016-03-07 DIAGNOSIS — G8918 Other acute postprocedural pain: Secondary | ICD-10-CM

## 2016-03-07 DIAGNOSIS — E46 Unspecified protein-calorie malnutrition: Secondary | ICD-10-CM

## 2016-03-07 LAB — CBC WITH DIFFERENTIAL/PLATELET
BASOS ABS: 0 10*3/uL (ref 0.0–0.1)
BASOS PCT: 1 %
Eosinophils Absolute: 0.2 10*3/uL (ref 0.0–0.7)
Eosinophils Relative: 6 %
HEMATOCRIT: 27.5 % — AB (ref 36.0–46.0)
HEMOGLOBIN: 8.8 g/dL — AB (ref 12.0–15.0)
LYMPHS PCT: 16 %
Lymphs Abs: 0.6 10*3/uL — ABNORMAL LOW (ref 0.7–4.0)
MCH: 29.1 pg (ref 26.0–34.0)
MCHC: 32 g/dL (ref 30.0–36.0)
MCV: 91.1 fL (ref 78.0–100.0)
Monocytes Absolute: 0.4 10*3/uL (ref 0.1–1.0)
Monocytes Relative: 10 %
NEUTROS ABS: 2.3 10*3/uL (ref 1.7–7.7)
NEUTROS PCT: 67 %
Platelets: 164 10*3/uL (ref 150–400)
RBC: 3.02 MIL/uL — AB (ref 3.87–5.11)
RDW: 13.9 % (ref 11.5–15.5)
WBC: 3.5 10*3/uL — ABNORMAL LOW (ref 4.0–10.5)

## 2016-03-07 LAB — TYPE AND SCREEN
ABO/RH(D): O POS
ANTIBODY SCREEN: NEGATIVE
UNIT DIVISION: 0
Unit division: 0

## 2016-03-07 LAB — COMPREHENSIVE METABOLIC PANEL
ALK PHOS: 51 U/L (ref 38–126)
ALT: 9 U/L — AB (ref 14–54)
AST: 35 U/L (ref 15–41)
Albumin: 2.6 g/dL — ABNORMAL LOW (ref 3.5–5.0)
Anion gap: 8 (ref 5–15)
BILIRUBIN TOTAL: 0.9 mg/dL (ref 0.3–1.2)
BUN: 23 mg/dL — AB (ref 6–20)
CALCIUM: 9.9 mg/dL (ref 8.9–10.3)
CO2: 23 mmol/L (ref 22–32)
CREATININE: 0.82 mg/dL (ref 0.44–1.00)
Chloride: 109 mmol/L (ref 101–111)
GFR calc Af Amer: >60 mL/min (ref >60)
Glucose, Bld: 112 mg/dL — ABNORMAL HIGH (ref 65–99)
POTASSIUM: 4.8 mmol/L (ref 3.5–5.1)
Sodium: 140 mmol/L (ref 135–145)
TOTAL PROTEIN: 6.2 g/dL — AB (ref 6.5–8.1)

## 2016-03-07 MED ORDER — PRO-STAT SUGAR FREE PO LIQD
30.0000 mL | Freq: Two times a day (BID) | ORAL | Status: DC
  Administered 2016-03-07 – 2016-03-27 (×41): 30 mL via ORAL
  Filled 2016-03-07 (×40): qty 30

## 2016-03-07 MED ORDER — ENOXAPARIN SODIUM 40 MG/0.4ML ~~LOC~~ SOLN
40.0000 mg | SUBCUTANEOUS | Status: DC
  Administered 2016-03-07 – 2016-03-26 (×20): 40 mg via SUBCUTANEOUS
  Filled 2016-03-07 (×20): qty 0.4

## 2016-03-07 MED ORDER — HYDROCHLOROTHIAZIDE 12.5 MG PO CAPS
12.5000 mg | ORAL_CAPSULE | Freq: Every day | ORAL | Status: DC
  Administered 2016-03-07 – 2016-03-11 (×5): 12.5 mg via ORAL
  Filled 2016-03-07 (×5): qty 1

## 2016-03-07 NOTE — IPOC Note (Addendum)
Overall Plan of Care [facility]) Patient Details Name: [name] MRN: [id number] DOB: 24-May-1926  Admitting Diagnosis: Hip FX  Hospital Problems: Active Problems:   Closed left hip fracture, initial encounter ([place])   Hypoalbuminemia due to protein-calorie malnutrition ([place])   Leukopenia     Functional Problem List: Nursing Bladder, Bowel, Pain, Skin Integrity  PT Balance, Endurance, Motor, Pain, Safety  OT Balance, Safety, Edema, Endurance, Motor, Pain  SLP    TR         Basic ADL's: OT Grooming, Bathing, Dressing, Toileting     Advanced  ADL's: OT       Transfers: PT Bed Mobility, Bed to Chair, Car  OT Toilet     Locomotion: PT Ambulation, Stairs, Wheelchair Mobility     Additional Impairments: OT    SLP        TR      Anticipated Outcomes Item Anticipated Outcome  Self Feeding N/A  Swallowing      Basic self-care  Supervision  Insurance underwriter Transfers Supervision   Bowel/Bladder  Mod I  Transfers  S  Locomotion  S household distances with LRAD, minA stairs  Communication     Cognition     Pain  <3  Safety/Judgment  Supervision   Therapy Plan: PT Intensity: Minimum of 1-2 x/day ,45 to 90 minutes PT Frequency: 5 out of 7 days PT Duration Estimated Length of Stay: 21-24 days OT Intensity: Minimum of 1-2 x/day, 45 to 90 minutes OT Frequency: 5 out of 7 days OT Duration/Estimated Length of Stay: 28-30 days         Team Interventions: Nursing Interventions Patient/Family Education, Bladder Management, Bowel Management, Pain Management, Skin Care/Wound Management  PT interventions Ambulation/gait training, Training and development officer, DME/adaptive equipment instruction, Community reintegration, Barrister's clerk education, IT trainer, UE/LE Coordination activities, UE/LE Strength taining/ROM, Therapeutic Exercise, Therapeutic Activities, Wheelchair propulsion/positioning, Functional mobility training, Discharge  planning, Disease management/prevention, Neuromuscular re-education, Pain management  OT Interventions Discharge planning, Pain management, Self Care/advanced ADL retraining, Therapeutic Activities, Functional mobility training, Patient/family education, Therapeutic Exercise, DME/adaptive equipment instruction, UE/LE Strength taining/ROM, Wheelchair propulsion/positioning  SLP Interventions    TR Interventions    SW/CM Interventions  Pt/Family Educatin, Discharge Planning, Psychosocial Assessment    Team Discharge Planning: Destination: PT-Home ,OT- Home , SLP-  Projected Follow-up: PT-Home health PT, OT-  Home health OT, SLP-  Projected Equipment Needs: PT- , OT- To be determined, SLP-  Equipment Details: PT-has RW, rollator, cane, w/c, OT-  Patient/family involved in discharge planning: PT- Patient, Family member/caregiver,  OT-Patient, SLP-   MD ELOS: 14-18 days.  Medical Rehab Prognosis:  Good Assessment: 82 year old female with history of HTN, breast cancer, AAA, macular degeneration, OA who sustained a fall and laid on the floor for 3 hours due to inability to get up. She was admitted on 03/02/16 and found to have comminuted left IT hip fracture. She underwent IM nailing of hip by Dr. [name] on 12/19 and is WBAT. Post op with ABLA. She was transfused with I unit PRBC and BP meds being held due to hypotension. Pt with resulting functional deficits with endurance, weakness, and mobility.  Will set goals for supervision with PT/OT.     See Team Conference Notes for weekly updates to the plan of care

## 2016-03-07 NOTE — Progress Notes (Signed)
L shoulder painful, swelling noted at top of shoulder, base of neck. Will pass to oncoming RN.

## 2016-03-07 NOTE — H&P (Signed)
Physical Medicine and Rehabilitation Admission H&P    Chief Complaint  Patient presents with  . Left hip fracture    HPI:  [name] is an 82 year old female with history of HTN, breast cancer, AAA, macular degeneration, OA who sustained a fall and laid on the floor for 3 hours due to inability to get up. She was admitted on 03/02/16 and found to have comminuted left IT hip fracture. She underwent IM nailing of hip by Dr. [name] on 12/19 and is WBAT. Post op with ABLA with drop in Hgb from 12-->7.7. She was transfused with I unit PRBC and BP meds being held due to hypotension. Therapy ongoing and patient limited by difficulty weight bearing thorough LLE pain and inability to ambulate or perform ADL tasks.  CIR recommended for follow up therapy and patient admitted yesterday evening.    Review of Systems  HENT: Positive for hearing loss. Negative for tinnitus.   Eyes: Positive for blurred vision (left eye). Negative for double vision.  Respiratory: Positive for shortness of breath. Negative for cough.   Cardiovascular: Negative for chest pain and leg swelling.  Gastrointestinal: Positive for abdominal pain (left hernia pain) and constipation (NO BM since admission). Negative for diarrhea, heartburn and nausea.  Genitourinary: Positive for urgency.       Chronic yeast infection--inguinal area. Gets up three times at night.    Musculoskeletal: Positive for back pain (hurts all the time ), falls (multiple ) and joint pain.       Left shoulder pain since fall.  Skin: Positive for itching. Negative for rash.  Neurological: Negative for dizziness, sensory change, speech change and headaches.  Psychiatric/Behavioral: Negative for depression. The patient has insomnia (at times. ). The patient is not nervous/anxious.   All other systems reviewed and are negative.     Past Medical History:  Diagnosis Date  . Abdominal aortic aneurysm ([place])   . Arthritis   . Cancer (H[facility])    Left Breast , Dr. Cherylann Banas  . Colon polyps    Benign  . Complication of anesthesia   . Gout   . H/O hiatal hernia   . Hyperlipidemia   . Hypertension    sees Dr. Edrick Oh, saw last approx. 1 month ago  . Osteoporosis   . PONV (postoperative nausea and vomiting)   . Psoriasis   . Splenic artery aneurysm [facility])     Past Surgical History:  Procedure Laterality Date  . ABDOMINAL AORTIC ANEURYSM REPAIR  09/09/2009   Dacron Graft repair  by Dr. Ruta Hinds  . EYE SURGERY     left cataract removal  . FEMUR IM NAIL Left 03/03/2016   Procedure: INTRAMEDULLARY (IM) NAIL FEMORAL;  Surgeon: Renette Butters, MD;  Location: [place];  Service: Orthopedics;  Laterality: Left;  . HERNIA REPAIR    . JOINT REPLACEMENT  2008   Right Total Hip  . MASTECTOMY  08/22/2001   Left  . ORIF DISTAL RADIUS FRACTURE      Family History  Problem Relation Age of Onset  . Cancer Sister     ovarian cancer  . Stroke Sister   . Cancer Brother     Lung Cancer    Social History:  Lives with family--who works days. Was independent with walker--sedentary and walker for household distances and wheelchair for out of home. Sponge bathes.  She  reports that she quit smoking about 11 years ago. Her smoking use included Cigarettes. She quit after 15.00  years of use. She has never used smokeless tobacco. She reports that she does not drink alcohol or use drugs.     Allergies  Allergen Reactions  . Prednisone Swelling  . Sulfa Antibiotics Swelling    Medications Prior to Admission  Medication Sig Dispense Refill  . amLODipine (NORVASC) 5 MG tablet Take 5 mg by mouth daily.      . colchicine 0.6 MG tablet Take 0.6 mg by mouth daily.      [name] Kitchen docusate sodium (COLACE) 100 MG capsule Take 200 mg by mouth daily as needed for moderate constipation.     [name] Kitchen escitalopram (LEXAPRO) 10 MG tablet Take 10 mg by mouth daily.      . fenofibrate (TRICOR) 145 MG tablet Take 145 mg by mouth daily.    . hydrochlorothiazide  (HYDRODIURIL) 25 MG tablet Take 25 mg by mouth daily.    . metoprolol succinate (TOPROL-XL) 25 MG 24 hr tablet Take 25 mg by mouth daily.      . rosuvastatin (CRESTOR) 10 MG tablet Take 10 mg by mouth daily.      [name] Kitchen telmisartan (MICARDIS) 80 MG tablet Take 80 mg by mouth daily.      . [DISCONTINUED] HYDROcodone-acetaminophen (NORCO/VICODIN) 5-325 MG tablet Take 1 tablet by mouth every 6 (six) hours as needed for pain.      Home: Home Living Family/patient expects to be discharged to:: Private residence Living Arrangements: Children Available Help at Discharge: Family, Available PRN/intermittently (daughter and son-in-law work during the day) Type of Home: House Bathroom Shower/Tub: Chiropodist: [place]: Environmental consultant - 2 wheels, [employer] - quad, Wheelchair - manual   Functional History: Prior Function Level of Independence: Needs assistance Gait / Transfers Assistance Needed: Using RW and cane when needed ADL's / Homemaking Assistance Needed: Able to dress self, required assistance for bathing. Comments: Has been managing during the day at home while daughter and son-in-law at work; uses RW regularly  Functional Status:  Mobility: Bed Mobility Overal bed mobility: Needs Assistance Bed Mobility: Supine to Sit Supine to sit: +2 for physical assistance, Max assist, HOB elevated General bed mobility comments: assist at [place] and trunk to come into sitting with HOB elevated and use of bed pad; cues for sequencing and technique Transfers Overall transfer level: Needs assistance Equipment used: Rolling walker (2 wheeled), 2 person hand held assist Transfer via Lift Equipment: Stedy Transfers: Sit to/from [employer] to Stand: Max assist, +2 physical assistance, Mod assist, From elevated surface Stand pivot transfers: +2 physical assistance, Max assist General transfer comment: max A +2 with use of gait belt and bed pad from EOB and mod A +2 from [place] (elevated  surface); cues for hand placement and technique; pt able to increase tolerance to standing and L LE weight bearing second trial; worked on weight shifting and balance with min guard A and cues for posture X3 mins      ADL: ADL Overall ADL's : Needs assistance/impaired Eating/Feeding: Set up, Sitting Grooming: Set up, Sitting Upper Body Bathing: Minimal assistance, Sitting Lower Body Bathing: Maximal assistance, Bed level Upper Body Dressing : Minimal assistance, Sitting Lower Body Dressing: Maximal assistance, Bed level Toilet Transfer: Maximal assistance, Total assistance, +2 for physical assistance, Stand-pivot Toileting- Clothing Manipulation and Hygiene: Maximal assistance, Total assistance, +2 for physical assistance, Sit to/from stand General ADL Comments: Pt hesitant for movement. Able to sit at EOB for ADL tasks with min guard for safety. Educated on benefits of participating in own  self-care.  Cognition: Cognition Overall Cognitive Status: No family/caregiver present to determine baseline cognitive functioning Orientation Level: Oriented X4 Cognition Arousal/Alertness: Awake/alert Behavior During Therapy: WFL for tasks assessed/performed, Anxious Overall Cognitive Status: No family/caregiver present to determine baseline cognitive functioning Area of Impairment: Memory Memory: Decreased short-term memory General Comments: Pt frequently re-telling stories and confusing timeline of stay in hospital.     Blood pressure (!) 156/74, pulse 74, temperature 97.8 F (36.6 C), temperature source Oral, resp. rate 16, height 5\' 6"  (1.676 m), weight 90.3 kg (199 lb), SpO2 95 %. Physical Exam  Nursing note and vitals reviewed. Constitutional: She is oriented to person, place, and time. She appears well-developed and well-nourished. No distress.  Obese female. HOH--in mild discomfort due to hip pain and fatigue.  HENT:  Head: Normocephalic and atraumatic.  Mouth/Throat: Oropharynx is  clear and moist.  Eyes: Conjunctivae and EOM are normal. Pupils are equal, round, and reactive to light.  Neck: Normal range of motion. Neck supple.  Cardiovascular: Normal rate and regular rhythm.   Respiratory: Effort normal and breath sounds normal. No stridor. No respiratory distress. She has no wheezes.  GI: Soft. Bowel sounds are normal. There is tenderness (LLQ hernia tender to palpation.--soft ).  Well healed old abdominal incision with large midline hernia and smaller hernia at lower aspect--non tender to palpation.   Musculoskeletal: She exhibits edema and tenderness.  2+ edema left hip and 1+ edema at knee. Small incision with steri-strips in place.  LLE--pain with minimal attempts at ROM.    Neurological: She is alert and oriented to person, place, and time.  Motor: B/l deltoid, bicep, tricep, grip 5/5 RLE: 4-/5 hip flexor, knee extensor, ankle dorsal flexor LLE: 2/5 hip flexion (pain inhibition), ankle dorsiflexion/plantar flexion 3-/5  Skin: Skin is warm and dry. She is not diaphoretic.  Hip incision intact  Psychiatric: Her speech is normal and behavior is normal. Her mood appears anxious.    Results for orders placed or performed during the hospital encounter of 03/02/16 (from the past 48 hour(s))  Hemoglobin and hematocrit, blood     Status: Abnormal   Collection Time: 03/04/16  6:03 PM  Result Value Ref Range   Hemoglobin 9.2 (L) 12.0 - 15.0 g/dL   HCT 27.3 (L) 36.0 - 46.0 %  CBC     Status: Abnormal   Collection Time: 03/06/16  8:05 AM  Result Value Ref Range   WBC 4.1 4.0 - 10.5 K/uL   RBC 3.12 (L) 3.87 - 5.11 MIL/uL   Hemoglobin 9.2 (L) 12.0 - 15.0 g/dL   HCT 28.1 (L) 36.0 - 46.0 %   MCV 90.1 78.0 - 100.0 fL   MCH 29.5 26.0 - 34.0 pg   MCHC 32.7 30.0 - 36.0 g/dL   RDW 14.0 11.5 - 15.5 %   Platelets 136 (L) 150 - 400 K/uL   No results found.     Medical Problem List and Plan: 1.  Gait abnormality, difficulty with transfers, poor activity tolerance  secondary to left hip fracture. 2.  DVT Prophylaxis/Anticoagulation: Mechanical: Sequential compression devices, below knee Bilateral lower extremities Pharmaceutical: Lovenox 3. Pain Management: Will premedicate prior to therapy sessions. Add voltaren gel to left shoulder (worse due to pulling on bed). Sportscreme to back for chronic back pain. Will schedule tylenol qid. Likely needs muscle relaxers with extensive edema LLE.  4. Mood: Team to provide ego support. LCSW to follow for evaluation and support.  5. Neuropsych: This patient is capable of making decisions  on his own behalf. 6. Skin/Wound Care: Monitor wound daily for healing  7. Fluids/Electrolytes/Nutrition: Monitor I/O. Check lytes in am.  8.  ABLA: Add iron supplement. Check labs in am.  9. Abdominal pain/constipation: Will increase miralax to bid. Will check KUB.  10. HTN: Monitor bid--is on upward trend. Will resume medications slowly.  11. ? CKD: On HCTZ at home--BUN/Scr 28/1.22 at admission. Encourage fluid intake.  Recheck labs in am. may need diuretic resumed to help with LLE edema.   12. Thrombocytopenia: Likely reactive. Improving from 98--->136. Monitor for signs of bleeding. Will monitor left thigh daily.    Post Admission Physician Evaluation: 1. Preadmission assessment reviewed and changes made below. 2. Functional deficits secondary  to left hip fracture. 3. Patient is admitted to receive collaborative, interdisciplinary care between the physiatrist, rehab nursing staff, and therapy team. 4. Patient's level of medical complexity and substantial therapy needs in context of that medical necessity cannot be provided at a lesser intensity of care such as a SNF. 5. Patient has experienced substantial functional loss from his/her baseline which was documented above under the "Functional History" and "Functional Status" headings.  Judging by the patient's diagnosis, physical exam, and functional history, the patient has  potential for functional progress which will result in measurable gains while on inpatient rehab.  These gains will be of substantial and practical use upon discharge  in facilitating mobility and self-care at the household level. 6. Physiatrist will provide 24 hour management of medical needs as well as oversight of the therapy plan/treatment and provide guidance as appropriate regarding the interaction of the two. 7. The Preadmission Screening has been reviewed and patient status is unchanged unless otherwise stated above. 8. 24 hour rehab nursing will assist with bladder management, bowel management, safety, skin/wound care, pain management and patient education  and help integrate therapy concepts, techniques,education, etc. 9. PT will assess and treat for/with: Lower extremity strength, range of motion, stamina, balance, functional mobility, safety, adaptive techniques and equipment, woundcare, coping skills, pain control, education.   Goals are: Min/Mod A. 10. OT will assess and treat for/with: ADL's, functional mobility, safety, upper extremity strength, adaptive techniques and equipment, wound mgt, ego support, and community reintegration.   Goals are: Min/Mod A. Therapy may proceed with showering this patient. 11. Case Management and Social Worker will assess and treat for psychological issues and discharge planning. 12. Team conference will be held weekly to assess progress toward goals and to determine barriers to discharge. 13. Patient will receive at least 3 hours of therapy per day at least 5 days per week. 14. ELOS: 14-18 days.       15. Prognosis:  good   Delice Lesch, MD, [address], [place] 03/06/2016

## 2016-03-07 NOTE — Progress Notes (Signed)
[facility] PHYSICAL MEDICINE & REHABILITATION     PROGRESS NOTE  Subjective/Complaints: Pt seen sitting up in bed this AM.  She slept well.  She asks for some things to be rearranged around her bed.   ROS: Denies CP, SOB, N/V/D.  Objective: Vital Signs: Blood pressure (!) 156/61, pulse 62, temperature 98.1 F (36.7 C), temperature source Oral, resp. rate 18, height 5\' 6"  (1.676 m), weight 93.2 kg (205 lb 7.5 oz), SpO2 95 %. No results found.  Recent Labs  03/06/16 0805 03/07/16 0725  WBC 4.1 3.5*  HGB 9.2* 8.8*  HCT 28.1* 27.5*  PLT 136* 164    Recent Labs  03/07/16 0725  NA 140  K 4.8  CL 109  GLUCOSE 112*  BUN 23*  CREATININE 0.82  CALCIUM 9.9   CBG (last 3)  No results for input(s): GLUCAP in the last 72 hours.  Wt Readings from Last 3 Encounters:  03/06/16 93.2 kg (205 lb 7.5 oz)  03/03/16 90.3 kg (199 lb)  01/15/12 87.4 kg (192 lb 9.6 oz)    Physical Exam:  BP (!) 156/61   Pulse 62   Temp 98.1 F (36.7 C) (Oral)   Resp 18   Ht 5\' 6"  (1.676 m)   Wt 93.2 kg (205 lb 7.5 oz)   SpO2 95%   BMI 33.16 kg/m  Constitutional: She appears well-developed. No distress. Obese female.  HENT: Normocephalic and atraumatic.  Eyes: EOM are normal. No discharge.  Cardiovascular: Normal rate and regular rhythm. No JVD. Respiratory: Effort normal and breath sounds normal.  GI: Soft. Bowel sounds are normal. .  Well healed old abdominal incision with large midline hernia and smaller hernia at lower aspect.   Musculoskeletal: She exhibits edema and tenderness.  Neurological: She is alert and oriented.  Motor: B/l deltoid, bicep, tricep, grip 5/5 RLE: 4-/5 hip flexor, knee extensor, ankle dorsal flexor LLE: 2/5 hip flexion (pain inhibition), ankle dorsiflexion/plantar flexion 3-/5  Skin: Skin is warm and dry. She is not diaphoretic.  Hip incision with dressing, c/d/i Psychiatric: Her speech is normal and behavior is normal.   Assessment/Plan: 1. Functional deficits  secondary to left hip fracture which require 3+ hours per day of interdisciplinary therapy in a comprehensive inpatient rehab setting. Physiatrist is providing close team supervision and 24 hour management of active medical problems listed below. Physiatrist and rehab team continue to assess barriers to discharge/monitor patient progress toward functional and medical goals.  Function:  Bathing Bathing position      Bathing parts      Bathing assist        Upper Body Dressing/Undressing Upper body dressing                    Upper body assist        Lower Body Dressing/Undressing Lower body dressing                                  Lower body assist        Toileting Toileting          Toileting assist     Transfers Chair/bed transfer             Locomotion Ambulation           Wheelchair          Cognition Comprehension Comprehension assist level: Follows basic conversation/direction with no assist  Expression Expression  assist level: Expresses basic needs/ideas: With no assist  Social Interaction Social Interaction assist level: Interacts appropriately with others - No medications needed.  Problem Solving Problem solving assist level: Solves basic problems with no assist  Memory Memory assist level: Complete Independence: No helper    Medical Problem List and Plan: 1.  Gait abnormality, difficulty with transfers, poor activity tolerance secondary to left hip fracture.  Begin CIR 2.  DVT Prophylaxis/Anticoagulation: Mechanical: Sequential compression devices, below knee Bilateral lower extremities Pharmaceutical: Lovenox  Vascular study pending 3. Pain Management: Premedicate prior to therapy sessions.   Added voltaren gel to left shoulder   Sportscreme to back for chronic back pain.    Scheduled tylenol qid.   Robaxin PRN  4. Mood: Team to provide ego support. LCSW to follow for evaluation and support.  5. Neuropsych: This  patient is capable of making decisions on his own behalf. 6. Skin/Wound Care: Monitor wound daily for healing  7. Fluids/Electrolytes/Nutrition: Monitor I/O.   BMP within acceptable range on 12/23 8.  ABLA: Added iron supplement.   Hb 8.8 on 12/23  Cont to monitor 9. Abdominal pain/constipation:   KUB reviewed 12/23, unremarkable   Increased miralax to bid.  10. HTN: Monitor bid  PTA Norcasc 5, HCTZ 25  Cont Toprolol 12.5  HCTZ 12.5 started 12/23  Will monitor with increased activity 11. ?CKD: On HCTZ at home--BUN/Scr 28/1.22 at admission. Encourage fluid intake.    Cr. 0.82 on 12/23  Will cont to monitor 12. Thrombocytopenia: Resolved  Likely reactive.   Plts 164 on 12/23 13. Hypoalbuminemia  Supplement initiated 12/23 14. Leukopenia   WBCs 3.5 on 12/23  Will cont to monitor  LOS (Days) 1 A FACE TO FACE EVALUATION WAS PERFORMED  Versa Craton Lorie Phenix 03/07/2016 11:23 AM
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
## 2016-03-07 NOTE — Progress Notes (Signed)
*  PRELIMINARY RESULTS* Vascular Ultrasound BIlateral lower extremity venous duplex has been completed.  Preliminary findings: No evidence of deep vein thrombosis in the visualized veins of the lower extremity. Unable to compress left popliteal vein due to patient tolerance.  Negative for baker's cysts bilaterally.   Everrett Coombe 03/07/2016, 9:51 AM
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
## 2016-03-08 ENCOUNTER — Inpatient Hospital Stay (HOSPITAL_COMMUNITY): Payer: Medicare Other | Admitting: Physical Therapy

## 2016-03-08 ENCOUNTER — Inpatient Hospital Stay (HOSPITAL_COMMUNITY): Payer: Medicare Other | Admitting: Occupational Therapy

## 2016-03-08 DIAGNOSIS — M542 Cervicalgia: Secondary | ICD-10-CM

## 2016-03-08 DIAGNOSIS — R609 Edema, unspecified: Secondary | ICD-10-CM

## 2016-03-08 DIAGNOSIS — R52 Pain, unspecified: Secondary | ICD-10-CM

## 2016-03-08 DIAGNOSIS — Z853 Personal history of malignant neoplasm of breast: Secondary | ICD-10-CM
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
## 2016-03-08 NOTE — Progress Notes (Addendum)
Occupational Therapy Session Note  Patient Details  Name: [name] MRN: [id number] Date of Birth: 1926-11-08  Today's Date: 03/08/2016 OT Individual Time: 1415-1530 OT Individual Time Calculation (min): 75 min     Short Term Goals: Week 1:  OT Short Term Goal 1 (Week 1): Pt will complete toilet transfer and 1 helper with LRAD OT Short Term Goal 2 (Week 1): Pt will complete bathing with Min A and AE as needed OT Short Term Goal 3 (Week 1): Pt will complete LB dressing with Max A and AE as needed OT Short Term Goal 4 (Week 1): Pt will complete toileting tasks with 1 helper      Skilled Therapeutic Interventions/Progress Updates:   Pt was received via PT handoff, reported feeling very fatigued but willing to participate in therapy. Crossword activity was set up with plans to use Stedy lift for increasing standing tolerance. Pt attempted to stand x6 times with extra time and Stedy, c/o left shoulder pain increased with unsuccessful power ups and pt then became tearful, therefore attempts ceased. Pt was agreeable to complete w/c level therapy.  Pt self propelled partway to family room with extra time to accommodate shoulder pain. She was pushed for remainder of distance. Simple meal prep/coffee prep activity completed w/c level for activity tolerance and w/c safety. Education on pain mgt/coping strategies provided. Pt was also provided therapeutic listening when speaking of former hospital/rehab course and becoming tearful of recalling past events. Pt provided support and encouragement, educated on goals and OT POC in order to go home with family. Pt verbalized understanding. Afterwards she was taken back to room. Attempted to stand with 1 helper and Stedy lift but pt very limited with providing assistance due to left shoulder pain. Pt completed sit<stand in [place] with 2 helpers and was transferred back to bed. After pt was repositioned for comfort, ice pack was applied to left shoulder. Pt  left in bed with all needs within reach at time of departure.   Therapy Documentation Precautions:  Precautions Precautions: Fall Precaution Comments: Premedicate for pain Restrictions Weight Bearing Restrictions: No LLE Weight Bearing: Weight bearing as tolerated  Pain: Pt provided rest breaks throughout session and ice pack to left shoulder afterwards. Pt was not due for pain medication during tx per RN report Pain Assessment Pain Assessment: 0-10 Pain Score: 6  Pain Type: Acute pain Pain Location: Shoulder Pain Orientation: Left Pain Descriptors / Indicators: Aching Pain Frequency: Constant Pain Onset: With Activity Pain Intervention(s): Medication (See eMAR) ADL: ADL ADL Comments: Please see functional navigator for ADL status    See Function Navigator for Current Functional Status.   Therapy/Group: Individual Therapy  Allyah Heather A Kamrynn Melott 03/08/2016, 4:55 PM

## 2016-03-08 NOTE — Progress Notes (Signed)
Physical Therapy Session Note  Patient Details  Name: [name] MRN: [id number] Date of Birth: 1927-02-18  Today's Date: 03/08/2016 PT Individual Time: 1100-1200 PT Individual Time Calculation (min): 60 min    Short Term Goals: Week 1:  PT Short Term Goal 1 (Week 1): Pt will perform bed mobility modA PT Short Term Goal 2 (Week 1): Pt will perform sit <>stand modA +1 PT Short Term Goal 3 (Week 1): Pt will perform stand pivot transfer maxA PT Short Term Goal 4 (Week 1): Pt will initiate gait training  Skilled Therapeutic Interventions/Progress Updates:   Pt received supine in bed, c/o pain as below and agreeable to treatment. Bed mobility modA for LE management and d/t complaints of L shoulder pain; RN alerted to pain. Stand pivot transfers in stedy x3 during session bed>w/c and w/c <>BSC. Standing in parallel bars pt performs LLE marching; unable to perform on RLE due to pain in LLE in stance. Weight shifting R/L 2x10 reps to increase weight bearing through LLE. Pt became labile and was talking about all her previous health problems and loss of family members; offered emotional support. Transferred to [facility] as above; required [place] for hygiene. Remained seated in w/c at end of session, all needs in reach and ice pack on L shoulder.   Therapy Documentation Precautions:  Precautions Precautions: Fall Precaution Comments: Premedicate for pain Restrictions Weight Bearing Restrictions: No LLE Weight Bearing: Weight bearing as tolerated Pain: Pain Assessment Pain Assessment: 0-10 Pain Score: 5  Pain Type: Acute pain Pain Location: Leg Pain Orientation: Left Pain Descriptors / Indicators: Aching Pain Frequency: Constant Pain Onset: With Activity Pain Intervention(s): Medication (See eMAR)   See Function Navigator for Current Functional Status.   Therapy/Group: Individual Therapy  Luberta Mutter 03/08/2016, 12:09 PM

## 2016-03-08 NOTE — Progress Notes (Signed)
Physical Therapy Session Note  Patient Details  Name: [name] MRN: [id number] Date of Birth: 26-Jul-1926  Today's Date: 03/08/2016 PT Individual Time: 1330-1415 PT Individual Time Calculation (min): 45 min    Short Term Goals: Week 1:  PT Short Term Goal 1 (Week 1): Pt will perform bed mobility modA PT Short Term Goal 2 (Week 1): Pt will perform sit <>stand modA +1 PT Short Term Goal 3 (Week 1): Pt will perform stand pivot transfer maxA PT Short Term Goal 4 (Week 1): Pt will initiate gait training    Therapy Documentation Precautions:  Precautions Precautions: Fall Precaution Comments: Premedicate for pain Restrictions Weight Bearing Restrictions: No LLE Weight Bearing: Weight bearing as tolerated   Pain: Pain Assessment Pain Assessment: 0-10 Pain Score: 6  Pain Type: Acute pain Pain Location: Shoulder Pain Orientation: Left Pain Descriptors / Indicators: Aching Pain Frequency: Constant Pain Onset: With Activity Pain Intervention(s): Medication (See eMAR)   Sit to and from stand transfer mod assist with RW  Patient stood two trials for 2 minutes each with RW min assist. Emphasis placed on endurance, strengthening, and tolerance to upright activity.   Patient ambulated with RW mod assist and close wheelchair follow 15 feet. Patient ambulated with an antalgic gait.   Seated there ex: B LAQ 3x10 B hip flexion 3x10  Patient provided and fit with a left elevating leg rest to improve positioning of LE and increase sitting tolerance.  Patient required frequent and prolonged rest breaks throughout session. Patient handed off directly to [place] at end of session.   See Function Navigator for Current Functional Status.   Therapy/Group: Individual Therapy  Retta Diones 03/08/2016, 3:18 PM

## 2016-03-09 ENCOUNTER — Inpatient Hospital Stay (HOSPITAL_COMMUNITY): Payer: Medicare Other

## 2016-03-09 DIAGNOSIS — R600 Localized edema: Secondary | ICD-10-CM

## 2016-03-09 LAB — CBC WITH DIFFERENTIAL/PLATELET
BASOS ABS: 0 10*3/uL (ref 0.0–0.1)
Basophils Relative: 1 %
Eosinophils Absolute: 0.3 10*3/uL (ref 0.0–0.7)
Eosinophils Relative: 8 %
HEMATOCRIT: 27.4 % — AB (ref 36.0–46.0)
HEMOGLOBIN: 8.5 g/dL — AB (ref 12.0–15.0)
Lymphocytes Relative: 25 %
Lymphs Abs: 0.9 10*3/uL (ref 0.7–4.0)
MCH: 29.1 pg (ref 26.0–34.0)
MCHC: 31 g/dL (ref 30.0–36.0)
MCV: 93.8 fL (ref 78.0–100.0)
Monocytes Absolute: 0.4 10*3/uL (ref 0.1–1.0)
Monocytes Relative: 10 %
NEUTROS ABS: 2.1 10*3/uL (ref 1.7–7.7)
NEUTROS PCT: 56 %
Platelets: 190 10*3/uL (ref 150–400)
RBC: 2.92 MIL/uL — AB (ref 3.87–5.11)
RDW: 14.4 % (ref 11.5–15.5)
WBC: 3.7 10*3/uL — AB (ref 4.0–10.5)

## 2016-03-09 LAB — BASIC METABOLIC PANEL
ANION GAP: 3 — AB (ref 5–15)
BUN: 31 mg/dL — ABNORMAL HIGH (ref 6–20)
CALCIUM: 10.1 mg/dL (ref 8.9–10.3)
CHLORIDE: 105 mmol/L (ref 101–111)
CO2: 32 mmol/L (ref 22–32)
Creatinine, Ser: 0.95 mg/dL (ref 0.44–1.00)
GFR calc non Af Amer: 52 mL/min — ABNORMAL LOW (ref >60)
Glucose, Bld: 100 mg/dL — ABNORMAL HIGH (ref 65–99)
Potassium: 4.3 mmol/L (ref 3.5–5.1)
Sodium: 140 mmol/L (ref 135–145)

## 2016-03-09 NOTE — Progress Notes (Signed)
[place] PHYSICAL MEDICINE & REHABILITATION     PROGRESS NOTE  Subjective/Complaints: Pt seen laying in bed this AM.  She slept well overnight.  She notes edema due the base of her left neck.  She states she has been using that side more and has been trying to work it out.  ROS: +Left neck sore. Denies CP, SOB, N/V/D.  Objective: Vital Signs: Blood pressure (!) 147/65, pulse 65, temperature 98 F (36.7 C), temperature source Oral, resp. rate 18, height 5\' 6"  (1.676 m), weight 93.2 kg (205 lb 7.5 oz), SpO2 95 %. No results found.  Recent Labs  03/07/16 0725 03/09/16 0537  WBC 3.5* 3.7*  HGB 8.8* 8.5*  HCT 27.5* 27.4*  PLT 164 190    Recent Labs  03/07/16 0725 03/09/16 0537  NA 140 140  K 4.8 4.3  CL 109 105  GLUCOSE 112* 100*  BUN 23* 31*  CREATININE 0.82 0.95  CALCIUM 9.9 10.1   CBG (last 3)  No results for input(s): GLUCAP in the last 72 hours.  Wt Readings from Last 3 Encounters:  03/06/16 93.2 kg (205 lb 7.5 oz)  03/03/16 90.3 kg (199 lb)  01/15/12 87.4 kg (192 lb 9.6 oz)    Physical Exam:  BP (!) 147/65 (BP Location: Right Arm)   Pulse 65   Temp 98 F (36.7 C) (Oral)   Resp 18   Ht 5\' 6"  (1.676 m)   Wt 93.2 kg (205 lb 7.5 oz)   SpO2 95%   BMI 33.16 kg/m  Constitutional: She appears well-developed. No distress. Obese.  HENT: Normocephalic and atraumatic.  Eyes: EOM are normal. No discharge.  Cardiovascular: RRR. No JVD. Respiratory: Effort normal and breath sounds normal.  GI: Soft. Bowel sounds are normal. .  Well healed old abdominal incision with large midline hernia and smaller hernia at lower aspect.   Musculoskeletal: She exhibits edema and tenderness, now also at left neck base. Neurological: She is alert and oriented.  Motor: B/l deltoid, bicep, tricep, grip 5/5 RLE: 4-/5 hip flexor, knee extensor, ankle dorsal flexor LLE: 2+/5 hip flexion (pain inhibition), ankle dorsiflexion/plantar flexion 4+/5 (stable) Skin: Skin is warm and dry.  She is not diaphoretic.  Hip incision with dressing c/d/i Psychiatric: Her speech is normal and behavior is normal.   Assessment/Plan: 1. Functional deficits secondary to left hip fracture which require 3+ hours per day of interdisciplinary therapy in a comprehensive inpatient rehab setting. Physiatrist is providing close team supervision and 24 hour management of active medical problems listed below. Physiatrist and rehab team continue to assess barriers to discharge/monitor patient progress toward functional and medical goals.  Function:  Bathing Bathing position   Position: Sitting EOB  Bathing parts Body parts bathed by patient: Right arm, Left arm, Chest, Abdomen, Right upper leg, Left upper leg Body parts bathed by helper: Front perineal area, Buttocks, Right lower leg, Left lower leg, Back  Bathing assist Assist Level: 2 helpers      Upper Body Dressing/Undressing Upper body dressing   What is the patient wearing?: Hospital gown                Upper body assist Assist Level: Set up      Lower Body Dressing/Undressing Lower body dressing   What is the patient wearing?: Ted Hose, Non-skid slipper socks           Non-skid slipper socks- Performed by helper: Don/doff right sock, Don/doff left sock  TED Hose - Performed by helper: Don/doff right TED hose, Don/doff left TED hose  Lower body assist Assist for lower body dressing:  (Max A)      Toileting Toileting     Toileting steps completed by helper: Adjust clothing prior to toileting, Performs perineal hygiene, Adjust clothing after toileting Toileting Assistive Devices: Grab bar or rail  Toileting assist Assist level: Two helpers   Transfers Chair/bed transfer   Chair/bed transfer method: Stand pivot Chair/bed transfer assist level: 2 helpers Chair/bed transfer assistive device: Mechanical lift Mechanical lift: Ecologist Ambulation activity did not occur:  Safety/medical concerns   Max distance: 15 feet     Wheelchair   Type: Manual Max wheelchair distance: 75 Assist Level: Supervision or verbal cues  Cognition Comprehension Comprehension assist level: Follows basic conversation/direction with no assist  Expression Expression assist level: Expresses basic needs/ideas: With no assist  Social Interaction Social Interaction assist level: Interacts appropriately with others - No medications needed.  Problem Solving Problem solving assist level: Solves basic problems with no assist  Memory Memory assist level: Complete Independence: No helper    Medical Problem List and Plan: 1.  Gait abnormality, difficulty with transfers, poor activity tolerance secondary to left hip fracture.  Cont CIR 2.  DVT Prophylaxis/Anticoagulation: Mechanical: Sequential compression devices, below knee Bilateral lower extremities Pharmaceutical: Lovenox  Vascular study neg and limited on 12/23 for DVT 3. Pain Management: Premedicate prior to therapy sessions.   Added voltaren gel to left shoulder   Sportscreme to back for chronic back pain.    Scheduled tylenol qid.   Robaxin PRN  4. Mood: Team to provide ego support. LCSW to follow for evaluation and support.  5. Neuropsych: This patient is capable of making decisions on his own behalf. 6. Skin/Wound Care: Monitor wound daily for healing  7. Fluids/Electrolytes/Nutrition: Monitor I/O.   BMP within acceptable range on 12/25 8.  ABLA: Added iron supplement.   Hb 8.5 on 12/25  Cont to monitor 9. Abdominal pain/constipation:   KUB reviewed 12/23, unremarkable   Increased miralax to bid.  10. HTN: Monitor bid  PTA Norcasc 5, HCTZ 25  Cont Toprolol 12.5  HCTZ 12.5 started 12/23  Labile, but relatively controlled 12/25  Will monitor with increased activity 11. ?CKD: On HCTZ at home--BUN/Scr 28/1.22 at admission. Encourage fluid intake.    Cr. 0.95 on 12/25  Encourage fluid intake  Will cont to  monitor 12. Thrombocytopenia: Resolved  Likely reactive.   Plts 190 on 12/25 13. Hypoalbuminemia  Supplement initiated 12/23 14. Leukopenia   WBCs 3.7 on 12/25  Will cont to monitor 15. Left neck edema and pain with history of breast CA and mastectomy  U/S pending  LOS (Days) 3 A FACE TO FACE EVALUATION WAS PERFORMED  [name] Lorie Phenix 03/09/2016 9:40 AM

## 2016-03-10 ENCOUNTER — Inpatient Hospital Stay (HOSPITAL_COMMUNITY): Payer: Medicare Other | Admitting: Occupational Therapy

## 2016-03-10 ENCOUNTER — Inpatient Hospital Stay (HOSPITAL_COMMUNITY): Payer: Medicare Other | Admitting: Physical Therapy

## 2016-03-10 DIAGNOSIS — S72009A Fracture of unspecified part of neck of unspecified femur, initial encounter for closed fracture: Secondary | ICD-10-CM

## 2016-03-10 LAB — BASIC METABOLIC PANEL
ANION GAP: 8 (ref 5–15)
BUN: 28 mg/dL — ABNORMAL HIGH (ref 6–20)
CO2: 29 mmol/L (ref 22–32)
Calcium: 10.2 mg/dL (ref 8.9–10.3)
Chloride: 102 mmol/L (ref 101–111)
Creatinine, Ser: 1 mg/dL (ref 0.44–1.00)
GFR, EST AFRICAN AMERICAN: 57 mL/min — AB (ref >60)
GFR, EST NON AFRICAN AMERICAN: 49 mL/min — AB (ref >60)
Glucose, Bld: 107 mg/dL — ABNORMAL HIGH (ref 65–99)
POTASSIUM: 4.5 mmol/L (ref 3.5–5.1)
SODIUM: 139 mmol/L (ref 135–145)

## 2016-03-10 NOTE — Plan of Care (Signed)
Problem: RH BOWEL ELIMINATION Goal: RH STG MANAGE BOWEL WITH ASSISTANCE STG Manage Bowel with Assistance. Mod I  Outcome: Progressing LBM 03/09/16

## 2016-03-10 NOTE — Progress Notes (Addendum)
Occupational Therapy Session Note  Patient Details  Name: [name] MRN: [id number] Date of Birth: 1926-03-20  Today's Date: 03/10/2016  Session 1 OT Individual Time: 0900-1000 OT Individual Time Calculation (min): 60 min   Session 2 OT Individual Time: 1330-1400 OT Individual Time Calculation (min): 30 min   Short Term Goals:Week 1:  OT Short Term Goal 1 (Week 1): Pt will complete toilet transfer and 1 helper with LRAD OT Short Term Goal 2 (Week 1): Pt will complete bathing with Min A and AE as needed OT Short Term Goal 3 (Week 1): Pt will complete LB dressing with Max A and AE as needed OT Short Term Goal 4 (Week 1): Pt will complete toileting tasks with 1 helper  Skilled Therapeutic Interventions/Progress Updates:  Session 1   1:1 OT session focused on modified bathing/dressing, activity tolerance, and functional transfers. Pt greeted in bed and reported need for bed pan- encouraged pt to transfer onto [facility] but pt declined 2/2 urgency. Mod A to roll for bedpan placement. Pt voided bladder successfully, but no Bm. Bathing/dressing completed at EOB with total A for LB dressing and Max A for UB dressing 2/2 painful L shoulder. Increased pain with shoulder abduction and internal rotation.  Stedy used for sit<>stands to pull pants over hips w/ Max A, then Stedy used to transfer pt to w/c. Pt left seated in wc at end of session with needs met.   Session 2 1:1 OT session focused on transfer training, sit<>stand, and toileting. Sit<>stands Max A, + Max  A stand-pivot >BSC + bladder. Educated on modified strategy for toileting, pt attempted technique but experienced increased L hip pain and required Max A to toilet for thoroughness. Stedy used for transfer from [employer] 2/2 pain and fatigue. Pt left semi-reclined in bed with needs met.  Therapy Documentation Precautions:  Precautions Precautions: Fall Precaution Comments: Premedicate for pain Restrictions Weight Bearing  Restrictions: Yes LLE Weight Bearing: Weight bearing as tolerated Pain: Pain Assessment Pain Assessment: 0-10 Pain Score: 5  Pain Type: Acute pain Pain Location: Shoulder Pain Orientation: Left Pain Descriptors / Indicators: Aching Pain Onset: On-going Pain Intervention(s): Repositioned ADL: ADL ADL Comments: Please see functional navigator for ADL status  See Function Navigator for Current Functional Status.   Therapy/Group: Individual Therapy  [name] 03/10/2016, 3:59 PM

## 2016-03-10 NOTE — Plan of Care (Signed)
Problem: RH PAIN MANAGEMENT Goal: RH STG PAIN MANAGED AT OR BELOW PT'S PAIN GOAL <5  Outcome: Progressing Tylenol,Percocet

## 2016-03-10 NOTE — Progress Notes (Signed)
Physical Therapy Session Note  Patient Details  Name: [name] MRN: [id number] Date of Birth: 08/09/1926  Today's Date: 03/10/2016 PT Individual Time: 1100-1200 PT Individual Time Calculation (min): 60 min    Short Term Goals: Week 1:  PT Short Term Goal 1 (Week 1): Pt will perform bed mobility modA PT Short Term Goal 2 (Week 1): Pt will perform sit <>stand modA +1 PT Short Term Goal 3 (Week 1): Pt will perform stand pivot transfer maxA PT Short Term Goal 4 (Week 1): Pt will initiate gait training  Skilled Therapeutic Interventions/Progress Updates:    Patient in wheelchair upon arrival reporting L shoulder/collar bone pain. Instructed in stand pivot transfer wheelchair > mat table using RW with max A overall and cues for sequencing and technique and increased difficulty moving RLE due to pain with increased WB LLE. Provided hot pack for L shoulder with pt reporting some improvement, PA-C notified and to order k-pad. Seated BLE therex for strengthening and ROM: hip flexion, knee extension, ankle pumps, 2 sets of 15 reps each exercise. Patient performed sit <> stand transfers from mat table and wheelchair with max A and cues to push up/lower down with RUE but patient continues to use LUE out of habit per report despite increased pain with use of LUE. Patient tolerated standing with bilateral, single, and no UE support on RW x 6 minutes with supervision while completing tabletop card matching task. Patient reporting increased weakness/fatigue and pivoted to wheelchair using RW with min A. Patient propelled wheelchair using RUE only due to LUE pain and B feet not reaching floor with max A and cues for technique x 75 ft with greatly increased time. Patient declined ambulation this date due to c/o weakness and feeling sick on her stomach. Patient left sitting in wheelchair with needs in reach and set up for lunch meal.   Therapy Documentation Precautions:  Precautions Precautions:  Fall Precaution Comments: Premedicate for pain Restrictions Weight Bearing Restrictions: Yes LLE Weight Bearing: Weight bearing as tolerated Pain: Pain Assessment Pain Assessment: 0-10 Pain Score: 7  Pain Type: Acute pain Pain Location: Shoulder (collar bone?) Pain Orientation: Left Pain Descriptors / Indicators: Aching Pain Onset: On-going Pain Intervention(s): Repositioned;Heat applied;Rest   See Function Navigator for Current Functional Status.   Therapy/Group: Individual Therapy  Shenea Giacobbe, Murray Hodgkins 03/10/2016, 12:12 PM
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
## 2016-03-10 NOTE — Progress Notes (Addendum)
[facility] PHYSICAL MEDICINE & REHABILITATION     PROGRESS NOTE  Subjective/Complaints: Pt seen laying in bed this AM.  She slept well overnight.  She states her neck is a little better.  She needs to use the restroom.  ROS: +Left neck sore (improved). Denies CP, SOB, N/V/D.  Objective: Vital Signs: Blood pressure 138/64, pulse 68, temperature 98.3 F (36.8 C), temperature source Oral, resp. rate 18, height 5\' 6"  (1.676 m), weight 93.2 kg (205 lb 7.5 oz), SpO2 96 %. US Soft Tissue Neck  Result Date: 03/09/2016 CLINICAL DATA:  Left neck mass EXAM: ULTRASOUND OF HEAD/NECK SOFT TISSUES TECHNIQUE: Ultrasound examination of the head and neck soft tissues was performed in the area of clinical concern. COMPARISON:  None. FINDINGS: There is no evidence of soft tissue mass or adenopathy in the left neck. IMPRESSION: No evidence of mass or adenopathy in the left neck. Electronically Signed   By: Marybelle Killings M.D.   On: 03/09/2016 15:11    Recent Labs  03/09/16 0537  WBC 3.7*  HGB 8.5*  HCT 27.4*  PLT 190    Recent Labs  03/09/16 0537 03/10/16 0442  NA 140 139  K 4.3 4.5  CL 105 102  GLUCOSE 100* 107*  BUN 31* 28*  CREATININE 0.95 1.00  CALCIUM 10.1 10.2   CBG (last 3)  No results for input(s): GLUCAP in the last 72 hours.  Wt Readings from Last 3 Encounters:  03/06/16 93.2 kg (205 lb 7.5 oz)  03/03/16 90.3 kg (199 lb)  01/15/12 87.4 kg (192 lb 9.6 oz)    Physical Exam:  BP 138/64 (BP Location: Right Arm)   Pulse 68   Temp 98.3 F (36.8 C) (Oral)   Resp 18   Ht 5\' 6"  (1.676 m)   Wt 93.2 kg (205 lb 7.5 oz)   SpO2 96%   BMI 33.16 kg/m  Constitutional: She appears well-developed. No distress. Obese.  HENT: Normocephalic and atraumatic.  Eyes: EOM are normal. No discharge.  Cardiovascular: RRR. No JVD. Respiratory: Effort normal and breath sounds normal.  GI: Soft. Bowel sounds are normal. .  Well healed old abdominal incision with large midline hernia and smaller  hernia at lower aspect.   Musculoskeletal: She exhibits edema and tenderness. Neurological: She is alert and oriented.  Motor: B/l deltoid, bicep, tricep, grip 5/5 RLE: 4-/5 hip flexor, knee extensor, ankle dorsal flexor LLE: 2+/5 hip flexion (pain inhibition, slightly improved), ankle dorsiflexion/plantar flexion 4+/5  Skin: Skin is warm and dry. She is not diaphoretic.  Hip incision with dressing c/d/i Psychiatric: Her speech is normal and behavior is normal.   Assessment/Plan: 1. Functional deficits secondary to left hip fracture which require 3+ hours per day of interdisciplinary therapy in a comprehensive inpatient rehab setting. Physiatrist is providing close team supervision and 24 hour management of active medical problems listed below. Physiatrist and rehab team continue to assess barriers to discharge/monitor patient progress toward functional and medical goals.  Function:  Bathing Bathing position   Position: Sitting EOB  Bathing parts Body parts bathed by patient: Right arm, Left arm, Chest, Abdomen, Right upper leg, Left upper leg Body parts bathed by helper: Front perineal area, Buttocks, Right lower leg, Left lower leg, Back  Bathing assist Assist Level: 2 helpers      Upper Body Dressing/Undressing Upper body dressing   What is the patient wearing?: Hospital gown                Upper body  assist Assist Level: Set up      Lower Body Dressing/Undressing Lower body dressing   What is the patient wearing?: Ted Hose, Non-skid slipper socks           Non-skid slipper socks- Performed by helper: Don/doff right sock, Don/doff left sock               TED Hose - Performed by helper: Don/doff right TED hose, Don/doff left TED hose  Lower body assist Assist for lower body dressing:  (Max A)      Toileting Toileting     Toileting steps completed by helper: Adjust clothing prior to toileting, Performs perineal hygiene, Adjust clothing after  toileting Toileting Assistive Devices: Grab bar or rail  Toileting assist Assist level: Two helpers   Transfers Chair/bed transfer   Chair/bed transfer method: Stand pivot Chair/bed transfer assist level: 2 helpers Chair/bed transfer assistive device: Mechanical lift Mechanical lift: Ecologist Ambulation activity did not occur: Safety/medical concerns   Max distance: 15 feet     Wheelchair   Type: Manual Max wheelchair distance: 75 Assist Level: Supervision or verbal cues  Cognition Comprehension Comprehension assist level: Follows basic conversation/direction with no assist  Expression Expression assist level: Expresses basic needs/ideas: With no assist  Social Interaction Social Interaction assist level: Interacts appropriately with others with medication or extra time (anti-anxiety, antidepressant).  Problem Solving Problem solving assist level: Solves basic 90% of the time/requires cueing < 10% of the time  Memory Memory assist level: Complete Independence: No helper    Medical Problem List and Plan: 1.  Gait abnormality, difficulty with transfers, poor activity tolerance secondary to left hip fracture.  Cont CIR 2.  DVT Prophylaxis/Anticoagulation: Mechanical: Sequential compression devices, below knee Bilateral lower extremities Pharmaceutical: Lovenox  Vascular study neg and limited on 12/23 for DVT 3. Pain Management: Premedicate prior to therapy sessions.   Added voltaren gel to left shoulder   Sportscreme to back for chronic back pain.    Scheduled tylenol qid.   Robaxin PRN  4. Mood: Team to provide ego support. LCSW to follow for evaluation and support.  5. Neuropsych: This patient is capable of making decisions on his own behalf. 6. Skin/Wound Care: Monitor wound daily for healing  7. Fluids/Electrolytes/Nutrition: Monitor I/O.   BMP within acceptable range on 12/26  Encourage fluid intake 8.  ABLA: Added iron supplement.   Hb 8.5 on  12/25  Labs ordered for tomorrow  Cont to monitor 9. Abdominal pain/constipation:   KUB reviewed 12/23, unremarkable   Increased miralax to bid.  10. HTN: Monitor bid  PTA Norcasc 5, HCTZ 25  Cont Toprolol 12.5  HCTZ 12.5 started 12/23  Controlled 12/26  Will monitor with increased activity 11. ?CKD: On HCTZ at home--BUN/Scr 28/1.22 at admission. Encourage fluid intake.    Cr. 1.00 on 12/26  Encourage fluid intake  Labs ordered for tomorrow  Will cont to monitor 12. Thrombocytopenia: Resolved  Likely reactive.   Plts 190 on 12/25  Labs ordered for tomorrow 13. Hypoalbuminemia  Supplement initiated 12/23 14. Leukopenia   WBCs 3.7 on 12/25  Labs ordered for tomorrow  Will cont to monitor 15. Left neck pain with history of breast CA and mastectomy  U/S negative  Voltaren gel ordered  LOS (Days) 4 A FACE TO FACE EVALUATION WAS PERFORMED  Indira Sorenson Lorie Phenix 03/10/2016 9:32 AM

## 2016-03-10 NOTE — Progress Notes (Signed)
Physical Therapy Session Note  Patient Details  Name: [name] MRN: [id number] Date of Birth: [birth year]  Today's Date: 03/10/2016 PT Individual Time: [id number] PT Individual Time Calculation (min): 57 min    Short Term Goals: Week 1:  PT Short Term Goal 1 (Week 1): Pt will perform bed mobility modA PT Short Term Goal 2 (Week 1): Pt will perform sit <>stand modA +1 PT Short Term Goal 3 (Week 1): Pt will perform stand pivot transfer maxA PT Short Term Goal 4 (Week 1): Pt will initiate gait training  Skilled Therapeutic Interventions/Progress Updates:    Pt received in bed & agreeable to tx, pt noted 9/10 pain in L shoulder/neck & LLE with movement/weight bearing. RN notified. Pt transferred supine>sitting EOB with min assist to transfer trunk to upright position. Pt utilized bed rails & HOB elevated and required extra time to transfer LLE to EOB. Pt transferred sit<>stand x 2 trials during session with max assist and cuing for hand placement. Pt unable to march in place as pt unable to bear weight through LLE and support herself with BUE during task. Pt transferred to recliner via stand pivot with RW by scooting RLE on floor. Pt became tearful 2/2 pain & situation with therapist providing support and encouragement. Pt performed LLE long arc quads and hip abduction pillow squeezes. RN arrived to administer meds & pt able to tolerate standing ~5 minutes to allow RN to apply gel to LLE & L neck. Pt assisted with pulling pants up with mod assist overall for balance. At end of session pt left sitting in recliner with BLE elevated, all needs within reach, & set up with meal tray.  Throughout session encouraged pt to increase weight bearing through LLE to progress to ambulation.   Therapy Documentation Precautions:  Precautions Precautions: Fall Precaution Comments: Premedicate for pain Restrictions Weight Bearing Restrictions: Yes LLE Weight Bearing: Weight bearing as tolerated     See Function Navigator for Current Functional Status.   Therapy/Group: Individual Therapy  Waunita Schooner 03/10/2016, 5:09 PM

## 2016-03-10 NOTE — Care Management Note (Signed)
[facility] Individual Statement of Services  Patient Name:  [name]  Date:  03/10/2016  Welcome to the [facility].  Our goal is to provide you with an individualized program based on your diagnosis and situation, designed to meet your specific needs.  With this comprehensive rehabilitation program, you will be expected to participate in at least 3 hours of rehabilitation therapies Monday-Friday, with modified therapy programming on the weekends.  Your rehabilitation program will include the following services:  Physical Therapy (PT), Occupational Therapy (OT), 24 hour per day rehabilitation nursing, Therapeutic Recreaction (TR), Case Management (Social Worker), Rehabilitation Medicine, Nutrition Services and Pharmacy Services  Weekly team conferences will be held on Wednesday to discuss your progress.  Your Social Worker will talk with you frequently to get your input and to update you on team discussions.  Team conferences with you and your family in attendance may also be held.  Expected length of stay: 20-24 days  Overall anticipated outcome: supervision with cueing  Depending on your progress and recovery, your program may change. Your Social Worker will coordinate services and will keep you informed of any changes. Your Social Worker's name and contact numbers are listed  below.  The following services may also be recommended but are not provided by the [facility]:   [place] will be made to provide these services after discharge if needed.  Arrangements include referral to agencies that provide these services.  Your insurance has been verified to be:  Medicare & Tricare Your primary doctor is:  [name]  Pertinent information will be shared with your doctor and your insurance company.  Social Worker:  [name], [place] or [phone]  Information discussed with and copy given to patient by: Elease Hashimoto, 03/10/2016, 8:47 AM

## 2016-03-11 ENCOUNTER — Inpatient Hospital Stay (HOSPITAL_COMMUNITY): Payer: Medicare Other | Admitting: Physical Therapy

## 2016-03-11 ENCOUNTER — Inpatient Hospital Stay (HOSPITAL_COMMUNITY): Payer: Medicare Other

## 2016-03-11 ENCOUNTER — Inpatient Hospital Stay (HOSPITAL_COMMUNITY): Payer: Medicare Other | Admitting: Occupational Therapy

## 2016-03-11 DIAGNOSIS — S72002S Fracture of unspecified part of neck of left femur, sequela: Secondary | ICD-10-CM

## 2016-03-11 DIAGNOSIS — S72002A Fracture of unspecified part of neck of left femur, initial encounter for closed fracture: Secondary | ICD-10-CM | POA: Insufficient documentation

## 2016-03-11 DIAGNOSIS — N179 Acute kidney failure, unspecified: Secondary | ICD-10-CM

## 2016-03-11 LAB — CBC WITH DIFFERENTIAL/PLATELET
BASOS PCT: 1 %
Basophils Absolute: 0 10*3/uL (ref 0.0–0.1)
EOS ABS: 0.3 10*3/uL (ref 0.0–0.7)
Eosinophils Relative: 8 %
HEMATOCRIT: 28 % — AB (ref 36.0–46.0)
Hemoglobin: 8.8 g/dL — ABNORMAL LOW (ref 12.0–15.0)
Lymphocytes Relative: 24 %
Lymphs Abs: 0.9 10*3/uL (ref 0.7–4.0)
MCH: 29.1 pg (ref 26.0–34.0)
MCHC: 31.4 g/dL (ref 30.0–36.0)
MCV: 92.7 fL (ref 78.0–100.0)
MONO ABS: 0.4 10*3/uL (ref 0.1–1.0)
MONOS PCT: 10 %
NEUTROS ABS: 2.1 10*3/uL (ref 1.7–7.7)
Neutrophils Relative %: 57 %
Platelets: 207 10*3/uL (ref 150–400)
RBC: 3.02 MIL/uL — ABNORMAL LOW (ref 3.87–5.11)
RDW: 14.4 % (ref 11.5–15.5)
WBC: 3.6 10*3/uL — ABNORMAL LOW (ref 4.0–10.5)

## 2016-03-11 LAB — BASIC METABOLIC PANEL
Anion gap: 4 — ABNORMAL LOW (ref 5–15)
BUN: 50 mg/dL — ABNORMAL HIGH (ref 6–20)
CALCIUM: 9.9 mg/dL (ref 8.9–10.3)
CO2: 31 mmol/L (ref 22–32)
CREATININE: 1.32 mg/dL — AB (ref 0.44–1.00)
Chloride: 103 mmol/L (ref 101–111)
GFR calc non Af Amer: 35 mL/min — ABNORMAL LOW (ref >60)
GFR, EST AFRICAN AMERICAN: 41 mL/min — AB (ref >60)
GLUCOSE: 105 mg/dL — AB (ref 65–99)
Potassium: 4.3 mmol/L (ref 3.5–5.1)
Sodium: 138 mmol/L (ref 135–145)

## 2016-03-11 MED ORDER — SODIUM CHLORIDE 0.9 % IV SOLN: 75.0000 mL/h | INTRAVENOUS | Status: DC

## 2016-03-11 MED ORDER — SODIUM CHLORIDE 0.9 % IV BOLUS (SEPSIS)
500.0000 mL | Freq: Once | INTRAVENOUS | Status: AC
  Administered 2016-03-11: 500 mL via INTRAVENOUS

## 2016-03-11 MED ORDER — HYDROCHLOROTHIAZIDE 12.5 MG PO CAPS
12.5000 mg | ORAL_CAPSULE | Freq: Every day | ORAL | Status: DC
  Administered 2016-03-14 – 2016-03-27 (×14): 12.5 mg via ORAL
  Filled 2016-03-11 (×14): qty 1

## 2016-03-11 NOTE — Progress Notes (Signed)
Occupational Therapy Session Note  Patient Details  Name: [name] MRN: [id number] Date of Birth: 1927/01/08  Today's Date: 03/11/2016 OT Individual Time: 1100-1205 OT Individual Time Calculation (min): 65 min     Short Term Goals:Week 1:  OT Short Term Goal 1 (Week 1): Pt will complete toilet transfer and 1 helper with LRAD OT Short Term Goal 2 (Week 1): Pt will complete bathing with Min A and AE as needed OT Short Term Goal 3 (Week 1): Pt will complete LB dressing with Max A and AE as needed OT Short Term Goal 4 (Week 1): Pt will complete toileting tasks with 1 helper  Skilled Therapeutic Interventions/Progress Updates:    Pt received in bed in gown and housecoat. She was in a great deal of pain in L neck/ clavicle region and declined B/D today. She was agreeable to getting out of bed to assess L shoulder.  Pt needed min a to roll to R and mod A to use R arm to push up into sitting, scoot all the way to the EOB and sit to stand to RW. Mod A to stand pivot to w/c. Used folded pillow behind pt's back for fully upright posture. Pt stated she has L neck/clavicle pain with trying to extend, lift , push on LUE.  She has increased pain with unsupported arm and no tenderness to touch.  Pt pain similar to [facility] joint instability. Pt will have imaging done soon per MD orders.  She was on floor for 3 hours after fall at home using arm to try to reach for her purse/ phone. Kinesiotape applied in star pattern with 4 strips of tape over AC joint. Provided pt with half lap tray to support L arm in sitting. Pt's lunch tray set up for her as it was painful to lift L arm to open containers. Pt in w/c with all needs met.  Therapy Documentation Precautions:  Precautions Precautions: Fall Precaution Comments: L neck pain Restrictions Weight Bearing Restrictions: Yes LLE Weight Bearing: Weight bearing as tolerated   Pain: Pain Assessment Pain Assessment: 0-10 Pain Score: 8  Pain Type: Acute  pain Pain Location: Neck Pain Orientation: Left Pain Descriptors / Indicators: Aching;Sore;Grimacing Pain Onset: With Activity Pain Intervention(s): RN made aware;Rest ADL: ADL ADL Comments: Please see functional navigator for ADL status  See Function Navigator for Current Functional Status.   Therapy/Group: Individual Therapy  [name] 03/11/2016, 10:49 AM

## 2016-03-11 NOTE — Progress Notes (Signed)
Physical Therapy Session Note  Patient Details  Name: [name] MRN: [id number] Date of Birth: June 29, 1926  Today's Date: 03/11/2016 PT Individual Time: 0800-0915 PT Individual Time Calculation (min): 75 min    Short Term Goals: Week 1:  PT Short Term Goal 1 (Week 1): Pt will perform bed mobility modA PT Short Term Goal 2 (Week 1): Pt will perform sit <>stand modA +1 PT Short Term Goal 3 (Week 1): Pt will perform stand pivot transfer maxA PT Short Term Goal 4 (Week 1): Pt will initiate gait training  Skilled Therapeutic Interventions/Progress Updates:    Patient supine in bed upon arrival. With increased time and effort, patient transferred to sitting to R with mod A to elevate trunk with use of rail and assist to scoot chuck pad to bring LLE to EOB. Patient sat EOB to don TED hose and socks with total A and RN administered pain medication. Discussed with RN request for k-pad to relieve L neck/shoulder girdle pain. Patient performed sit > stand from EOB using RW with verbal cues for pushing up with RUE not LUE due to pain with max A and pivoted to wheelchair with min A (unable to pick up RLE to step due to pain with increased LLE WB). Patient reporting 7-8/10 pain in L neck and 4/10 pain in LLE. Provided hot pack for L neck. Patient consumed breakfast meal seated in wheelchair. Performed sit > stand from wheelchair with max A using RW. Patient unable to advance RLE due to L neck pain with LUE WB using RW, therefore trialed hemiwalker for RUE support but patient unable to tolerate WB LLE while attempting to advance RLE using hemiwalker due to LLE pain. Seated BLE therex for strengthening and ROM, 2 sets of 15 reps each exercise: LAQ, hip flexion, ankle pumps, isometric hip adduction with pillow squeeze. Patient left in wheelchair with nurse techs present to assist with toileting.   Therapy Documentation Precautions:  Precautions Precautions: Fall Precaution Comments: L neck  pain Restrictions Weight Bearing Restrictions: Yes LLE Weight Bearing: Weight bearing as tolerated Pain: Pain Assessment Pain Assessment: 0-10 Pain Score: 8  Pain Type: Acute pain Pain Location: Neck Pain Orientation: Left Pain Descriptors / Indicators: Aching;Sore;Grimacing Pain Onset: With Activity Pain Intervention(s): RN made aware;Rest   See Function Navigator for Current Functional Status.   Therapy/Group: Individual Therapy  Seth Friedlander, Murray Hodgkins 03/11/2016, 10:06 AM

## 2016-03-11 NOTE — Progress Notes (Signed)
Patient information reviewed and entered into eRehab system by Mishelle Hassan, RN, CRRN, PPS Coordinator.  Information including medical coding and functional independence measure will be reviewed and updated through discharge.     Per nursing patient was given "Data Collection Information Summary for Patients in Inpatient Rehabilitation Facilities with attached "Privacy Act Statement-Health Care Records" upon admission.  

## 2016-03-11 NOTE — Progress Notes (Signed)
Physical Therapy Session Note  Patient Details  Name: [name] MRN: [id number] Date of Birth: Nov 30, 1926  Today's Date: 03/11/2016 PT Individual Time: 1258-1358 PT Individual Time Calculation (min): 60 min    Skilled Therapeutic Interventions/Progress Updates:    Pt reports 5 to 6/10 pain in L hip and shoulder prior to session.  Reports just having pain medications.  Following session, pain increased to 6 to 7/10.  Pt remains unable to utilize L UE due to pain.  Per primary therapist, pt is pending a left shoulder x-ray.  Treatment today geared towards improving functional mobility.  Pt requires substantial verbal cues for technique with sit to stand transfers and for standing weight-shifting in the walker.  Utilized [place] in room with assist of nursing to transfer to elevated toilet seat and to return pt to bed.  In bed, pt performed heel slides into hip flexion, hip abduction and ankle pumps.  Pt additionally performed long arc quads in sitting.  Pt resting in bed with needs met (call bell and phone in reach prior to therapist departure) Therapy Documentation Precautions:  Precautions Precautions: Fall Precaution Comments: L neck pain Restrictions Weight Bearing Restrictions: Yes LLE Weight Bearing: Weight bearing as tolerated      See Function Navigator for Current Functional Status.   Therapy/Group: Individual Therapy  Deatra Mcmahen Hilario Quarry 03/11/2016, 2:58 PM
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
## 2016-03-11 NOTE — Progress Notes (Signed)
[facility] PHYSICAL MEDICINE & REHABILITATION     PROGRESS NOTE  Subjective/Complaints: Pt seen laying in bed this AM.  She slept well overnight, but has some pain in her left shoulder this AM.  Therapy notes that it is worse during therapies.    ROS: +Left shoulder pain (improved). Denies CP, SOB, N/V/D.  Objective: Vital Signs: Blood pressure 122/68, pulse 78, temperature 98 F (36.7 C), temperature source Oral, resp. rate 18, height 5\' 6"  (1.676 m), weight 93.2 kg (205 lb 7.5 oz), SpO2 100 %. US Soft Tissue Neck  Result Date: 03/09/2016 CLINICAL DATA:  Left neck mass EXAM: ULTRASOUND OF HEAD/NECK SOFT TISSUES TECHNIQUE: Ultrasound examination of the head and neck soft tissues was performed in the area of clinical concern. COMPARISON:  None. FINDINGS: There is no evidence of soft tissue mass or adenopathy in the left neck. IMPRESSION: No evidence of mass or adenopathy in the left neck. Electronically Signed   By: Marybelle Killings M.D.   On: 03/09/2016 15:11    Recent Labs  03/09/16 0537 03/11/16 0803  WBC 3.7* 3.6*  HGB 8.5* 8.8*  HCT 27.4* 28.0*  PLT 190 207    Recent Labs  03/10/16 0442 03/11/16 0803  NA 139 138  K 4.5 4.3  CL 102 103  GLUCOSE 107* 105*  BUN 28* 50*  CREATININE 1.00 1.32*  CALCIUM 10.2 9.9   CBG (last 3)  No results for input(s): GLUCAP in the last 72 hours.  Wt Readings from Last 3 Encounters:  03/06/16 93.2 kg (205 lb 7.5 oz)  03/03/16 90.3 kg (199 lb)  01/15/12 87.4 kg (192 lb 9.6 oz)    Physical Exam:  BP 122/68 (BP Location: Right Arm)   Pulse 78   Temp 98 F (36.7 C) (Oral)   Resp 18   Ht 5\' 6"  (1.676 m)   Wt 93.2 kg (205 lb 7.5 oz)   SpO2 100%   BMI 33.16 kg/m  Constitutional: She appears well-developed. No distress. Obese.  HENT: Normocephalic and atraumatic.  Eyes: EOM are normal. No discharge.  Cardiovascular: RRR. No JVD. Respiratory: Effort normal and breath sounds normal.  GI: Soft. Bowel sounds are normal. .  Well healed  old abdominal incision with large midline hernia and smaller hernia at lower aspect.   Musculoskeletal: She exhibits edema and tenderness. Neurological: She is alert and oriented.  Motor: B/l deltoid, bicep, tricep, grip 5/5 RLE: 4-/5 hip flexor, knee extensor, ankle dorsal flexor LLE: 3+/5 hip flexion, 3+/5 KE, (pain inhibition, slightly improved), ankle dorsiflexion/plantar flexion 4+/5  Skin: Skin is warm and dry. She is not diaphoretic.  Hip incision with dressing c/d/i Psychiatric: Her speech is normal and behavior is normal.   Assessment/Plan: 1. Functional deficits secondary to left hip fracture which require 3+ hours per day of interdisciplinary therapy in a comprehensive inpatient rehab setting. Physiatrist is providing close team supervision and 24 hour management of active medical problems listed below. Physiatrist and rehab team continue to assess barriers to discharge/monitor patient progress toward functional and medical goals.  Function:  Bathing Bathing position   Position: Sitting EOB  Bathing parts Body parts bathed by patient: Right arm, Left arm, Chest, Abdomen, Right upper leg, Left upper leg Body parts bathed by helper: Front perineal area, Buttocks, Right lower leg, Left lower leg, Back  Bathing assist Assist Level:  (Max A)      Upper Body Dressing/Undressing Upper body dressing   What is the patient wearing?: Pull over shirt/dress  Pull over shirt/dress - Perfomed by patient: Thread/unthread left sleeve, Put head through opening, Pull shirt over trunk          Upper body assist Assist Level: Touching or steadying assistance(Pt > 75%)      Lower Body Dressing/Undressing Lower body dressing   What is the patient wearing?: [employer], Non-skid slipper socks, Pants, Underwear   Underwear - Performed by helper: Thread/unthread right underwear leg, Thread/unthread left underwear leg, Pull underwear up/down   Pants- Performed by helper: Thread/unthread  left pants leg, Thread/unthread right pants leg, Pull pants up/down   Non-skid slipper socks- Performed by helper: Don/doff right sock, Don/doff left sock               TED Hose - Performed by helper: Don/doff right TED hose, Don/doff left TED hose  Lower body assist Assist for lower body dressing:  (Max A)      Toileting Toileting   Toileting steps completed by patient: Adjust clothing prior to toileting Toileting steps completed by helper: Adjust clothing after toileting, Performs perineal hygiene Toileting Assistive Devices: Grab bar or rail  Toileting assist Assist level:  (Max A)   Transfers Chair/bed transfer   Chair/bed transfer method: Stand pivot Chair/bed transfer assist level: Maximal assist (Pt 25 - 49%/lift and lower) Chair/bed transfer assistive device: Armrests, Environmental manager lift: Ecologist Ambulation activity did not occur: Safety/medical concerns   Max distance: 15 feet     Wheelchair   Type: Manual Max wheelchair distance: 75 Assist Level: Maximal assistance (Pt 25 - 49%)  Cognition Comprehension Comprehension assist level: Understands complex 90% of the time/cues 10% of the time  Expression Expression assist level: Expresses complex 90% of the time/cues < 10% of the time  Social Interaction Social Interaction assist level: Interacts appropriately with others with medication or extra time (anti-anxiety, antidepressant).  Problem Solving Problem solving assist level: Solves basic problems with no assist  Memory Memory assist level: Recognizes or recalls 90% of the time/requires cueing < 10% of the time    Medical Problem List and Plan: 1.  Gait abnormality, difficulty with transfers, poor activity tolerance secondary to left hip fracture.  Cont CIR 2.  DVT Prophylaxis/Anticoagulation: Mechanical: Sequential compression devices, below knee Bilateral lower extremities Pharmaceutical: Lovenox  Vascular study neg and limited on  12/23 for DVT 3. Pain Management: Premedicate prior to therapy sessions.   Added voltaren gel to left shoulder   Sportscreme to back for chronic back pain.    Scheduled tylenol qid.   Robaxin PRN  4. Mood: Team to provide ego support. LCSW to follow for evaluation and support.  5. Neuropsych: This patient is capable of making decisions on his own behalf. 6. Skin/Wound Care: Monitor wound daily for healing  7. Fluids/Electrolytes/Nutrition: Monitor I/O.  8.  ABLA: Added iron supplement.   Hb 8.8 on 12/27  Cont to monitor 9. Abdominal pain/constipation:   KUB reviewed 12/23, unremarkable   Increased miralax to bid.  10. HTN: Monitor bid  PTA Norcasc 5, HCTZ 25  Cont Toprolol 12.5  HCTZ 12.5 started 12/23  Controlled 12/27  Will monitor with increased activity 11. AKI on ?CKD: On HCTZ at home--BUN/Scr 28/1.22 at admission. Encourage fluid intake.    Cr. 1.32 on 12/27  Cont encourage fluid intake  Will consider IVF if necessary  Will cont to monitor 12. Thrombocytopenia: Resolved  Likely reactive.  13. Hypoalbuminemia  Supplement initiated 12/23 14. Leukopenia   WBCs 3.6 on 12/27  Will consider consult if necessary  Will cont to monitor 15. Left neck pain with history of breast CA and mastectomy  U/S negative  Voltaren gel ordered  Premedicate before therapies  LOS (Days) 5 A FACE TO FACE EVALUATION WAS PERFORMED  [name] Lorie Phenix 03/11/2016 9:18 AM

## 2016-03-12 ENCOUNTER — Inpatient Hospital Stay (HOSPITAL_COMMUNITY): Payer: Medicare Other | Admitting: Physical Therapy

## 2016-03-12 ENCOUNTER — Inpatient Hospital Stay (HOSPITAL_COMMUNITY): Payer: Medicare Other | Admitting: Occupational Therapy

## 2016-03-12 DIAGNOSIS — M25512 Pain in left shoulder: Secondary | ICD-10-CM

## 2016-03-12 DIAGNOSIS — I952 Hypotension due to drugs: Secondary | ICD-10-CM | POA: Insufficient documentation

## 2016-03-12 DIAGNOSIS — G8929 Other chronic pain: Secondary | ICD-10-CM

## 2016-03-12 LAB — BASIC METABOLIC PANEL
ANION GAP: 7 (ref 5–15)
BUN: 56 mg/dL — ABNORMAL HIGH (ref 6–20)
CALCIUM: 10.2 mg/dL (ref 8.9–10.3)
CO2: 28 mmol/L (ref 22–32)
Chloride: 103 mmol/L (ref 101–111)
Creatinine, Ser: 1.11 mg/dL — ABNORMAL HIGH (ref 0.44–1.00)
GFR, EST AFRICAN AMERICAN: 50 mL/min — AB (ref >60)
GFR, EST NON AFRICAN AMERICAN: 43 mL/min — AB (ref >60)
Glucose, Bld: 103 mg/dL — ABNORMAL HIGH (ref 65–99)
Potassium: 4.6 mmol/L (ref 3.5–5.1)
Sodium: 138 mmol/L (ref 135–145)

## 2016-03-12 MED ORDER — METHOCARBAMOL 500 MG PO TABS
500.0000 mg | ORAL_TABLET | Freq: Four times a day (QID) | ORAL | Status: DC
  Administered 2016-03-12 – 2016-03-13 (×5): 500 mg via ORAL
  Filled 2016-03-12 (×4): qty 1

## 2016-03-12 NOTE — Progress Notes (Signed)
Physical Therapy Session Note  Patient Details  Name: [name] MRN: [id number] Date of Birth: 1926-06-16  Today's Date: 03/12/2016 PT Individual Time: 1000-1043 and [id number] PT Individual Time Calculation (min): 43 min and 70 min   Short Term Goals: Week 1:  PT Short Term Goal 1 (Week 1): Pt will perform bed mobility modA PT Short Term Goal 2 (Week 1): Pt will perform sit <>stand modA +1 PT Short Term Goal 3 (Week 1): Pt will perform stand pivot transfer maxA PT Short Term Goal 4 (Week 1): Pt will initiate gait training  Skilled Therapeutic Interventions/Progress Updates:    Treatment 1: Patient in wheelchair upon arrival. Patient reports being premedicated for pain and provided hot pack for LUE during therapy. Performed kinetron from wheelchair level at 30 cm/sec for LE strengthening and ROM, multiple trials to fatigue. Patient required multiple attempts and greatly increased time to transfer sit > stand from wheelchair due to L shoulder pain with any and all movements. Patient eventually stood with max A and utilized RW to pivot to bed with steady assist and improved ability to WB through LLE. Patient transferred sit > supine to R with mod A to lift BLE on bed and patient assisting with repositioning higher in bed in trendelenburg position. Discussed with OT from yesterday and requested LUE sling for comfort with transfers and mobility training from PA-C. Patient left semi reclined in bed with all needs in reach.   Treatment 2: Patient in bed after finishing lunch meal. With increased time, she transferred to EOB using rail with HOB raised with min A to elevate trunk with significant increase in L neck/shoulder pain during transfer and static sitting EOB. Continued assessment of L shoulder pain with no pain to palpation but patient reporting pain that starts at [facility] joint but worsens across anterior chest and L neck. Reviewed xray imaging with patient but patient unable to clarify  pain location. Patient performed sit > stand from EOB using RW with max A and pivoted to wheelchair using RW with steady assist. Patient able to propel wheelchair using BUE in controlled environment up to 150 ft including turns with no worsening of LUE pain and increased time. Patient fitted for LUE sling for comfort to trial for pain management. Attempted pre-gait tasks standing in parallel bars with RUE support and LUE in sling but unable to tolerate due to significant L anterior chest pain with patient crying out in pain. No improvement noted in pain with LUE immobilized. Patient requesting to toilet, used Stedy for toileting with +2A for clothing management and hygiene and patient left semi reclined in bed with needs in reach.   Therapy Documentation Precautions:  Precautions Precautions: Fall Precaution Comments: L neck pain Restrictions Weight Bearing Restrictions: No LLE Weight Bearing:  (FWB) Pain: Pain Assessment Pain Assessment: 0-10 Pain Score: 6  Pain Type: Acute pain Pain Location: Shoulder Pain Orientation: Left Pain Descriptors / Indicators: Aching Pain Onset: On-going Pain Intervention(s): Repositioned Multiple Pain Sites: Yes 2nd Pain Site Pain Score: 5 Pain Type: Acute pain Pain Location: Hip Pain Orientation: Left Pain Descriptors / Indicators: Aching Pain Onset: With Activity Pain Intervention(s): Repositioned   See Function Navigator for Current Functional Status.   Therapy/Group: Individual Therapy  Jermal Dismuke, Murray Hodgkins 03/12/2016, 12:12 PM

## 2016-03-12 NOTE — Progress Notes (Signed)
Orthopedic Tech Progress Note Patient Details:  [name] 12-25-1926 [id number]  Ortho Devices Type of Ortho Device: Arm sling Ortho Device/Splint Location: lue Ortho Device/Splint Interventions: Application   Keisy Strickler 03/12/2016, 1:51 PM
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
## 2016-03-12 NOTE — Progress Notes (Addendum)
Occupational Therapy Session Note  Patient Details  Name: [name] MRN: [id number] Date of Birth: Jul 02, 1926  Today's Date: 03/12/2016  Session 1 OT Individual Time: 0800-0900 OT Individual Time Calculation (min): 60 min   Session 2 OT Individual Time: 1500-1530 OT Individual Time Calculation (min): 30 min   Short Term Goals: Week 1:  OT Short Term Goal 1 (Week 1): Pt will complete toilet transfer and 1 helper with LRAD OT Short Term Goal 2 (Week 1): Pt will complete bathing with Min A and AE as needed OT Short Term Goal 3 (Week 1): Pt will complete LB dressing with Max A and AE as needed OT Short Term Goal 4 (Week 1): Pt will complete toileting tasks with 1 helper  Skilled Therapeutic Interventions/Progress Updates:    Session 1 1:1 OT session focused on modified bathing/dressing, transfer training, and sit<>stands. Pt reported need for bathroom, requested bed pan, but OT explained pt needs to beginusing BCS, pt agreeable. Mod A + extra time scooting transfer to drop arm BSC. + bowel and bladder, total A peri-care. Pt reported high pain in L shoulder w/ increased pain w/ movement. Minimal use of L UE during transfers. LB dressing completed per OT instruction with use of reacher. Stedy used for Max A sit<>stands and transfer to w/c.   Session 2 1:1 OT session focused on L UE pain management. Per Np, pain is likely referred pain. Placed new kinisiotape on L shoulder radiating to neck. Educated pt on positioning to alleviate pain and placed 2 heat packs. RN entered room to administer muscle relaxer. Pt left semi-reclined in bed with needs met.   Therapy Documentation Precautions:  Precautions Precautions: Fall Precaution Comments: L neck pain Restrictions Weight Bearing Restrictions: (P) Yes LLE Weight Bearing: (P) Weight bearing as tolerated Pain: Pain Assessment Pain Assessment: 0-10 Pain Score: 6  Pain Type: Acute pain Pain Location: Shoulder Pain Orientation:  Left Pain Descriptors / Indicators: Aching Pain Onset: With Activity Pain Intervention(s): Repositioned Multiple Pain Sites: Yes 2nd Pain Site Pain Score: 5 Pain Type: Acute pain Pain Location: Hip Pain Orientation: Left Pain Descriptors / Indicators: Aching Pain Onset: With Activity Pain Intervention(s): Repositioned;Heat applied ADL: ADL ADL Comments: Please see functional navigator for ADL status  See Function Navigator for Current Functional Status.   Therapy/Group: Individual Therapy  [name] 03/12/2016, 3:42 PM

## 2016-03-12 NOTE — Progress Notes (Signed)
[facility] PHYSICAL MEDICINE & REHABILITATION     PROGRESS NOTE  Subjective/Complaints: Pt seen sitting up in bed this AM.  She states that her left shoulder feels better and believes her muscle is pulling.  She slept well overnight.  Her have given IVF yesterday due to kidney function and hypotension.   ROS: +Left shoulder pain (improving). Denies CP, SOB, N/V/D.  Objective: Vital Signs: Blood pressure 108/68, pulse 68, temperature 98.9 F (37.2 C), temperature source Oral, resp. rate 16, height 5\' 6"  (1.676 m), weight 93.2 kg (205 lb 7.5 oz), SpO2 96 %. Dg Shoulder Left  Result Date: 03/11/2016 CLINICAL DATA:  Left shoulder pain without reported injury. EXAM: LEFT SHOULDER - 2+ VIEW COMPARISON:  None. FINDINGS: There is no evidence of fracture or dislocation. There is no evidence of arthropathy or other focal bone abnormality. Soft tissues are unremarkable. IMPRESSION: Normal left shoulder. Electronically Signed   By: Marijo Conception, M.D.   On: 03/11/2016 15:41    Recent Labs  03/11/16 0803  WBC 3.6*  HGB 8.8*  HCT 28.0*  PLT 207    Recent Labs  03/11/16 0803 03/12/16 0541  NA 138 138  K 4.3 4.6  CL 103 103  GLUCOSE 105* 103*  BUN 50* 56*  CREATININE 1.32* 1.11*  CALCIUM 9.9 10.2   CBG (last 3)  No results for input(s): GLUCAP in the last 72 hours.  Wt Readings from Last 3 Encounters:  03/06/16 93.2 kg (205 lb 7.5 oz)  03/03/16 90.3 kg (199 lb)  01/15/12 87.4 kg (192 lb 9.6 oz)    Physical Exam:  BP 108/68 (BP Location: Right Arm)   Pulse 68   Temp 98.9 F (37.2 C) (Oral)   Resp 16   Ht 5\' 6"  (1.676 m)   Wt 93.2 kg (205 lb 7.5 oz)   SpO2 96%   BMI 33.16 kg/m  Constitutional: She appears well-developed. No distress. Obese.  HENT: Normocephalic and atraumatic.  Eyes: EOM are normal. No discharge.  Cardiovascular: RRR. No JVD. Respiratory: Effort normal and breath sounds normal.  GI: Soft. Bowel sounds are normal.  Well healed old abdominal incision  with large midline hernia and smaller hernia at lower aspect.   Musculoskeletal: She exhibits edema and tenderness. Neurological: She is alert and oriented.  Motor: B/l deltoid, bicep, tricep, grip 5/5 (LUE slightly limited due to pain) RLE: 4-/5 hip flexor, knee extensor, ankle dorsal flexor LLE: 3+/5 hip flexion, 3+/5 KE, (pain inhibition, stable), ankle dorsiflexion/plantar flexion 4+/5  Skin: Skin is warm and dry. She is not diaphoretic.  Hip incision with dressing c/d/i Psychiatric: Her speech is normal and behavior is normal.   Assessment/Plan: 1. Functional deficits secondary to left hip fracture which require 3+ hours per day of interdisciplinary therapy in a comprehensive inpatient rehab setting. Physiatrist is providing close team supervision and 24 hour management of active medical problems listed below. Physiatrist and rehab team continue to assess barriers to discharge/monitor patient progress toward functional and medical goals.  Function:  Bathing Bathing position   Position: Sitting EOB  Bathing parts Body parts bathed by patient: Left arm, Right arm, Chest, Abdomen, Right upper leg, Left upper leg Body parts bathed by helper: Right lower leg, Left lower leg, Buttocks, Front perineal area  Bathing assist Assist Level:  (Mod A)      Upper Body Dressing/Undressing Upper body dressing   What is the patient wearing?: Pull over shirt/dress     Pull over shirt/dress - Perfomed by  patient: Thread/unthread right sleeve, Put head through opening Pull over shirt/dress - Perfomed by helper: Thread/unthread left sleeve, Pull shirt over trunk        Upper body assist Assist Level: Touching or steadying assistance(Pt > 75%)      Lower Body Dressing/Undressing Lower body dressing   What is the patient wearing?: [employer], Non-skid slipper socks, Pants, Underwear   Underwear - Performed by helper: Thread/unthread right underwear leg, Thread/unthread left underwear leg, Pull  underwear up/down   Pants- Performed by helper: Thread/unthread left pants leg, Thread/unthread right pants leg, Pull pants up/down   Non-skid slipper socks- Performed by helper: Don/doff right sock, Don/doff left sock               TED Hose - Performed by helper: Don/doff right TED hose, Don/doff left TED hose  Lower body assist Assist for lower body dressing:  (Max A)      Toileting Toileting   Toileting steps completed by patient: Performs perineal hygiene Toileting steps completed by helper: Adjust clothing prior to toileting, Adjust clothing after toileting, Performs perineal hygiene Toileting Assistive Devices: Grab bar or rail  Toileting assist Assist level: Touching or steadying assistance (Pt.75%)   Transfers Chair/bed transfer   Chair/bed transfer method: Stand pivot Chair/bed transfer assist level: Maximal assist (Pt 25 - 49%/lift and lower) Chair/bed transfer assistive device: Armrests, Environmental manager lift: Ecologist Ambulation activity did not occur: Safety/medical concerns   Max distance: 15 feet     Wheelchair   Type: Manual Max wheelchair distance: 100 ft Assist Level: Maximal assistance (Pt 25 - 49%)  Cognition Comprehension Comprehension assist level: Understands complex 90% of the time/cues 10% of the time  Expression Expression assist level: Expresses complex 90% of the time/cues < 10% of the time  Social Interaction Social Interaction assist level: Interacts appropriately with others with medication or extra time (anti-anxiety, antidepressant).  Problem Solving Problem solving assist level: Solves basic problems with no assist  Memory Memory assist level: Recognizes or recalls 90% of the time/requires cueing < 10% of the time    Medical Problem List and Plan: 1.  Gait abnormality, difficulty with transfers, poor activity tolerance secondary to left hip fracture.  Cont CIR 2.  DVT Prophylaxis/Anticoagulation: Mechanical:  Sequential compression devices, below knee Bilateral lower extremities Pharmaceutical: Lovenox  Vascular study neg and limited on 12/23 for DVT 3. Pain Management: Premedicate prior to therapy sessions.   Added voltaren gel to left shoulder   Sportscreme to back for chronic back pain.    Scheduled tylenol qid.   Robaxin PRN  4. Mood: Team to provide ego support. LCSW to follow for evaluation and support.  5. Neuropsych: This patient is capable of making decisions on his own behalf. 6. Skin/Wound Care: Monitor wound daily for healing  7. Fluids/Electrolytes/Nutrition: Monitor I/O.  8.  ABLA: Added iron supplement.   Hb 8.8 on 12/27  Cont to monitor 9. Abdominal pain/constipation:   KUB reviewed 12/23, unremarkable   Increased miralax to bid.  10. HTN: Monitor bid  PTA Norcasc 5, HCTZ 25  Cont Toprolol 12.5  HCTZ 12.5 started 12/23, on hold until 12/30, due to hypotension 12/27  Improved 12/28  Will monitor with increased activity 11. AKI on ?CKD: On HCTZ at home--BUN/Scr 28/1.22 at admission. Encourage fluid intake.    Cr. 1.11 on 12/28  Cont encourage fluid intake  IVF bolused on 12/27  Will cont to monitor 12. Thrombocytopenia: Resolved  Likely reactive.  13. Hypoalbuminemia  Supplement initiated 12/23 14. Leukopenia   WBCs 3.6 on 12/27  Will consider consult if necessary  Will cont to monitor 15. Left neck/shoulder pain with history of breast CA and mastectomy  U/S negative  Voltaren gel ordered  Xray shoulder reviewed, unremarkable  Premedicate before therapies, discussed with therapies as well.  Scheduled Robaxin  LOS (Days) 6 A FACE TO FACE EVALUATION WAS PERFORMED  Euleta Belson Lorie Phenix 03/12/2016 12:48 PM

## 2016-03-13 ENCOUNTER — Inpatient Hospital Stay (HOSPITAL_COMMUNITY): Payer: Medicare Other | Admitting: Occupational Therapy

## 2016-03-13 ENCOUNTER — Inpatient Hospital Stay (HOSPITAL_COMMUNITY): Payer: Medicare Other | Admitting: Physical Therapy

## 2016-03-13 MED ORDER — METHOCARBAMOL 750 MG PO TABS
750.0000 mg | ORAL_TABLET | Freq: Four times a day (QID) | ORAL | Status: DC
  Administered 2016-03-13 – 2016-03-16 (×14): 750 mg via ORAL
  Filled 2016-03-13 (×14): qty 1

## 2016-03-13 NOTE — Progress Notes (Signed)
[name] & [facility]ABILITATION     PROGRESS NOTE  Subjective/Complaints: Pt seen laying in bed this AM.  She slept well overnight and states her pain is improving in her left shoulder/neck area.  ROS: +Left shoulder pain (improving). Denies CP, SOB, N/V/D.  Objective: Vital Signs: Blood pressure (!) 142/58, pulse (!) 58, temperature 98 F (36.7 C), temperature source Oral, resp. rate 18, height 5\' 6"  (1.676 m), weight 93.2 kg (205 lb 7.5 oz), SpO2 98 %. Dg Shoulder Left  Result Date: 03/11/2016 CLINICAL DATA:  Left shoulder pain without reported injury. EXAM: LEFT SHOULDER - 2+ VIEW COMPARISON:  None. FINDINGS: There is no evidence of fracture or dislocation. There is no evidence of arthropathy or other focal bone abnormality. Soft tissues are unremarkable. IMPRESSION: Normal left shoulder. Electronically Signed   By: Marijo Conception, M.D.   On: 03/11/2016 15:41    Recent Labs  03/11/16 0803  WBC 3.6*  HGB 8.8*  HCT 28.0*  PLT 207    Recent Labs  03/11/16 0803 03/12/16 0541  NA 138 138  K 4.3 4.6  CL 103 103  GLUCOSE 105* 103*  BUN 50* 56*  CREATININE 1.32* 1.11*  CALCIUM 9.9 10.2   CBG (last 3)  No results for input(s): GLUCAP in the last 72 hours.  Wt Readings from Last 3 Encounters:  03/06/16 93.2 kg (205 lb 7.5 oz)  03/03/16 90.3 kg (199 lb)  01/15/12 87.4 kg (192 lb 9.6 oz)    Physical Exam:  BP (!) 142/58 (BP Location: Right Arm)   Pulse (!) 58   Temp 98 F (36.7 C) (Oral)   Resp 18   Ht 5\' 6"  (1.676 m)   Wt 93.2 kg (205 lb 7.5 oz)   SpO2 98%   BMI 33.16 kg/m  Constitutional: She appears well-developed. No distress. Obese.  HENT: Normocephalic and atraumatic.  Eyes: EOM are normal. No discharge.  Cardiovascular: RRR. No JVD. Respiratory: Effort normal and breath sounds normal.  GI: Soft. Bowel sounds are normal.  Midline and RLQ hernias Musculoskeletal: She exhibits edema and tenderness. Neurological: She is alert and oriented.   Motor: B/l deltoid, bicep, tricep, grip 5/5 (LUE slightly limited due to pain) RLE: 4-/5 hip flexor, knee extensor, ankle dorsal flexor LLE: 3+/5 hip flexion, 3+/5 KE, (pain inhibition, unchanged), ankle dorsiflexion/plantar flexion 4+/5  Skin: Skin is warm and dry. She is not diaphoretic.  Hip incision c/d/i Psychiatric: Her speech is normal and behavior is normal.   Assessment/Plan: 1. Functional deficits secondary to left hip fracture which require 3+ hours per day of interdisciplinary therapy in a comprehensive inpatient rehab setting. Physiatrist is providing close team supervision and 24 hour management of active medical problems listed below. Physiatrist and rehab team continue to assess barriers to discharge/monitor patient progress toward functional and medical goals.  Function:  Bathing Bathing position   Position: Wheelchair/chair at sink  Bathing parts Body parts bathed by patient: Right arm, Left arm, Chest, Abdomen, Left upper leg, Right upper leg Body parts bathed by helper: Right lower leg, Left lower leg, Buttocks, Front perineal area  Bathing assist Assist Level: Touching or steadying assistance(Pt > 75%)      Upper Body Dressing/Undressing Upper body dressing   What is the patient wearing?: Pull over shirt/dress, Bra Bra - Perfomed by patient: Thread/unthread right bra strap Bra - Perfomed by helper: Thread/unthread left bra strap, Hook/unhook bra (pull down sports bra) Pull over shirt/dress - Perfomed by patient: Thread/unthread left sleeve  Pull over shirt/dress - Perfomed by helper: Thread/unthread right sleeve, Pull shirt over trunk, Put head through opening        Upper body assist Assist Level: Touching or steadying assistance(Pt > 75%)      Lower Body Dressing/Undressing Lower body dressing   What is the patient wearing?: Underwear, Pants   Underwear - Performed by helper: Thread/unthread right underwear leg, Thread/unthread left underwear leg, Pull  underwear up/down   Pants- Performed by helper: Thread/unthread right pants leg, Thread/unthread left pants leg, Pull pants up/down   Non-skid slipper socks- Performed by helper: Don/doff right sock, Don/doff left sock               TED Hose - Performed by helper: Don/doff right TED hose, Don/doff left TED hose  Lower body assist Assist for lower body dressing: Touching or steadying assistance (Pt > 75%)      Toileting Toileting   Toileting steps completed by patient: Adjust clothing prior to toileting, Performs perineal hygiene, Adjust clothing after toileting Toileting steps completed by helper: Adjust clothing prior to toileting, Adjust clothing after toileting Toileting Assistive Devices: Grab bar or rail  Toileting assist Assist level: Touching or steadying assistance (Pt.75%)   Transfers Chair/bed transfer   Chair/bed transfer method: Other Chair/bed transfer assist level: Maximal assist (Pt 25 - 49%/lift and lower) Chair/bed transfer assistive device: Mechanical lift Mechanical lift: Stedy   Locomotion Ambulation Ambulation activity did not occur: Safety/medical concerns   Max distance: 15 feet     Wheelchair   Type: Manual Max wheelchair distance: 150 ft Assist Level: Supervision or verbal cues  Cognition Comprehension Comprehension assist level: Follows complex conversation/direction with extra time/assistive device  Expression Expression assist level: Expresses complex ideas: With extra time/assistive device  Social Interaction Social Interaction assist level: Interacts appropriately with others with medication or extra time (anti-anxiety, antidepressant).  Problem Solving Problem solving assist level: Solves basic problems with no assist  Memory Memory assist level: Recognizes or recalls 90% of the time/requires cueing < 10% of the time    Medical Problem List and Plan: 1.  Gait abnormality, difficulty with transfers, poor activity tolerance secondary to  left hip fracture.  Cont CIR 2.  DVT Prophylaxis/Anticoagulation: Mechanical: Sequential compression devices, below knee Bilateral lower extremities Pharmaceutical: Lovenox  Vascular study neg and limited on 12/23 for DVT 3. Pain Management: Premedicate prior to therapy sessions.   Added voltaren gel to left shoulder   Sportscreme to back for chronic back pain.    Scheduled tylenol qid.   Robaxin PRN  4. Mood: Team to provide ego support. LCSW to follow for evaluation and support.  5. Neuropsych: This patient is capable of making decisions on his own behalf. 6. Skin/Wound Care: Monitor wound daily for healing  7. Fluids/Electrolytes/Nutrition: Monitor I/O.  8.  ABLA: Added iron supplement.   Hb 8.8 on 12/27  Labs ordered for Monday  Cont to monitor 9. Abdominal pain/constipation:   KUB reviewed 12/23, unremarkable   Increased miralax to bid.  10. HTN: Monitor bid  PTA Norcasc 5, HCTZ 25  Cont Toprolol 12.5  HCTZ 12.5 started 12/23, on hold until 12/30, due to hypotension 12/27  Labile, but overall controlled 12/29  Will monitor with increased activity 11. AKI on ?CKD: On HCTZ at home--BUN/Scr 28/1.22 at admission. Encourage fluid intake.    Cr. 1.11 on 12/28  Cont encourage fluid intake  IVF bolused on 12/27  Labs ordered for Monday  Will cont to monitor 12. Thrombocytopenia: Resolved  Likely reactive.  13. Hypoalbuminemia  Supplement initiated 12/23 14. Leukopenia   WBCs 3.6 on 12/27  Will consider consult if necessary  Labs ordered for Monday  Will cont to monitor 15. Left neck/shoulder pain with history of breast CA and mastectomy  U/S negative  Voltaren gel ordered  Xray shoulder reviewed, unremarkable  Premedicate before therapies, discussed with therapies as well.  Scheduled Robaxin, increased dose on 12/29  Cont heat  ?Improving  LOS (Days) 7 A FACE TO FACE EVALUATION WAS PERFORMED  [name] 03/13/2016 1:20 PM

## 2016-03-13 NOTE — Progress Notes (Signed)
Occupational Therapy Weekly Progress Note  Patient Details  Name: [name] MRN: [id number] Date of Birth: [birth year]  Beginning of progress report period: March 07, 2016 End of progress report period: March 13, 2016  Today's Date: 03/13/2016  Session 1 OT Individual Time: 1000-1100 OT Individual Time Calculation (min): 60 min   Session 2 OT Individual Time: 1300-1416 OT Individual Time Calculation (min): 76 min    Patient has met 2 of 4 short term goals.  Pt is making progress with OT treatments but continues to be limited by L hip pain and new L shoulder>neck pain. She is transferred with overall Max A of 1 helper using scooting method or stedy depending on pain and fatigue level. She requires increased time for self-care tasks, and is progressing with use of ADL AE for LB ADLs.   Patient continues to demonstrate the following deficits: muscle weakness and decreased standing balance, decreased balance strategies and difficulty maintaining precautions, difficulty with pain management, and therefore will continue to benefit from skilled OT intervention to enhance overall performance with BADL.  Patient progressing toward long term goals..  Continue plan of care.  OT Short Term Goals Week 1:  OT Short Term Goal 1 (Week 1): Pt will complete toilet transfer and 1 helper with LRAD OT Short Term Goal 1 - Progress (Week 1): Met OT Short Term Goal 2 (Week 1): Pt will complete bathing with Min A and AE as needed OT Short Term Goal 2 - Progress (Week 1): Progressing toward goal OT Short Term Goal 3 (Week 1): Pt will complete LB dressing with Max A and AE as needed OT Short Term Goal 3 - Progress (Week 1): Met OT Short Term Goal 4 (Week 1): Pt will complete toileting tasks with 1 helper OT Short Term Goal 4 - Progress (Week 1): Progressing toward goal Week 2:  OT Short Term Goal 1 (Week 2): Pt will complete bathing with Min A and AE as needed OT Short Term Goal 2 (Week 2):  Pt will complete LB dressing with Mod A and AE as needed OT Short Term Goal 3 (Week 2): Pt will complete toileting tasks with 1 helper OT Short Term Goal 4 (Week 2): Pt will completed sit<>stand with Mod A for self-care tasks at the sink.    Skilled Therapeutic Interventions/Progress Updates:    Session 1 1:1 OT session focused on transfer training, LB dressing modifications and sit<>stands. Scooting transfer from w/c > wide drop-arm BSC over toilt w/ Mod A and increased time. Pt voided bladder, required total A for peri-care. Pt unable to complete scooting transfer back to wc 2/2 L hip and neck pain. Stedy used to stand from toilet and transfer back to w/c. Bathing/dressing completed at the sink with focus on modified strategies using long-handled Ae. Max A sit<>stand at sink to wash buttocks and pull pants up over hips. L shoulder/neck pain  Throughout self-care activities. Applied muscle rub for pain relief.   Session 2 Second OT session focused on LB dressing modifications and L shoulder/neck pain management. OT demonstrated technique for donning TED hose using friction reducing device and sock-aid to don non-skid socks. Pt able to don R and L sock using sock-aid with OT instruction, but unable to don TED hose 2/2 L hip pain with bending. L shoulder and neck pain limiting ADL participation. Given pain orientation and descriptions, possiblye nerve related? Seated in w/c, OT assisted pt with nerve glide exercises, then alternated heat therapy with improved pain after  3x + rest breaks in between. Pt then completed scooting transfer from w/c >bed towards L side with Mod A and max encouragement. Pt left semi-reclined in bed with pillow support under L UE- reporting improved pain s/p therapy. Pt left with needs met and RN notified of pt status.  Therapy Documentation Precautions:  Precautions Precautions: Fall Precaution Comments: L neck pain Restrictions Weight Bearing Restrictions: Yes LLE Weight  Bearing: Weight bearing as tolerated Pain:   ADL: ADL ADL Comments: Please see functional navigator for ADL status  See Function Navigator for Current Functional Status.   Therapy/Group: Individual Therapy  [name] 03/13/2016, 2:34 PM

## 2016-03-13 NOTE — Progress Notes (Signed)
Physical Therapy Weekly Progress Note  Patient Details  Name: [name] MRN: [id number] Date of Birth: [birth year]  Beginning of progress report period: March 07, 2016 End of progress report period: March 13, 2016  Today's Date: 03/13/2016 PT Individual Time: 0800-0900 PT Individual Time Calculation (min): 60 min   Patient has met 2 of 4 short term goals. Patient making slow progress and is extremely limited by L sided anterior chest and neck pain with all movement impairing her ability to utilize LUE during transfers, bed mobility, and using RW to initiate ambulation. Patient requires more than reasonable time to complete tasks due to pain. Patient currently requires min-mod A for bed mobility in hospital bed, max A for sit <> stand transfers and pivots once in standing with min A. Patient unable to advance RLE due to pain with increased LLE WB in order to ambulate at this time.   Patient continues to demonstrate the following deficits muscle weakness, muscle joint tightness and pain, decreased cardiorespiratoy endurance and decreased standing balance and decreased balance strategies and therefore will continue to benefit from skilled PT intervention to increase functional independence with mobility.  Patient progressing toward long term goals.  Continue plan of care.  PT Short Term Goals Week 1:  PT Short Term Goal 1 (Week 1): Pt will perform bed mobility modA PT Short Term Goal 1 - Progress (Week 1): Met PT Short Term Goal 2 (Week 1): Pt will perform sit <>stand modA +1 PT Short Term Goal 2 - Progress (Week 1): Not met PT Short Term Goal 3 (Week 1): Pt will perform stand pivot transfer maxA PT Short Term Goal 3 - Progress (Week 1): Met PT Short Term Goal 4 (Week 1): Pt will initiate gait training PT Short Term Goal 4 - Progress (Week 1): Not met Week 2:  PT Short Term Goal 1 (Week 2): Patient will perform bed mobility min A with hospital bed functions.  PT Short Term  Goal 2 (Week 2): Pt will perform sit <>stand transfers with mod A.  PT Short Term Goal 3 (Week 2): Patient will initiate gait training.  Skilled Therapeutic Interventions/Progress Updates:    Patient asleep in bed upon arrival. Transferred to EOB to R using rail with HOB slightly elevated with increased time and min A to elevate trunk. Patient utilized [place] with max A for sit <> stand to transfer from bed to elevated commode over toilet and was able to perform hygiene with setup and manage clothing while standing in [place] with steady assist for balance. Patient washed hands, washed face, and donned dentures from [employer] at sink with setup assist. Patient set up for breakfast meal from wheelchair and RN present to administer pain medication. Patient attempted to propel wheelchair using BUE approx 5 ft but terminated due to pain. Performed sit <> stand from wheelchair with max A. Using rail in hallway, patient attempted to advance RLE with RUE support but crying out in pain and redirected to chair. Patient left sitting in wheelchair with needs in reach.   Therapy Documentation Precautions:  Precautions Precautions: Fall Precaution Comments: L neck pain Restrictions Weight Bearing Restrictions: Yes LLE Weight Bearing: Weight bearing as tolerated Pain: Pain Assessment Pain Assessment: 0-10 Pain Score: 6  Pain Type: Acute pain Pain Location: Hip Pain Orientation: Left Pain Descriptors / Indicators: Aching Pain Frequency: Intermittent Pain Onset: With Activity Patients Stated Pain Goal: 3 Pain Intervention(s): RN made aware;Repositioned;Rest   See Function Navigator for Current Functional Status.  Therapy/Group:  Individual Therapy  Brant Peets, Murray Hodgkins 03/13/2016, 9:02 AM

## 2016-03-14 NOTE — Progress Notes (Signed)
PHYSICAL MEDICINE & REHABILITATION     PROGRESS NOTE  Subjective/Complaints: No new issues.   ROS: pt denies nausea, vomiting, diarrhea, cough, shortness of breath or chest pain  Objective: Vital Signs: Blood pressure 113/68, pulse 76, temperature 97.9 F (36.6 C), temperature source Oral, resp. rate 16, height 5\' 6"  (1.676 m), weight 93.2 kg (205 lb 7.5 oz), SpO2 99 %. No results found. No results for input(s): WBC, HGB, HCT, PLT in the last 72 hours.  Recent Labs  03/12/16 0541  NA 138  K 4.6  CL 103  GLUCOSE 103*  BUN 56*  CREATININE 1.11*  CALCIUM 10.2   CBG (last 3)  No results for input(s): GLUCAP in the last 72 hours.  Wt Readings from Last 3 Encounters:  03/06/16 93.2 kg (205 lb 7.5 oz)  03/03/16 90.3 kg (199 lb)  01/15/12 87.4 kg (192 lb 9.6 oz)    Physical Exam:  BP 113/68   Pulse 76   Temp 97.9 F (36.6 C) (Oral)   Resp 16   Ht 5\' 6"  (1.676 m)   Wt 93.2 kg (205 lb 7.5 oz)   SpO2 99%   BMI 33.16 kg/m  Constitutional: She appears well-developed. No distress. Obese.  HENT: Normocephalic and atraumatic.  Eyes: EOM are normal. No discharge.  Cardiovascular: RRR. No JVD. Respiratory: Effort normal and breath sounds normal.  GI: Soft. Bowel sounds are normal.  Midline and RLQ hernias Musculoskeletal: She exhibits edema and tenderness. Neurological: She is alert and oriented.  Motor: B/l deltoid, bicep, tricep, grip 5/5 (LUE slightly limited due to pain) RLE: 4-/5 hip flexor, knee extensor, ankle dorsal flexor LLE: 3+/5 hip flexion, 3+/5 KE, (pain inhibition, unchanged), ankle dorsiflexion/plantar flexion 4+/5  Skin: Skin is warm and dry. She is not diaphoretic.  Hip incision c/d/i Psychiatric: Her speech is normal and behavior is normal.   Assessment/Plan: 1. Functional deficits secondary to left hip fracture which require 3+ hours per day of interdisciplinary therapy in a comprehensive inpatient rehab setting. Physiatrist is providing  close team supervision and 24 hour management of active medical problems listed below. Physiatrist and rehab team continue to assess barriers to discharge/monitor patient progress toward functional and medical goals.  Function:  Bathing Bathing position   Position: Wheelchair/chair at sink  Bathing parts Body parts bathed by patient: Right arm, Left arm, Chest, Abdomen, Left upper leg, Right upper leg Body parts bathed by helper: Right lower leg, Left lower leg, Buttocks, Front perineal area  Bathing assist Assist Level: Touching or steadying assistance(Pt > 75%)      Upper Body Dressing/Undressing Upper body dressing   What is the patient wearing?: Pull over shirt/dress, Bra Bra - Perfomed by patient: Thread/unthread right bra strap Bra - Perfomed by helper: Thread/unthread left bra strap, Hook/unhook bra (pull down sports bra) Pull over shirt/dress - Perfomed by patient: Thread/unthread left sleeve Pull over shirt/dress - Perfomed by helper: Thread/unthread right sleeve, Pull shirt over trunk, Put head through opening        Upper body assist Assist Level: Touching or steadying assistance(Pt > 75%)      Lower Body Dressing/Undressing Lower body dressing   What is the patient wearing?: Socks, [employer] - Performed by helper: Thread/unthread right underwear leg, Thread/unthread left underwear leg, Pull underwear up/down   Pants- Performed by helper: Thread/unthread right pants leg, Thread/unthread left pants leg, Pull pants up/down Non-skid slipper socks- Performed by patient: Don/doff right sock, Don/doff left sock Non-skid slipper  socks- Performed by helper: Don/doff right sock, Don/doff left sock               TED Hose - Performed by helper: Don/doff right TED hose, Don/doff left TED hose  Lower body assist Assist for lower body dressing: Assistive device Assistive Device Comment: sock-aid    Toileting Toileting   Toileting steps completed by patient:  Adjust clothing prior to toileting, Performs perineal hygiene, Adjust clothing after toileting Toileting steps completed by helper: Adjust clothing prior to toileting, Adjust clothing after toileting Toileting Assistive Devices: Grab bar or rail  Toileting assist Assist level: Touching or steadying assistance (Pt.75%)   Transfers Chair/bed transfer   Chair/bed transfer method: Other Chair/bed transfer assist level: Maximal assist (Pt 25 - 49%/lift and lower) Chair/bed transfer assistive device: Mechanical lift Mechanical lift: Stedy   Locomotion Ambulation Ambulation activity did not occur: Safety/medical concerns   Max distance: 15 feet     Wheelchair   Type: Manual Max wheelchair distance: 150 ft Assist Level: Supervision or verbal cues  Cognition Comprehension Comprehension assist level: Understands complex 90% of the time/cues 10% of the time  Expression Expression assist level: Expresses complex 90% of the time/cues < 10% of the time  Social Interaction Social Interaction assist level: Interacts appropriately with others with medication or extra time (anti-anxiety, antidepressant).  Problem Solving Problem solving assist level: Solves basic problems with no assist  Memory Memory assist level: Recognizes or recalls 75 - 89% of the time/requires cueing 10 - 24% of the time    Medical Problem List and Plan: 1.  Gait abnormality, difficulty with transfers, poor activity tolerance secondary to left hip fracture.  Cont CIR therapies 2.  DVT Prophylaxis/Anticoagulation: Mechanical: Sequential compression devices, below knee Bilateral lower extremities Pharmaceutical: Lovenox  Vascular study neg and limited on 12/23 for DVT 3. Pain Management: Premedicate prior to therapy sessions.   Added voltaren gel to left shoulder   Sportscreme to back for chronic back pain.    Scheduled tylenol qid.   Robaxin PRN  4. Mood: Team to provide ego support. LCSW to follow for evaluation and  support.  5. Neuropsych: This patient is capable of making decisions on his own behalf. 6. Skin/Wound Care: Monitor wound daily for healing  7. Fluids/Electrolytes/Nutrition: Monitor I/O.  8.  ABLA: Added iron supplement.   Hb 8.8 on 12/27  Labs ordered for Monday  Cont to monitor 9. Abdominal pain/constipation:   KUB reviewed 12/23, unremarkable   Increased miralax to bid.  10. HTN: Monitor bid  PTA Norcasc 5, HCTZ 25  Cont Toprolol 12.5  HCTZ 12.5 started 12/23, on hold until 12/30, due to hypotension 12/27  Labile, but overall controlled 12/29  Will monitor with increased activity 11. AKI on ?CKD: On HCTZ at home--BUN/Scr 28/1.22 at admission. Encourage fluid intake.    Cr. 1.11 on 12/28  Cont encourage fluid intake  IVF bolused on 12/27  Labs ordered for Monday  Will cont to monitor 12. Thrombocytopenia: Resolved  Likely reactive.  13. Hypoalbuminemia  Supplement initiated 12/23 14. Leukopenia   WBCs 3.6 on 12/27  Will consider consult if necessary  Labs ordered for Monday  Will cont to monitor 15. Left neck/shoulder pain with history of breast CA and mastectomy  U/S negative  Voltaren gel ordered  Xray shoulder reviewed, unremarkable  Premedicate before therapies, discussed with therapies as well.  Scheduled Robaxin, increased dose on 12/29  Cont heat  ?Improving  LOS (Days) 8 A FACE TO FACE EVALUATION  WAS PERFORMED  [name] T 03/14/2016 9:54 AM

## 2016-03-15 ENCOUNTER — Inpatient Hospital Stay (HOSPITAL_COMMUNITY): Payer: Medicare Other

## 2016-03-15 ENCOUNTER — Inpatient Hospital Stay (HOSPITAL_COMMUNITY): Payer: Medicare Other | Admitting: Physical Therapy

## 2016-03-15 NOTE — Progress Notes (Addendum)
Occupational Therapy Session Note  Patient Details  Name: [name] MRN: [id number] Date of Birth: 24-Sep-1926  Today's Date: 03/15/2016 OT Individual Time: 1000-1100 OT Individual Time Calculation (min): 60 min   Short Term Goals: Week 2:  OT Short Term Goal 1 (Week 2): Pt will complete bathing with Min A and AE as needed OT Short Term Goal 2 (Week 2): Pt will complete LB dressing with Mod A and AE as needed OT Short Term Goal 3 (Week 2): Pt will complete toileting tasks with 1 helper OT Short Term Goal 4 (Week 2): Pt will completed sit<>stand with Mod A for self-care tasks at the sink.   Skilled Therapeutic Interventions/Progress Updates:   ADL-retraining at bed level with focus on adaptive bathing/dressing skills, pain management, bed mobility, and family education (dtr and granddaughter arrived near end of session).   Pt received in bed with emesis basin present and pt reporting nausea and severe pain.   RN made aware and RN tech assisted with contact for K-pad delivery.   Pt unable to attempt bed mobility initially but agreed to bed level BADL.   With setup to provide supplies patient washed her face and hands, arms, chest and stomach but required total assist for lower body and peri-area.   With extra time and gentle PROM, pt tolerated left hip flexion of approx 40 deg, knee flexion to 60 degrees in prep for rolling to her side for assist with washing buttocks and back.   Pt attempted roll to her left but terminated roll d/t pain.   OT then provided setup for hair care using shampoo cap with HOB elevated.   Pt was provided her comb and mirror for hair care as family arrived and assisted.   Initially pt did not recognize her granddaughter "Theadora Rama" but remembered with min vc from her daughter.   Pt left with family attending to grooming details and advised to perform thorough hand hygiene when leaving d/t contact precautions in place.  Therapy Documentation Precautions:   Precautions Precautions: Fall Precaution Comments: L neck pain Restrictions Weight Bearing Restrictions: Yes LLE Weight Bearing: Weight bearing as tolerated   Pain: Pain Assessment Pain Assessment: 0-10 Pain Score: 8  Pain Type: Acute pain Pain Location: Hip Pain Orientation: Left;Posterior;Anterior Pain Descriptors / Indicators: Shooting Pain Frequency: Constant Pain Onset: On-going Patients Stated Pain Goal: 3 Pain Intervention(s): Cold applied;RN made aware;Medication (See eMAR);Distraction Multiple Pain Sites: No   ADL: ADL ADL Comments: Please see functional navigator for ADL status   See Function Navigator for Current Functional Status.   Therapy/Group: Individual Therapy   Second session: Time:  1345-1415 Time Calculation (min): 30 min  Pain Assessment: 8/10 left hip   Skilled Therapeutic Interventions: ADL-retraining with focus on bed mobility and self-feeding skills.   Pt received from physical therapist supine in bed and requiring assist for bed mobility to allow change of bed sheets.   Pt was unable to roll right or left with OT but completed partial pelvic lift using hand rails to allow OT access to bring sheets and bed pad under her buttocks.   Pt also used bed rail to perform mobility to head of bed with max assist.   Pt was encouraged to eat her lunch and became receptive self-feeding after OT reheated lunch and presented plate at bedside table with HOB elevated to appropriate height.   Overall pt required max assist for self-care d/t left leg pain and remained in bed d/t poor tolerance to movement.  Pt reports feeling her leg "pop" during assisted bed mobility previous night.   Skin integrity assessed as feasible without evidence of increased swelling or contact tenderness.  See FIM for current functional status  Therapy/Group: Individual Therapy  Zong Mcquarrie 03/15/2016, 12:01 PM

## 2016-03-15 NOTE — Plan of Care (Signed)
Problem: RH PAIN MANAGEMENT Goal: RH STG PAIN MANAGED AT OR BELOW PT'S PAIN GOAL <5  Outcome: Not Progressing Pt hollering with pain when lt knee and led was touched; MD notified ; xray done

## 2016-03-15 NOTE — Progress Notes (Signed)
1259 03/15/16 nursing Patient back from xray transporter joseph dorman.

## 2016-03-15 NOTE — Progress Notes (Signed)
Physical Therapy Session Note  Patient Details  Name: [name] MRN: [id number] Date of Birth: 1927/01/06  Today's Date: 03/15/2016 PT Individual Time: [id number] and 1300-1345 PT Individual Time Calculation (min): 34 min and 45 min   Short Term Goals: Week 2:  PT Short Term Goal 1 (Week 2): Patient will perform bed mobility min A with hospital bed functions.  PT Short Term Goal 2 (Week 2): Pt will perform sit <>stand transfers with mod A.  PT Short Term Goal 3 (Week 2): Patient will initiate gait training.  Skilled Therapeutic Interventions/Progress Updates:    Treatment 1: Pt received in bed noting someone assisted her with moving legs in bed earlier & she felt a pop in L knee that is causing her 10/10 pain. Pt reported need to use restroom and requested to use bedpan. Encouraged pt to transfer out of bed to use [facility] for optimal positioning for BM. Pt agreeable and required assistance to move to EOB. Pt required multiple rest breaks, cuing for technique/sequencing, assistance to move LLE and cuing for deep breathing. Pt limited by increasing pain with movement, unable to transfer BLE off of edge of bed, and then assisted with repositioning into supine. RN arrived and made aware of pt's c/o significant pain; RN administered meds. Pt began yelling out in pain even while lying still. Pt noted continued need to use bathroom, and was able to bridge with RLE for bedpan placement, (+) void. After bed pan removal & pt performing frontal hygiene pt began yelling out in pain also stating "I'm sick!". Provided pt with basin, (+) emesis (pt reports vomiting 2/2 pain) and an episode of urine incontinence. RN notified of pt's c/o increasing pain even without movement or touch. Pt left in care of RN. Pt with slight notable edema in L knee and bruising in posterior LLE. Pt missed 26 minutes of skilled PT 2/2 pain and N/V.  Treatment 2: RN cleared pt for treatment. Pt received in bed with family present  who exited upon PT arrival. Pt noted 10/10 pain in LLE with movement but premedicated (per RN). Pt agreeable to bed level exercises. Instructed on, and provided cuing, for pt to perform ankle pumps, quad sets, glute sets, short arc quads. Pt reported need to void and assisted pt with placement of bedpan, (+) void. When removing bed pan therapist found pt's sheets to be soiled with urine & informed pt of need for new sheets. Encouraged pt to transfer to EOB then to [facility] lift to allow sheets to be changed. Pt required mod assist to transfer BLE to EOB with significantly extra time and cuing to focus on deep breathing. Pt able to assist with movement of legs but then threw herself back onto bed yelling in pain. Educated pt on benefits of reduced pain with slow purposeful movements but pt repeatedly stated "I can't do it" and later noting "I'm sick". Assisted pt to supine in bed and pt performed rolling L/R with supervision to allow new sheets to be placed on bed. At end of session pt left in handoff to OT.  Therapy Documentation Precautions:  Precautions Precautions: Fall Precaution Comments: L neck pain Restrictions Weight Bearing Restrictions: Yes LLE Weight Bearing: Weight bearing as tolerated   General: PT Amount of Missed Time (min): 26 Minutes PT Missed Treatment Reason: Pain   See Function Navigator for Current Functional Status.   Therapy/Group: Individual Therapy  Waunita Schooner 03/15/2016, 5:55 PM

## 2016-03-15 NOTE — Progress Notes (Signed)
[id number] 03/15/16 nursing PT called RN to room for pain medication for this patient. Patient was hollering in pain when left leg is moved. Patient claims she heard a "pop" when they were trying to roll her last night. Patient unable to move leg claiks pain is from her left hip to knee. Some swelling noted on left leg but no warmth noted. MD notified.

## 2016-03-15 NOTE — Progress Notes (Signed)
[place] PHYSICAL MEDICINE & REHABILITATION     PROGRESS NOTE  Subjective/Complaints: Pt states she felt "pop" in left knee when being transferred in bed last night. Now left hip tender now too. Doesn't want to transfer/move. Oxycodone didn't help reduce pain  ROS: pt denies nausea, vomiting, diarrhea, cough, shortness of breath or chest pain  Objective: Vital Signs: Blood pressure 130/65, pulse 74, temperature 98.4 F (36.9 C), temperature source Oral, resp. rate 18, height 5\' 6"  (1.676 m), weight 93.2 kg (205 lb 7.5 oz), SpO2 95 %. No results found. No results for input(s): WBC, HGB, HCT, PLT in the last 72 hours. No results for input(s): NA, K, CL, GLUCOSE, BUN, CREATININE, CALCIUM in the last 72 hours.  Invalid input(s): CO CBG (last 3)  No results for input(s): GLUCAP in the last 72 hours.  Wt Readings from Last 3 Encounters:  03/06/16 93.2 kg (205 lb 7.5 oz)  03/03/16 90.3 kg (199 lb)  01/15/12 87.4 kg (192 lb 9.6 oz)    Physical Exam:  BP 130/65 (BP Location: Right Arm)   Pulse 74   Temp 98.4 F (36.9 C) (Oral)   Resp 18   Ht 5\' 6"  (1.676 m)   Wt 93.2 kg (205 lb 7.5 oz)   SpO2 95%   BMI 33.16 kg/m  Constitutional: She appears well-developed. No distress. Obese.  HENT: Normocephalic and atraumatic.  Eyes: EOM are normal. No discharge.  Cardiovascular: RRR. No JVD. Respiratory: Effort normal and breath sounds normal.  GI: Soft. Bowel sounds are normal.  Midline and RLQ hernias Musculoskeletal: left hip tender to palpation and simple movement of leg. Neurological: She is alert and oriented.  Motor: B/l deltoid, bicep, tricep, grip 5/5 (LUE slightly limited due to pain) RLE: 4-/5 hip flexor, knee extensor, ankle dorsal flexor LLE: 3+/5 hip flexion, 3+/5 KE, (pain inhibition, unchanged), ankle dorsiflexion/plantar flexion 4+/5  Skin: Skin is warm and dry. She is not diaphoretic.  Hip incision c/d/i Psychiatric: a bit anxious.   Assessment/Plan: 1. Functional  deficits secondary to left hip fracture which require 3+ hours per day of interdisciplinary therapy in a comprehensive inpatient rehab setting. Physiatrist is providing close team supervision and 24 hour management of active medical problems listed below. Physiatrist and rehab team continue to assess barriers to discharge/monitor patient progress toward functional and medical goals.  Function:  Bathing Bathing position   Position: Wheelchair/chair at sink  Bathing parts Body parts bathed by patient: Right arm, Left arm, Chest, Abdomen, Left upper leg, Right upper leg Body parts bathed by helper: Right lower leg, Left lower leg, Buttocks, Front perineal area  Bathing assist Assist Level: Touching or steadying assistance(Pt > 75%)      Upper Body Dressing/Undressing Upper body dressing   What is the patient wearing?: Pull over shirt/dress, Bra Bra - Perfomed by patient: Thread/unthread right bra strap Bra - Perfomed by helper: Thread/unthread left bra strap, Hook/unhook bra (pull down sports bra) Pull over shirt/dress - Perfomed by patient: Thread/unthread left sleeve Pull over shirt/dress - Perfomed by helper: Thread/unthread right sleeve, Pull shirt over trunk, Put head through opening        Upper body assist Assist Level: Touching or steadying assistance(Pt > 75%)      Lower Body Dressing/Undressing Lower body dressing   What is the patient wearing?: Socks, [employer] - Performed by helper: Thread/unthread right underwear leg, Thread/unthread left underwear leg, Pull underwear up/down   Pants- Performed by helper: Thread/unthread right pants leg,  Thread/unthread left pants leg, Pull pants up/down Non-skid slipper socks- Performed by patient: Don/doff right sock, Don/doff left sock Non-skid slipper socks- Performed by helper: Don/doff right sock, Don/doff left sock               TED Hose - Performed by helper: Don/doff right TED hose, Don/doff left TED hose   Lower body assist Assist for lower body dressing: Assistive device Assistive Device Comment: sock-aid    Toileting Toileting   Toileting steps completed by patient: Adjust clothing prior to toileting, Performs perineal hygiene, Adjust clothing after toileting Toileting steps completed by helper: Adjust clothing prior to toileting, Adjust clothing after toileting Toileting Assistive Devices: Grab bar or rail  Toileting assist Assist level: Touching or steadying assistance (Pt.75%)   Transfers Chair/bed transfer   Chair/bed transfer method: Other Chair/bed transfer assist level: Maximal assist (Pt 25 - 49%/lift and lower) Chair/bed transfer assistive device: Mechanical lift Mechanical lift: Stedy   Locomotion Ambulation Ambulation activity did not occur: Safety/medical concerns   Max distance: 15 feet     Wheelchair   Type: Manual Max wheelchair distance: 150 ft Assist Level: Supervision or verbal cues  Cognition Comprehension Comprehension assist level: Understands complex 90% of the time/cues 10% of the time  Expression Expression assist level: Expresses complex 90% of the time/cues < 10% of the time  Social Interaction Social Interaction assist level: Interacts appropriately with others with medication or extra time (anti-anxiety, antidepressant).  Problem Solving Problem solving assist level: Solves basic problems with no assist  Memory Memory assist level: Recognizes or recalls 75 - 89% of the time/requires cueing 10 - 24% of the time    Medical Problem List and Plan: 1.  Gait abnormality, difficulty with transfers, poor activity tolerance secondary to left hip fracture.  Cont CIR therapies 2.  DVT Prophylaxis/Anticoagulation: Mechanical: Sequential compression devices, below knee Bilateral lower extremities Pharmaceutical: Lovenox  Vascular study neg and limited on 12/23 for DVT 3. Pain Management: Premedicate prior to therapy sessions.   Added voltaren gel to left  shoulder   Sportscreme to back for chronic back pain.    Scheduled tylenol qid.   Robaxin PRN   -will check xr of left hip to rule out acute injury  -ice/heat in addition to oxycodone/robaxin and above 4. Mood: Team to provide ego support. LCSW to follow for evaluation and support.  5. Neuropsych: This patient is capable of making decisions on his own behalf. 6. Skin/Wound Care: Monitor wound daily for healing  7. Fluids/Electrolytes/Nutrition: Monitor I/O.  8.  ABLA: Added iron supplement.   Hb 8.8 on 12/27  Labs ordered for Monday  Cont to monitor 9. Abdominal pain/constipation:   KUB reviewed 12/23, unremarkable   Increased miralax to bid.  10. HTN: Monitor bid  PTA Norcasc 5, HCTZ 25  Cont Toprolol 12.5  HCTZ 12.5 started 12/23, on hold until 12/30, due to hypotension 12/27  Labile, but overall controlled 12/29  Will monitor with increased activity 11. AKI on ?CKD: On HCTZ at home--BUN/Scr 28/1.22 at admission. Encourage fluid intake.    Cr. 1.11 on 12/28  Cont encourage fluid intake  IVF bolused on 12/27  Labs ordered for Monday  Will cont to monitor 12. Thrombocytopenia: Resolved  Likely reactive.  13. Hypoalbuminemia  Supplement initiated 12/23 14. Leukopenia   WBCs 3.6 on 12/27  Will consider consult if necessary  Labs ordered for Monday  Will cont to monitor 15. Left neck/shoulder pain with history of breast CA and mastectomy  U/S negative  Voltaren gel ordered  Xray shoulder reviewed, unremarkable  Premedicate before therapies, discussed with therapies as well.  Scheduled Robaxin, increased dose on 12/29  Cont heat  ?Improving  LOS (Days) 9 A FACE TO FACE EVALUATION WAS PERFORMED  SWARTZ,ZACHARY T 03/15/2016 9:01 AM

## 2016-03-15 NOTE — Progress Notes (Signed)
1108 03/15/16 nursing Patient going down for Xray transported by D. Williams.

## 2016-03-16 LAB — BASIC METABOLIC PANEL
Anion gap: 6 (ref 5–15)
BUN: 49 mg/dL — AB (ref 6–20)
CHLORIDE: 102 mmol/L (ref 101–111)
CO2: 30 mmol/L (ref 22–32)
Calcium: 9.9 mg/dL (ref 8.9–10.3)
Creatinine, Ser: 1.43 mg/dL — ABNORMAL HIGH (ref 0.44–1.00)
GFR calc Af Amer: 37 mL/min — ABNORMAL LOW (ref >60)
GFR calc non Af Amer: 32 mL/min — ABNORMAL LOW (ref >60)
GLUCOSE: 119 mg/dL — AB (ref 65–99)
POTASSIUM: 4.2 mmol/L (ref 3.5–5.1)
Sodium: 138 mmol/L (ref 135–145)

## 2016-03-16 LAB — CBC WITH DIFFERENTIAL/PLATELET
Basophils Absolute: 0 10*3/uL (ref 0.0–0.1)
Basophils Relative: 1 %
EOS PCT: 4 %
Eosinophils Absolute: 0.2 10*3/uL (ref 0.0–0.7)
HCT: 26.8 % — ABNORMAL LOW (ref 36.0–46.0)
Hemoglobin: 8.3 g/dL — ABNORMAL LOW (ref 12.0–15.0)
LYMPHS ABS: 0.9 10*3/uL (ref 0.7–4.0)
LYMPHS PCT: 19 %
MCH: 29.7 pg (ref 26.0–34.0)
MCHC: 31 g/dL (ref 30.0–36.0)
MCV: 96.1 fL (ref 78.0–100.0)
MONO ABS: 0.2 10*3/uL (ref 0.1–1.0)
MONOS PCT: 4 %
Neutro Abs: 3.3 10*3/uL (ref 1.7–7.7)
Neutrophils Relative %: 72 %
PLATELETS: 199 10*3/uL (ref 150–400)
RBC: 2.79 MIL/uL — ABNORMAL LOW (ref 3.87–5.11)
RDW: 15.3 % (ref 11.5–15.5)
WBC: 4.5 10*3/uL (ref 4.0–10.5)

## 2016-03-16 NOTE — Progress Notes (Signed)
[place] PHYSICAL MEDICINE & REHABILITATION     PROGRESS NOTE  Subjective/Complaints: Pt seen laying in bed this AM.  She states she slept well overnight, except for having an incontinent episode.  Her neck pain continues to improve.   ROS: Denies CP, SOB, N/V/D.  Objective: Vital Signs: Blood pressure 110/64, pulse (!) 56, temperature 98.4 F (36.9 C), temperature source Oral, resp. rate 18, height 5\' 6"  (1.676 m), weight 93.2 kg (205 lb 7.5 oz), SpO2 95 %. Dg Hip Unilat With Pelvis 2-3 Views Left  Result Date: 03/15/2016 CLINICAL DATA:  Pt states some pain since left IM nail surgery on 03/03/2016, severe pain x yesterday evening. Pt also states pain starts at the knee and runs up leg toward hip. EXAM: DG HIP (WITH OR WITHOUT PELVIS) 2-3V LEFT COMPARISON:  03/03/2016 FINDINGS: No acute fracture. The fracture of the proximal left femur, reduced with a compression screw an intramedullary rod, appears well aligned and is unchanged from the operative images. The orthopedic hardware is well-seated. The right hip prosthesis, incompletely imaged, appears well-seated and aligned. SI joints and symphysis pubis are normally aligned. Bones are diffusely demineralized. There is edema in the proximal left thigh and hip soft tissues. IMPRESSION: 1. No acute fracture. 2. Recent proximal left femur fracture is well aligned with the compression screw an intramedullary rod, stable appearance from the operative images. Orthopedic hardware is well-seated. 3. No dislocation. Electronically Signed   By: Lajean Manes M.D.   On: 03/15/2016 12:11   No results for input(s): WBC, HGB, HCT, PLT in the last 72 hours. No results for input(s): NA, K, CL, GLUCOSE, BUN, CREATININE, CALCIUM in the last 72 hours.  Invalid input(s): CO CBG (last 3)  No results for input(s): GLUCAP in the last 72 hours.  Wt Readings from Last 3 Encounters:  03/06/16 93.2 kg (205 lb 7.5 oz)  03/03/16 90.3 kg (199 lb)  01/15/12 87.4 kg (192  lb 9.6 oz)    Physical Exam:  BP 110/64 (BP Location: Right Arm)   Pulse (!) 56   Temp 98.4 F (36.9 C) (Oral)   Resp 18   Ht 5\' 6"  (1.676 m)   Wt 93.2 kg (205 lb 7.5 oz)   SpO2 95%   BMI 33.16 kg/m  Constitutional: She appears well-developed. No distress. Obese.  HENT: Normocephalic and atraumatic.  Eyes: EOM are normal. No discharge.  Cardiovascular: RRR. No JVD. Respiratory: Effort normal and breath sounds normal.  GI: Soft. Bowel sounds are normal.  Midline and RLQ hernias Musculoskeletal: left hip tender to palpation and simple movement of leg. Neurological: She is alert and oriented.  Motor: B/l deltoid, bicep, tricep, grip 5/5 (LUE slightly limited due to pain) RLE: 4-/5 hip flexor, knee extensor, ankle dorsal flexor LLE: 2/5 hip flexion, 3+/5 KE, (pain inhibition), ankle dorsiflexion/plantar flexion 4+/5  Skin: Skin is warm and dry. She is not diaphoretic.  Hip incision c/d/i Psychiatric: a bit anxious.   Assessment/Plan: 1. Functional deficits secondary to left hip fracture which require 3+ hours per day of interdisciplinary therapy in a comprehensive inpatient rehab setting. Physiatrist is providing close team supervision and 24 hour management of active medical problems listed below. Physiatrist and rehab team continue to assess barriers to discharge/monitor patient progress toward functional and medical goals.  Function:  Bathing Bathing position   Position: Bed  Bathing parts Body parts bathed by patient: Right arm, Left arm, Chest, Abdomen Body parts bathed by helper: Right upper leg, Left upper leg, Right  lower leg, Left lower leg, Front perineal area  Bathing assist Assist Level:  (Max assist)      Upper Body Dressing/Undressing Upper body dressing   What is the patient wearing?: Hospital gown Bra - Perfomed by patient: Thread/unthread right bra strap Bra - Perfomed by helper: Thread/unthread left bra strap, Hook/unhook bra (pull down sports bra) Pull  over shirt/dress - Perfomed by patient: Thread/unthread right sleeve, Thread/unthread left sleeve, Pull shirt over trunk Pull over shirt/dress - Perfomed by helper: Thread/unthread right sleeve, Pull shirt over trunk, Put head through opening        Upper body assist Assist Level:  (max asssit)      Lower Body Dressing/Undressing Lower body dressing   What is the patient wearing?: Non-skid slipper socks   Underwear - Performed by helper: Thread/unthread right underwear leg, Thread/unthread left underwear leg, Pull underwear up/down   Pants- Performed by helper: Thread/unthread right pants leg, Thread/unthread left pants leg, Pull pants up/down Non-skid slipper socks- Performed by patient: Don/doff right sock, Don/doff left sock Non-skid slipper socks- Performed by helper: Don/doff right sock, Don/doff left sock               TED Hose - Performed by helper: Don/doff right TED hose, Don/doff left TED hose  Lower body assist Assist for lower body dressing:  (total assist d/t pain at LLE) Assistive Device Comment: sock-aid    Psychologist, occupational   Toileting steps completed by patient: Adjust clothing prior to toileting, Performs perineal hygiene, Adjust clothing after toileting Toileting steps completed by helper: Adjust clothing prior to toileting, Adjust clothing after toileting Toileting Assistive Devices: Grab bar or rail  Toileting assist Assist level: Touching or steadying assistance (Pt.75%)   Transfers Chair/bed transfer   Chair/bed transfer method: Other Chair/bed transfer assist level: Maximal assist (Pt 25 - 49%/lift and lower) Chair/bed transfer assistive device: Mechanical lift Mechanical lift: Stedy   Locomotion Ambulation Ambulation activity did not occur: Safety/medical concerns   Max distance: 15 feet     Wheelchair   Type: Manual Max wheelchair distance: 150 ft Assist Level: Supervision or verbal cues  Cognition Comprehension Comprehension assist  level: Understands complex 90% of the time/cues 10% of the time  Expression Expression assist level: Expresses complex 90% of the time/cues < 10% of the time  Social Interaction Social Interaction assist level: Interacts appropriately with others with medication or extra time (anti-anxiety, antidepressant).  Problem Solving Problem solving assist level: Solves basic 90% of the time/requires cueing < 10% of the time  Memory Memory assist level: Recognizes or recalls 75 - 89% of the time/requires cueing 10 - 24% of the time    Medical Problem List and Plan: 1.  Gait abnormality, difficulty with transfers, poor activity tolerance secondary to left hip fracture.  Cont CIR therapies  Xray hip 12/31 reviewed, stable post-surgical, event discussed with on call physician 2.  DVT Prophylaxis/Anticoagulation: Mechanical: Sequential compression devices, below knee Bilateral lower extremities Pharmaceutical: Lovenox  Vascular study neg and limited on 12/23 for DVT 3. Pain Management: Premedicate prior to therapy sessions.   Added voltaren gel to left shoulder   Sportscreme to back for chronic back pain.    Scheduled tylenol qid.   Robaxin PRN   ice/heat in addition to oxycodone/robaxin and above  Improved 4. Mood: Team to provide ego support. LCSW to follow for evaluation and support.  5. Neuropsych: This patient is capable of making decisions on his own behalf. 6. Skin/Wound Care: Monitor wound daily for  healing  7. Fluids/Electrolytes/Nutrition: Monitor I/O.  8.  ABLA: Added iron supplement.   Hb 8.8 on 12/27  Labs pending  Cont to monitor 9. Abdominal pain/constipation:   KUB reviewed 12/23, unremarkable   Increased miralax to bid.  10. HTN: Monitor bid  PTA Norcasc 5, HCTZ 25  Cont Toprolol 12.5  HCTZ 12.5 started 12/23, on hold until 12/30, due to hypotension 12/27  Controlled 1/1  Will monitor with increased activity 11. AKI on ?CKD: On HCTZ at home--BUN/Scr 28/1.22 at admission.  Encourage fluid intake.    Cr. 1.11 on 12/28  Cont encourage fluid intake  IVF bolused on 12/27  Labs pending   Will cont to monitor 12. Thrombocytopenia: Resolved  Likely reactive.  13. Hypoalbuminemia  Supplement initiated 12/23 14. Leukopenia   WBCs 3.6 on 12/27  Will consider consult if necessary  Labs pending  Will cont to monitor 15. Left neck/shoulder pain with history of breast CA and mastectomy  U/S negative  Voltaren gel ordered  Xray shoulder reviewed, unremarkable  Premedicate before therapies, discussed with therapies as well.  Scheduled Robaxin, increased dose on 12/29  Cont heat  Improving  LOS (Days) 10 A FACE TO FACE EVALUATION WAS PERFORMED  Josiyah Tozzi Lorie Phenix 03/16/2016 8:17 AM

## 2016-03-17 ENCOUNTER — Inpatient Hospital Stay (HOSPITAL_COMMUNITY): Payer: Medicare Other | Admitting: *Deleted

## 2016-03-17 ENCOUNTER — Inpatient Hospital Stay (HOSPITAL_COMMUNITY): Payer: Medicare Other | Admitting: Occupational Therapy

## 2016-03-17 ENCOUNTER — Inpatient Hospital Stay (HOSPITAL_COMMUNITY): Payer: Medicare Other | Admitting: Physical Therapy

## 2016-03-17 DIAGNOSIS — R443 Hallucinations, unspecified: Secondary | ICD-10-CM | POA: Insufficient documentation

## 2016-03-17 DIAGNOSIS — R410 Disorientation, unspecified: Secondary | ICD-10-CM

## 2016-03-17 LAB — BASIC METABOLIC PANEL
Anion gap: 7 (ref 5–15)
BUN: 48 mg/dL — ABNORMAL HIGH (ref 6–20)
CALCIUM: 10.1 mg/dL (ref 8.9–10.3)
CO2: 30 mmol/L (ref 22–32)
CREATININE: 1.19 mg/dL — AB (ref 0.44–1.00)
Chloride: 101 mmol/L (ref 101–111)
GFR, EST AFRICAN AMERICAN: 46 mL/min — AB (ref >60)
GFR, EST NON AFRICAN AMERICAN: 40 mL/min — AB (ref >60)
Glucose, Bld: 103 mg/dL — ABNORMAL HIGH (ref 65–99)
Potassium: 4.3 mmol/L (ref 3.5–5.1)
SODIUM: 138 mmol/L (ref 135–145)

## 2016-03-17 MED ORDER — CAMPHOR-MENTHOL 0.5-0.5 % EX LOTN
TOPICAL_LOTION | CUTANEOUS | Status: DC | PRN
  Administered 2016-03-23: 09:00:00 via TOPICAL
  Filled 2016-03-17: qty 222

## 2016-03-17 MED ORDER — SODIUM CHLORIDE 0.9 % IV SOLN
Freq: Every day | INTRAVENOUS | Status: DC
  Administered 2016-03-17 – 2016-03-18 (×2): via INTRAVENOUS

## 2016-03-17 MED ORDER — METHOCARBAMOL 750 MG PO TABS: 750.0000 mg | ORAL_TABLET | Freq: Four times a day (QID) | ORAL | Status: DC | PRN
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
## 2016-03-17 NOTE — Progress Notes (Signed)
Physical Therapy Session Note  Patient Details  Name: [name] MRN: [id number] Date of Birth: [birth year]  Today's Date: 03/17/2016 PT Individual Time: 1005-1100 PT Individual Time Calculation (min): 55 min    Short Term Goals: Week 2:  PT Short Term Goal 1 (Week 2): Patient will perform bed mobility min A with hospital bed functions.  PT Short Term Goal 2 (Week 2): Pt will perform sit <>stand transfers with mod A.  PT Short Term Goal 3 (Week 2): Patient will initiate gait training.  Skilled Therapeutic Interventions/Progress Updates:    Patient in wheelchair upon arrival, reporting having a rough night and being worn out from OT. Patient propelled wheelchair to and from gym using BUE with supervision and greatly increased time. Patient instructed in seated BLE therex for strengthening and ROM. Sit <> stand x 2 with max A and patient able to tolerate standing with RW for UE support x 2.5 minutes + 3 minutes with supervision. In standing, performed LLE therex including knee flexion and hip flexion to tolerance. Patient requires max A to reposition in wheelchair. Patient limited by LLE pain this date and required prolonged rest breaks and increased time to complete all tasks. Patient left sitting in wheelchair with all needs in reach.    Therapy Documentation Precautions:  Precautions Precautions: Fall Precaution Comments: L neck pain Restrictions Weight Bearing Restrictions: No LLE Weight Bearing: Weight bearing as tolerated Pain: Pain Assessment Pain Assessment: 0-10 Pain Score: 6  Pain Type: Acute pain Pain Location: Hip Pain Orientation: Left Pain Descriptors / Indicators: Aching Pain Onset: On-going Pain Intervention(s): Repositioned;Rest   See Function Navigator for Current Functional Status.   Therapy/Group: Individual Therapy  Ahsha Hinsley, Murray Hodgkins 03/17/2016, 10:55 AM

## 2016-03-17 NOTE — Progress Notes (Signed)
[place] PHYSICAL MEDICINE & REHABILITATION     PROGRESS NOTE  Subjective/Complaints: Pt laying in bed this AM.  She only says "shut up".  Hallucinating overnight per RN.   ROS: Appears to deny CP, SOB, N/V/D.  Objective: Vital Signs: Blood pressure 122/62, pulse 68, temperature 98.3 F (36.8 C), temperature source Oral, resp. rate 18, height 5\' 6"  (1.676 m), weight 93.2 kg (205 lb 7.5 oz), SpO2 98 %. Dg Hip Unilat With Pelvis 2-3 Views Left  Result Date: 03/15/2016 CLINICAL DATA:  Pt states some pain since left IM nail surgery on 03/03/2016, severe pain x yesterday evening. Pt also states pain starts at the knee and runs up leg toward hip. EXAM: DG HIP (WITH OR WITHOUT PELVIS) 2-3V LEFT COMPARISON:  03/03/2016 FINDINGS: No acute fracture. The fracture of the proximal left femur, reduced with a compression screw an intramedullary rod, appears well aligned and is unchanged from the operative images. The orthopedic hardware is well-seated. The right hip prosthesis, incompletely imaged, appears well-seated and aligned. SI joints and symphysis pubis are normally aligned. Bones are diffusely demineralized. There is edema in the proximal left thigh and hip soft tissues. IMPRESSION: 1. No acute fracture. 2. Recent proximal left femur fracture is well aligned with the compression screw an intramedullary rod, stable appearance from the operative images. Orthopedic hardware is well-seated. 3. No dislocation. Electronically Signed   By: Lajean Manes M.D.   On: 03/15/2016 12:11    Recent Labs  03/16/16 1356  WBC 4.5  HGB 8.3*  HCT 26.8*  PLT 199    Recent Labs  03/16/16 1356  NA 138  K 4.2  CL 102  GLUCOSE 119*  BUN 49*  CREATININE 1.43*  CALCIUM 9.9   CBG (last 3)  No results for input(s): GLUCAP in the last 72 hours.  Wt Readings from Last 3 Encounters:  03/06/16 93.2 kg (205 lb 7.5 oz)  03/03/16 90.3 kg (199 lb)  01/15/12 87.4 kg (192 lb 9.6 oz)    Physical Exam:  BP 122/62  (BP Location: Right Arm)   Pulse 68   Temp 98.3 F (36.8 C) (Oral)   Resp 18   Ht 5\' 6"  (1.676 m)   Wt 93.2 kg (205 lb 7.5 oz)   SpO2 98%   BMI 33.16 kg/m  Constitutional: She appears well-developed. No distress. Obese.  HENT: Normocephalic and atraumatic.  Eyes: EOM are normal. No discharge.  Cardiovascular: RRR. No JVD. Respiratory: Effort normal and breath sounds normal.  GI: Soft. Bowel sounds are normal.  Midline and RLQ hernias (stable) Musculoskeletal: left hip tender to palpation and simple movement of leg. Neurological: She is alert and oriented.  Motor: (Previously) B/l deltoid, bicep, tricep, grip 5/5 (LUE slightly limited due to pain) RLE: 4-/5 hip flexor, knee extensor, ankle dorsal flexor LLE: 2/5 hip flexion, 3+/5 KE, (pain inhibition), ankle dorsiflexion/plantar flexion 4+/5  Skin: Skin is warm and dry. She is not diaphoretic.  Hip incision c/d/i Psychiatric: Anxious. Agitated.   Assessment/Plan: 1. Functional deficits secondary to left hip fracture which require 3+ hours per day of interdisciplinary therapy in a comprehensive inpatient rehab setting. Physiatrist is providing close team supervision and 24 hour management of active medical problems listed below. Physiatrist and rehab team continue to assess barriers to discharge/monitor patient progress toward functional and medical goals.  Function:  Bathing Bathing position   Position: Bed  Bathing parts Body parts bathed by patient: Right arm, Left arm, Chest, Abdomen Body parts bathed by helper:  Right upper leg, Left upper leg, Right lower leg, Left lower leg, Front perineal area  Bathing assist Assist Level:  (Max assist)      Upper Body Dressing/Undressing Upper body dressing   What is the patient wearing?: Hospital gown Bra - Perfomed by patient: Thread/unthread right bra strap Bra - Perfomed by helper: Thread/unthread left bra strap, Hook/unhook bra (pull down sports bra) Pull over shirt/dress -  Perfomed by patient: Thread/unthread right sleeve, Thread/unthread left sleeve, Pull shirt over trunk Pull over shirt/dress - Perfomed by helper: Thread/unthread right sleeve, Pull shirt over trunk, Put head through opening        Upper body assist Assist Level:  (max asssit)      Lower Body Dressing/Undressing Lower body dressing   What is the patient wearing?: Non-skid slipper socks   Underwear - Performed by helper: Thread/unthread right underwear leg, Thread/unthread left underwear leg, Pull underwear up/down   Pants- Performed by helper: Thread/unthread right pants leg, Thread/unthread left pants leg, Pull pants up/down Non-skid slipper socks- Performed by patient: Don/doff right sock, Don/doff left sock Non-skid slipper socks- Performed by helper: Don/doff right sock, Don/doff left sock               TED Hose - Performed by helper: Don/doff right TED hose, Don/doff left TED hose  Lower body assist Assist for lower body dressing:  (total assist d/t pain at LLE) Assistive Device Comment: sock-aid    Psychologist, occupational   Toileting steps completed by patient: Adjust clothing prior to toileting, Performs perineal hygiene, Adjust clothing after toileting Toileting steps completed by helper: Adjust clothing prior to toileting, Performs perineal hygiene, Adjust clothing after toileting Toileting Assistive Devices: Grab bar or rail  Toileting assist Assist level: Touching or steadying assistance (Pt.75%)   Transfers Chair/bed transfer   Chair/bed transfer method: Other Chair/bed transfer assist level: Maximal assist (Pt 25 - 49%/lift and lower) Chair/bed transfer assistive device: Mechanical lift Mechanical lift: Stedy   Locomotion Ambulation Ambulation activity did not occur: Safety/medical concerns   Max distance: 15 feet     Wheelchair   Type: Manual Max wheelchair distance: 150 ft Assist Level: Supervision or verbal cues  Cognition Comprehension Comprehension  assist level: Understands complex 90% of the time/cues 10% of the time  Expression Expression assist level: Expresses basic 75 - 89% of the time/requires cueing 10 - 24% of the time. Needs helper to occlude trach/needs to repeat words.  Social Interaction Social Interaction assist level: Interacts appropriately with others with medication or extra time (anti-anxiety, antidepressant).  Problem Solving Problem solving assist level: Solves basic 50 - 74% of the time/requires cueing 25 - 49% of the time  Memory Memory assist level: Recognizes or recalls 50 - 74% of the time/requires cueing 25 - 49% of the time    Medical Problem List and Plan: 1.  Gait abnormality, difficulty with transfers, poor activity tolerance secondary to left hip fracture.  Cont CIR therapies  Xray hip 12/31 reviewed, stable post-surgical 2.  DVT Prophylaxis/Anticoagulation: Mechanical: Sequential compression devices, below knee Bilateral lower extremities Pharmaceutical: Lovenox  Vascular study neg and limited on 12/23 for DVT 3. Pain Management: Premedicate prior to therapy sessions.   Added voltaren gel to left shoulder   Sportscreme to back for chronic back pain.    Scheduled tylenol qid.   Robaxin PRN   ice/heat in addition to oxycodone/robaxin and above 4. Mood: Team to provide ego support. LCSW to follow for evaluation and support.  5. Neuropsych: This  patient is capable of making decisions on his own behalf. 6. Skin/Wound Care: Monitor wound daily for healing  7. Fluids/Electrolytes/Nutrition: Monitor I/O.  8.  ABLA: Added iron supplement.   Hb 8.8 on 12/27  Labs pending  Cont to monitor 9. Abdominal pain/constipation:   KUB reviewed 12/23, unremarkable   Increased miralax to bid.  10. HTN: Monitor bid  PTA Norcasc 5, HCTZ 25  Cont Toprolol 12.5  HCTZ 12.5 started 12/23, on hold until 12/30, due to hypotension 12/27  Controlled 1/2  Will monitor with increased activity 11. AKI on ?CKD: On HCTZ at  home--BUN/Scr 28/1.22 at admission. Encourage fluid intake.    Cr. 1.43 on 1/1  Cont encourage fluid intake  IVF bolused on 12/27, ordered qhs IVF started 1/2  Will cont to monitor 12. Thrombocytopenia: Resolved  Likely reactive.  13. Hypoalbuminemia  Supplement initiated 12/23 14. Leukopenia: ?Resolved   WBCs 4.5 on 1/1  Will consider consult if necessary  Will cont to monitor 15. Left neck/shoulder pain with history of breast CA and mastectomy  U/S negative  Voltaren gel ordered  Xray shoulder reviewed, unremarkable  Premedicate before therapies, discussed with therapies as well.  Scheduled Robaxin, increased dose on 12/29, changed back to PRN due to hallucinations/disorientation on 1/2. Confirmed with nursing.   Cont heat  LOS (Days) 11 A FACE TO FACE EVALUATION WAS PERFORMED  [name] 03/17/2016 8:07 AM

## 2016-03-17 NOTE — Progress Notes (Signed)
Occupational Therapy Session Note  Patient Details  Name: [name] MRN: [id number] Date of Birth: 05-01-1926  Today's Date: 03/17/2016  Session 1 OT Individual Time: 0900-1000 OT Individual Time Calculation (min): 60 min  Session 2 OT Individual Time: 1300-1425 OT Individual Time Calculation (min): 85 min    Short Term Goals: Week 2:  OT Short Term Goal 1 (Week 2): Pt will complete bathing with Min A and AE as needed OT Short Term Goal 2 (Week 2): Pt will complete LB dressing with Mod A and AE as needed OT Short Term Goal 3 (Week 2): Pt will complete toileting tasks with 1 helper OT Short Term Goal 4 (Week 2): Pt will completed sit<>stand with Mod A for self-care tasks at the sink.   Skilled Therapeutic Interventions/Progress Updates:    Session 1 Pt greeted in bed and required encouragement to participate in OT this morning 2/2 pain and fatigue. Bed level bathing completed with set-up and assist for Lb. Pt then cam to sitting EOB for dressing. Pt reported improved L neck/shoulder pain, K-tape removed, and pt able to dress UB with Min A. Socks and TED hose donned dependently .2/2 pain. Pt required assist to thread LLE through pants, then used reacher to thread R LE per OT instruction. 3 attempts at sit<>stand using Rw, successful on 3rd attempt with Max A. Total A to pull pants over hips and secure brief. Max A pivot to w/c with manual facilitation to position LLE. Pt left seated in wc at end of session with needs met and call bell within reach.   Session 2 Pt greeted in w/c finishing lunch upon OT arrival. Pt reported need for bathroom. Max A stand-pivot transfer from w/c to heavy duty BSC. Pt voided bowel and bladder successfully, total A for clothing management and peri-care. Pt attempted to remove unilateral UE from RW to assist with toileting, but unable to complete 2/2 L hip pain. Sit<>stand from [facility] Max A, difficulty w/ pivot back to w/c on L side, requiring OT to bring  w/c behind pt to sit. Pt then brought to therapy apartment for TAG (therapeutic activity group) with 2 other patients accompanied by therapist. Pt participated in Wii bowling with focus on sit<>stands, standing endurance, and standing balance. OT facilitated weight shifting and improved posture with standing activity. Pt tolerated 5 minutes at longest standing bout with overall Max A for sit<>stands. Pt then propelled w/c 1/2 way back to room with increased time and effort. Pt transferred back to supine in bed with Max A and left with needs met.   Therapy Documentation Precautions:  Precautions Precautions: Fall Precaution Comments: L neck pain Restrictions Weight Bearing Restrictions: No LLE Weight Bearing: Weight bearing as tolerated Pain: Pain Assessment Pain Assessment: 0-10 Pain Score: 5  Pain Type: Surgical pain Pain Location: Hip Pain Orientation: Left Pain Descriptors / Indicators: Aching Pain Onset: On-going Pain Intervention(s): Repositioned ADL: ADL ADL Comments: Please see functional navigator for ADL status  See Function Navigator for Current Functional Status.   Therapy/Group: Individual Therapy  [name] 03/17/2016, 3:54 PM

## 2016-03-18 ENCOUNTER — Inpatient Hospital Stay (HOSPITAL_COMMUNITY): Payer: Medicare Other | Admitting: Occupational Therapy

## 2016-03-18 ENCOUNTER — Inpatient Hospital Stay (HOSPITAL_COMMUNITY): Payer: Medicare Other | Admitting: *Deleted

## 2016-03-18 ENCOUNTER — Inpatient Hospital Stay (HOSPITAL_COMMUNITY): Payer: Medicare Other | Admitting: Physical Therapy

## 2016-03-18 MED ORDER — LIDOCAINE 5 % EX PTCH
1.0000 | MEDICATED_PATCH | CUTANEOUS | Status: DC
  Administered 2016-03-18 – 2016-03-26 (×8): 1 via TRANSDERMAL
  Filled 2016-03-18 (×9): qty 1

## 2016-03-18 MED ORDER — BACLOFEN 5 MG HALF TABLET
5.0000 mg | ORAL_TABLET | Freq: Three times a day (TID) | ORAL | Status: DC
  Administered 2016-03-18 – 2016-03-19 (×6): 5 mg via ORAL
  Filled 2016-03-18 (×6): qty 1

## 2016-03-18 NOTE — Progress Notes (Signed)
Recreational Therapy Session Note  Patient Details  Name: MADDUX MEHRTENS MRN: [id number] Date of Birth: 10/22/1926 Today's Date: 03/18/2016  Pain: 9/10 hip pain with movement, premedicated, rest & repositioning offered Skilled Therapeutic Interventions/Progress Updates: Session focused on activity tolerance, standing tolerance/static standing balance & ambulation with RW.  Pt performed sit-stand with min assist and stood for ~5 minutes with 1-2 UE support on RW with supervision and diversional conversation.   After seated rest break, pt ambulated with RW ~15 ft with min assist.    Therapy/Group: Co-Treatment  Newell Wafer 03/18/2016, 1:34 PM

## 2016-03-18 NOTE — Progress Notes (Signed)
[place] PHYSICAL MEDICINE & REHABILITATION     PROGRESS NOTE  Subjective/Complaints: Pt laying in bed this AM.  She does not rememeber telling me to shut up yesterday and is apologetic.  She is more alert and aware this AM.  She states she needs to go slower in therapies.   ROS: Denies CP, SOB, N/V/D.  Objective: Vital Signs: Blood pressure (!) 164/68, pulse 65, temperature 97.6 F (36.4 C), temperature source Oral, resp. rate 16, height 5\' 6"  (1.676 m), weight 98.2 kg (216 lb 9.6 oz), SpO2 94 %. No results found.  Recent Labs  03/16/16 1356  WBC 4.5  HGB 8.3*  HCT 26.8*  PLT 199    Recent Labs  03/16/16 1356 03/17/16 1258  NA 138 138  K 4.2 4.3  CL 102 101  GLUCOSE 119* 103*  BUN 49* 48*  CREATININE 1.43* 1.19*  CALCIUM 9.9 10.1   CBG (last 3)  No results for input(s): GLUCAP in the last 72 hours.  Wt Readings from Last 3 Encounters:  03/18/16 98.2 kg (216 lb 9.6 oz)  03/03/16 90.3 kg (199 lb)  01/15/12 87.4 kg (192 lb 9.6 oz)    Physical Exam:  BP (!) 164/68 (BP Location: Right Leg)   Pulse 65   Temp 97.6 F (36.4 C) (Oral)   Resp 16   Ht 5\' 6"  (1.676 m)   Wt 98.2 kg (216 lb 9.6 oz)   SpO2 94%   BMI 34.96 kg/m  Constitutional: She appears well-developed. No distress. Obese.  HENT: Normocephalic and atraumatic.  Eyes: EOM are normal. No discharge.  Cardiovascular: RRR. No JVD. Respiratory: Effort normal and breath sounds normal.  GI: Soft. Bowel sounds are normal.  Midline and RLQ hernias (stable) Musculoskeletal: left hip tender to palpation and simple movement of leg. Neurological: She is alert and oriented.  Motor: B/l deltoid, bicep, tricep, grip 5/5 (LUE slightly limited due to pain) RLE: 4-/5 hip flexor, knee extensor, ankle dorsal flexor LLE: 2/5 hip flexion, 3+/5 KE, (pain inhibition, stable), ankle dorsiflexion/plantar flexion 4+/5  Skin: Skin is warm and dry. She is not diaphoretic.  Hip incision c/d/i Psychiatric: Normal mood and  affect.   Assessment/Plan: 1. Functional deficits secondary to left hip fracture which require 3+ hours per day of interdisciplinary therapy in a comprehensive inpatient rehab setting. Physiatrist is providing close team supervision and 24 hour management of active medical problems listed below. Physiatrist and rehab team continue to assess barriers to discharge/monitor patient progress toward functional and medical goals.  Function:  Bathing Bathing position   Position: Bed  Bathing parts Body parts bathed by patient: Right arm, Left arm, Chest, Abdomen, Front perineal area, Right upper leg, Left upper leg Body parts bathed by helper: Left lower leg, Right lower leg, Buttocks, Back  Bathing assist Assist Level: Touching or steadying assistance(Pt > 75%), Set up   Set up : To obtain items  Upper Body Dressing/Undressing Upper body dressing   What is the patient wearing?: Pull over shirt/dress Bra - Perfomed by patient: Thread/unthread right bra strap, Thread/unthread left bra strap Bra - Perfomed by helper: Hook/unhook bra (pull down sports bra) Pull over shirt/dress - Perfomed by patient: Thread/unthread right sleeve, Thread/unthread left sleeve, Put head through opening, Pull shirt over trunk Pull over shirt/dress - Perfomed by helper: Thread/unthread right sleeve, Pull shirt over trunk, Put head through opening        Upper body assist Assist Level: Touching or steadying assistance(Pt > 75%)  Lower Body Dressing/Undressing Lower body dressing   What is the patient wearing?: Pants, Non-skid slipper socks, Ted Hose   Underwear - Performed by helper: Thread/unthread right underwear leg, Thread/unthread left underwear leg, Pull underwear up/down Pants- Performed by patient: Thread/unthread right pants leg Pants- Performed by helper: Pull pants up/down, Thread/unthread left pants leg Non-skid slipper socks- Performed by patient: Don/doff right sock, Don/doff left sock Non-skid  slipper socks- Performed by helper: Don/doff right sock, Don/doff left sock               TED Hose - Performed by helper: Don/doff right TED hose, Don/doff left TED hose  Lower body assist Assist for lower body dressing:  (Max A) Assistive Device Comment: Engineer, maintenance (IT)   Toileting steps completed by patient: Adjust clothing prior to toileting, Performs perineal hygiene, Adjust clothing after toileting Toileting steps completed by helper: Adjust clothing prior to toileting, Performs perineal hygiene, Adjust clothing after toileting Toileting Assistive Devices: Grab bar or rail  Toileting assist Assist level: Touching or steadying assistance (Pt.75%)   Transfers Chair/bed transfer   Chair/bed transfer method: Other Chair/bed transfer assist level: Maximal assist (Pt 25 - 49%/lift and lower) Chair/bed transfer assistive device: Mechanical lift Mechanical lift: Stedy   Locomotion Ambulation Ambulation activity did not occur: Safety/medical concerns   Max distance: 15 feet     Wheelchair   Type: Manual Max wheelchair distance: 150 ft Assist Level: Supervision or verbal cues  Cognition Comprehension Comprehension assist level: Understands complex 90% of the time/cues 10% of the time  Expression Expression assist level: Expresses complex 90% of the time/cues < 10% of the time  Social Interaction Social Interaction assist level: Interacts appropriately with others with medication or extra time (anti-anxiety, antidepressant).  Problem Solving Problem solving assist level: Solves basic 90% of the time/requires cueing < 10% of the time  Memory Memory assist level: Recognizes or recalls 75 - 89% of the time/requires cueing 10 - 24% of the time    Medical Problem List and Plan: 1.  Gait abnormality, difficulty with transfers, poor activity tolerance secondary to left hip fracture.  Cont CIR therapies  Xray hip 12/31 reviewed, stable post-surgical 2.  DVT  Prophylaxis/Anticoagulation: Mechanical: Sequential compression devices, below knee Bilateral lower extremities Pharmaceutical: Lovenox  Vascular study neg and limited on 12/23 for DVT 3. Pain Management: Premedicate prior to therapy sessions.   Added voltaren gel to left shoulder   Sportscreme to back for chronic back pain.    Scheduled tylenol qid.   Robaxin PRN   ice/heat in addition to oxycodone/robaxin and above  Lidoderm patch added 1/3  Baclofen 5 TID started 1/3 4. Mood: Team to provide ego support. LCSW to follow for evaluation and support.  5. Neuropsych: This patient is capable of making decisions on his own behalf. 6. Skin/Wound Care: Monitor wound daily for healing  7. Fluids/Electrolytes/Nutrition: Monitor I/O.  8.  ABLA: Added iron supplement.   Hb 8.3 on 1/1  Cont to monitor 9. Abdominal pain/constipation:   KUB reviewed 12/23, unremarkable   Increased miralax to bid.  10. HTN: Monitor bid  PTA Norcasc 5, HCTZ 25  Cont Toprolol 12.5  HCTZ 12.5 started 12/23, on hold until 12/30, due to hypotension 12/27  Elevated this AM, otherwise controlled 1/3  Will monitor with increased activity 11. AKI on ?CKD: On HCTZ at home--BUN/Scr 28/1.22 at admission. Encourage fluid intake.    Cr. 1.19 on 1/2  Cont encourage fluid intake  IVF bolused  on 12/27, ordered qhs IVF started 1/2, will consider d/c at end of the week  Will cont to monitor 12. Thrombocytopenia: Resolved  Likely reactive.  13. Hypoalbuminemia  Supplement initiated 12/23 14. Leukopenia: ?Resolved   WBCs 4.5 on 1/1  Will consider consult if necessary  Will cont to monitor 15. Left neck/shoulder pain with history of breast CA and mastectomy  U/S negative  Voltaren gel ordered  Xray shoulder reviewed, unremarkable  Premedicate before therapies, discussed with therapies as well.  Scheduled Robaxin, increased dose on 12/29, changed back to PRN due to hallucinations/disorientation on 1/2, improved.  Cont  heat  LOS (Days) 12 A FACE TO FACE EVALUATION WAS PERFORMED  [name] [name] 03/18/2016 8:30 AM

## 2016-03-18 NOTE — Progress Notes (Signed)
Physical Therapy Session Note  Patient Details  Name: [name] MRN: [id number] Date of Birth: [birth year]  Today's Date: 03/18/2016 PT Individual Time: 1005-1100 and 1430-1530 PT Individual Time Calculation (min): 55 min and 60 min   Short Term Goals: Week 2:  PT Short Term Goal 1 (Week 2): Patient will perform bed mobility min A with hospital bed functions.  PT Short Term Goal 2 (Week 2): Pt will perform sit <>stand transfers with mod A.  PT Short Term Goal 3 (Week 2): Patient will initiate gait training.  Skilled Therapeutic Interventions/Progress Updates:    Treatment 1: Patient in wheelchair upon arrival. Session focused on wheelchair propulsion to and from gym using BUE with supervision and increased time, sit <> stand transfers using RW with mod A progressed to min A, static standing tolerance with bilateral or single UE support x 5 min with supervision, initiation of gait training using RW x 15 ft in straight path with min A, and stand step transfer using RW from wheelchair to and from mat table with min A. Patient demonstrating significant improvement in activity tolerance and functional mobility this date. Patient left sitting in wheelchair with all needs within reach.    Treatment 2: Patient in wheelchair upon arrival reporting L hip soreness and pain. Patient propelled wheelchair to and from gym using RW with supervision and increased time. Performed stand pivot transfer using RW from wheelchair to and from NuStep with mod A for sit <> stand. Performed NuStep using BLE and RUE only due to LUE pain at level 3 x 10 min within available ROM to address LLE ROM and strengthening. Patient requested to return to bed due to fatigue. Performed stand pivot transfer using RW with mod A, changed into gown with min A sit <> stand from bed to doff pants, and transferred sit > supine with min A to lift LLE onto bed. Patient left semi reclined in bed with bed alarm on and all needs within  reach.   Therapy Documentation Precautions:  Precautions Precautions: Fall Precaution Comments: L neck pain Restrictions Weight Bearing Restrictions: No LLE Weight Bearing: Weight bearing as tolerated Pain: Pain Assessment Pain Assessment: 0-10 Pain Score: 9  Pain Type: Acute pain Pain Location: Hip Pain Orientation: Left Pain Descriptors / Indicators: Aching Pain Onset: With Activity Pain Intervention(s): Repositioned;Rest   See Function Navigator for Current Functional Status.   Therapy/Group: Individual Therapy  Eduar Kumpf, Murray Hodgkins 03/18/2016, 10:54 AM
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
## 2016-03-18 NOTE — Progress Notes (Signed)
Occupational Therapy Session Note  Patient Details  Name: [name] MRN: [id number] Date of Birth: 07/12/1926  Today's Date: 03/18/2016  Session 1 OT Individual Time: 0900-1000 OT Individual Time Calculation (min): 60 min   Session 2 OT Individual Time: 1330-1400 OT Individual Time Calculation (min): 30 min    Short Term Goals: Week 2:  OT Short Term Goal 1 (Week 2): Pt will complete bathing with Min A and AE as needed OT Short Term Goal 2 (Week 2): Pt will complete LB dressing with Mod A and AE as needed OT Short Term Goal 3 (Week 2): Pt will complete toileting tasks with 1 helper OT Short Term Goal 4 (Week 2): Pt will completed sit<>stand with Mod A for self-care tasks at the sink.   Skilled Therapeutic Interventions/Progress Updates:    Session 1 1:1 OT session focused on sit<>stands, transfer training, and increased independence with bathing/dressing. Pt greeted in bed with RN finishing dependent peri-care after BM on bedpan. Discussed importance of trying to get to [facility] for toileting, but pt reported urgency issues. Sup<>sit w/ Max A. Pt tolerated 20 minutes seated EOB for bathing. Reacher utilized to thread B LE's with instructional cues for technique. Stand-pivot transfer w/ RW + Max A sit<>stand and Mod A pivot. Grooming completed at the sink with rest breaks 2/2 pain and fatigue. Pt left seated in w/c at end of session with needs met.   Session 2 1:1 OT session focused on transfer training and toileting. Pt completed stand-pivot transfer <> BSC with Max A to stand and Mod A to pivot. Pt voided successfully, then worked on toileting strategies in sitting and standing. Pt able to assist with peri-care, but required assist to wash buttocks and manage clothing. Stand-pivot back to w/c w/ Min A stand, and Min A pivot to L. Adjusted pain patch on L shoulder and RN entered room to administer pain medication. Pt left seated in wc with needs met.   Therapy  Documentation Precautions:  Precautions Precautions: Fall Precaution Comments: L neck pain Restrictions Weight Bearing Restrictions: No LLE Weight Bearing: Weight bearing as tolerated Pain: Pain Assessment Pain Assessment: 0-10 Pain Score: 5  Pain Type: Acute pain Pain Location: Hip Pain Orientation: Left Pain Descriptors / Indicators: Aching Pain Onset: With Activity Pain Intervention(s): Repositioned ADL: ADL ADL Comments: Please see functional navigator for ADL status  See Function Navigator for Current Functional Status.   Therapy/Group: Individual Therapy  [name] 03/18/2016, 2:25 PM
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
## 2016-03-18 NOTE — Progress Notes (Signed)
Social Work Patient ID: [name], female   DOB: June 14, 1926, 83 y.o.   MRN: [id number]  Spoke with Lisa-daughter to discuss team conference goals supervision level and target discharge date 1/13. She reports they all work and can not be there during the day but can be there at night. Discussed her Mom needs someone with her to be the safest. [name] discussed having her go to a NH for a short time And then after that go home. She will talk with pt about this option and get back with this worker. Pt has been to [name] before after surgery. Will also discuss with pt and work on the safest plan for pt.

## 2016-03-19 ENCOUNTER — Inpatient Hospital Stay (HOSPITAL_COMMUNITY): Payer: Medicare Other | Admitting: Occupational Therapy

## 2016-03-19 ENCOUNTER — Inpatient Hospital Stay (HOSPITAL_COMMUNITY): Payer: Medicare Other | Admitting: Physical Therapy

## 2016-03-19 DIAGNOSIS — R0989 Other specified symptoms and signs involving the circulatory and respiratory systems: Secondary | ICD-10-CM | POA: Insufficient documentation

## 2016-03-19 MED ORDER — LIDOCAINE 5 % EX PTCH
1.0000 | MEDICATED_PATCH | CUTANEOUS | Status: DC
  Administered 2016-03-19 – 2016-03-27 (×9): 1 via TRANSDERMAL
  Filled 2016-03-19 (×10): qty 1

## 2016-03-19 NOTE — Progress Notes (Signed)
Occupational Therapy Weekly Progress Note  Patient Details  Name: [name] MRN: [id number] Date of Birth: 04-Aug-1926  Beginning of progress report period: March 07, 2016 End of progress report period: March 19, 2016  Today's Date: 03/19/2016 OT Individual Time: 1400-1455 OT Individual Time Calculation (min): 55 min    Patient has met 4 of 4 short term goals.  Pt is making steady progress with OT treatments at this time. She is able to complete stand-pivot transfer with RW and overall Min/Mod A. She has improved to overall Min A level for bathing tasks with use of ADL Ae.  She continues to need increased time ADLs 2/2 pain LLE pain, intermittent L UE pain, and fatigue. Will continue with current OT treatment POC.     Patient continues to demonstrate the following deficits: muscle weakness and muscle joint tightness and decreased standing balance and decreased balance strategies and therefore will continue to benefit from skilled OT intervention to enhance overall performance with BADL.  Patient progressing toward long term goals..  Continue plan of care.  OT Short Term Goals Week 3:  OT Short Term Goal 1 (Week 3): Pt wil complete stand-piovt transfers consistently with Min A OT Short Term Goal 2 (Week 3): Pt will complete LB dressing with Min A consistently with use of ADL AE OT Short Term Goal 3 (Week 3): Pt will tolerate 6 minutes of standing activities in preparation for self-care tasks   Skilled Therapeutic Interventions/Progress Updates:    1:1 OT session focused on LB dressing, transfer training, sit<>stands, and activity tolerance. Pt greeted asleep in bed and agreeable to OT . Pt transferred to EOB with overall Mod A 2/2 LLE and L shoulder pain. Demonstrated use of long-handled shoe horn to don shoes. Pt able to don R shoe with set-up, but required Mod A for LLE 2/2 L hip pain. Stand-pivot transfers with overall Mod A w/ difficulty weight bearing through L leg in  order to pivot R foot. Pt brought to tub room and discussed home bathroom set-up and use of tub-transfer bench. OT demonstrated transfer, then pt completed with Mod A to pivot and Mod A to lift LLE in and out of tub. Pt required extended rest break s/p transfer 2/2 increase pain. Pt returned to room at end of session and left with warm blanket on and needs met.    Therapy Documentation Precautions:  Precautions Precautions: Fall Precaution Comments: L neck pain Restrictions Weight Bearing Restrictions: No LLE Weight Bearing: Weight bearing as tolerated Pain: Pain Assessment Pain Assessment: 0-10 Pain Score: 4  Pain Type: Acute pain Pain Location: Hip Pain Orientation: Left Pain Descriptors / Indicators: Aching Pain Onset: With Activity Pain Intervention(s): Repositioned ADL: ADL ADL Comments: Please see functional navigator for ADL status  See Function Navigator for Current Functional Status.   Therapy/Group: Individual Therapy  [name] 03/19/2016, 10:15 AM

## 2016-03-19 NOTE — Progress Notes (Signed)
[facility]IC[facility] RE[facility]ILITATION     PROGRESS NOTE  Subjective/Complaints: Pt seen laying in bed this AM.  Her left shoulder/neck pain is improving.  She slept well overnight.   ROS: Denies CP, SOB, N/V/D.  Objective: Vital Signs: Blood pressure (!) 176/79, pulse 70, temperature 97.9 F (36.6 C), temperature source Oral, resp. rate 18, height 5\' 6"  (1.676 m), weight 98.2 kg (216 lb 9.6 oz), SpO2 98 %. No results found.  Recent Labs  03/16/16 1356  WBC 4.5  HGB 8.3*  HCT 26.8*  PLT 199    Recent Labs  03/16/16 1356 03/17/16 1258  NA 138 138  K 4.2 4.3  CL 102 101  GLUCOSE 119* 103*  BUN 49* 48*  CREATININE 1.43* 1.19*  CALCIUM 9.9 10.1   CBG (last 3)  No results for input(s): GLUCAP in the last 72 hours.  Wt Readings from Last 3 Encounters:  03/18/16 98.2 kg (216 lb 9.6 oz)  03/03/16 90.3 kg (199 lb)  01/15/12 87.4 kg (192 lb 9.6 oz)    Physical Exam:  BP (!) 176/79 (BP Location: Right Arm) Comment: RN notifed  Pulse 70   Temp 97.9 F (36.6 C) (Oral)   Resp 18   Ht 5\' 6"  (1.676 m)   Wt 98.2 kg (216 lb 9.6 oz)   SpO2 98%   BMI 34.96 kg/m  Constitutional: She appears well-developed. No distress. Obese.  HENT: Normocephalic and atraumatic.  Eyes: EOM are normal. No discharge.  Cardiovascular: RRR. No JVD. Respiratory: Effort normal and breath sounds normal.  GI: Soft. Bowel sounds are normal.  Midline and RLQ hernias (unchanged) Musculoskeletal: left hip tender to palpation and simple movement of leg. Neurological: She is alert and oriented.  Motor: B/l deltoid, bicep, tricep, grip 5/5  RLE: 4/5 hip flexor, knee extensor, ankle dorsal flexor LLE: 2+/5 hip flexion, 3+/5 KE, (pain inhibition, ankle dorsiflexion/plantar flexion 4+/5  Skin: Skin is warm and dry. She is not diaphoretic.  Hip incision c/d/i Psychiatric: Normal mood and affect.   Assessment/Plan: 1. Functional deficits secondary to left hip fracture which require 3+ hours per  day of interdisciplinary therapy in a comprehensive inpatient rehab setting. Physiatrist is providing close team supervision and 24 hour management of active medical problems listed below. Physiatrist and rehab team continue to assess barriers to discharge/monitor patient progress toward functional and medical goals.  Function:  Bathing Bathing position   Position: Sitting EOB  Bathing parts Body parts bathed by patient: Right arm, Left arm, Chest, Abdomen Body parts bathed by helper: Left lower leg, Right lower leg, Buttocks, Back  Bathing assist Assist Level: Supervision or verbal cues, Set up   Set up : To obtain items  Upper Body Dressing/Undressing Upper body dressing   What is the patient wearing?: Pull over shirt/dress Bra - Perfomed by patient: Thread/unthread right bra strap, Thread/unthread left bra strap Bra - Perfomed by helper: Hook/unhook bra (pull down sports bra) Pull over shirt/dress - Perfomed by patient: Thread/unthread right sleeve, Thread/unthread left sleeve, Put head through opening, Pull shirt over trunk Pull over shirt/dress - Perfomed by helper: Thread/unthread right sleeve, Pull shirt over trunk, Put head through opening        Upper body assist Assist Level: Supervision or verbal cues, Set up   Set up : To obtain clothing/put away  Lower Body Dressing/Undressing Lower body dressing   What is the patient wearing?: Pants, Shoes, [employer] - Performed by helper: Thread/unthread right underwear  leg, Thread/unthread left underwear leg, Pull underwear up/down Pants- Performed by patient: Thread/unthread right pants leg, Thread/unthread left pants leg Pants- Performed by helper: Pull pants up/down Non-skid slipper socks- Performed by patient: Don/doff right sock, Don/doff left sock Non-skid slipper socks- Performed by helper: Don/doff right sock, Don/doff left sock               TED Hose - Performed by helper: Don/doff right TED hose,  Don/doff left TED hose  Lower body assist Assist for lower body dressing:  (Mod  A) Assistive Device Comment: Engineer, maintenance (IT)   Toileting steps completed by patient: Adjust clothing prior to toileting Toileting steps completed by helper: Performs perineal hygiene, Adjust clothing after toileting Toileting Assistive Devices: Grab bar or rail  Toileting assist Assist level: Touching or steadying assistance (Pt.75%)   Transfers Chair/bed transfer   Chair/bed transfer method: Stand pivot Chair/bed transfer assist level: Touching or steadying assistance (Pt > 75%) Chair/bed transfer assistive device: Armrests, Environmental manager lift: Architectural technologist activity did not occur: Safety/medical concerns   Max distance: 15 feet Assist level: Touching or steadying assistance (Pt > 75%)   Wheelchair   Type: Manual Max wheelchair distance: 150 ft Assist Level: Supervision or verbal cues  Cognition Comprehension Comprehension assist level: Understands complex 90% of the time/cues 10% of the time  Expression Expression assist level: Expresses complex 90% of the time/cues < 10% of the time  Social Interaction Social Interaction assist level: Interacts appropriately with others with medication or extra time (anti-anxiety, antidepressant).  Problem Solving Problem solving assist level: Solves basic problems with no assist  Memory Memory assist level: Recognizes or recalls 75 - 89% of the time/requires cueing 10 - 24% of the time    Medical Problem List and Plan: 1.  Gait abnormality, difficulty with transfers, poor activity tolerance secondary to left hip fracture.  Cont CIR therapies  Xray hip 12/31 reviewed, stable post-surgical 2.  DVT Prophylaxis/Anticoagulation: Mechanical: Sequential compression devices, below knee Bilateral lower extremities Pharmaceutical: Lovenox  Vascular study neg and limited on 12/23 for DVT 3. Pain Management: Premedicate  prior to therapy sessions.   Added voltaren gel to left shoulder   Sportscreme to back for chronic back pain.    Scheduled tylenol qid.   Robaxin PRN   ice/heat in addition to oxycodone/robaxin and above  Lidoderm patch added 1/3 to shoulder/neck, hip 1/4  Baclofen 5 TID started 1/3 4. Mood: Team to provide ego support. LCSW to follow for evaluation and support.  5. Neuropsych: This patient is capable of making decisions on his own behalf. 6. Skin/Wound Care: Monitor wound daily for healing  7. Fluids/Electrolytes/Nutrition: Monitor I/O.  8.  ABLA: Added iron supplement.   Hb 8.3 on 1/1  Cont to monitor 9. Abdominal pain/constipation:   KUB reviewed 12/23, unremarkable   Increased miralax to bid.  10. HTN: Monitor bid  PTA Norcasc 5, HCTZ 25  Cont Toprolol 12.5  HCTZ 12.5 started 12/23, on hold until 12/30, due to hypotension 12/27  Very labile with significant elevations in AM, likely secondary to IVF, will d/c IVF  Will monitor with increased activity 11. AKI on ?CKD: On HCTZ at home--BUN/Scr 28/1.22 at admission. Encourage fluid intake.    Cr. 1.19 on 1/2  Cont encourage fluid intake  IVF bolused on 12/27, ordered qhs IVF started 1/2, d/ced 1/4  Will cont to monitor 12. Thrombocytopenia: Resolved  Likely reactive.  13. Hypoalbuminemia  Supplement initiated 12/23  14. Leukopenia: ?Resolved   WBCs 4.5 on 1/1  Will consider consult if necessary  Will cont to monitor 15. Left neck/shoulder pain with history of breast CA and mastectomy  U/S negative  Voltaren gel ordered  Xray shoulder reviewed, unremarkable  Premedicate before therapies, discussed with therapies as well.  Scheduled Robaxin, increased dose on 12/29, changed back to PRN due to hallucinations/disorientation on 1/2, resolved.  Cont heat  LOS (Days) 13 A FACE TO FACE EVALUATION WAS PERFORMED  Ankit Lorie Phenix 03/19/2016 8:35 AM

## 2016-03-19 NOTE — Discharge Instructions (Signed)
Inpatient Rehab Discharge Instructions  LUVERNA JOHANNES Discharge date and time:    Activities/Precautions/ Functional Status: Activity: no lifting, driving, or strenuous exercise till cleared by MD Diet: regular diet Wound Care: keep wound clean and dry   Functional status:  ___ No restrictions     ___ Walk up steps independently _X__ 24/7 supervision/assistance   ___ Walk up steps with assistance ___ Intermittent supervision/assistance  ___ Bathe/dress independently ___ Walk with walker     _X__ Bathe/dress with assistance ___ Walk Independently    ___ Shower independently ___ Walk with assistance    ___ Shower with assistance _X__ No alcohol     ___ Return to work/school ________  Special Instructions:    My questions have been answered and I understand these instructions. I will adhere to these goals and the provided educational materials after my discharge from the hospital.  Patient/Caregiver Signature _______________________________ Date __________  Clinician Signature _______________________________________ Date __________  Please bring this form and your medication list with you to all your follow-up doctor's appointments.

## 2016-03-19 NOTE — Progress Notes (Signed)
Physical Therapy Weekly Progress Note  Patient Details  Name: [name] MRN: [id number] Date of Birth: May 21, 1926  Beginning of progress report period: March 13, 2016 End of progress report period: March 19, 2016  Today's Date: 03/19/2016 PT Individual Time: 0800-0900 and 1500-1526 PT Individual Time Calculation (min): 60 min and 26 min  Patient has met 2 of 3 short term goals.  Patient making good progress since yesterday with functional mobility due to improved pain in L anterior chest/neck and ability to tolerate WB LLE but continues to fluctuate depending on pain level and time of day.   Patient continues to demonstrate the following deficits muscle weakness and muscle joint tightness, decreased cardiorespiratoy endurance and decreased standing balance, decreased balance strategies and pain and therefore will continue to benefit from skilled PT intervention to increase functional independence with mobility.  Patient progressing toward long term goals. Continue plan of care.  PT Short Term Goals Week 2:  PT Short Term Goal 1 (Week 2): Patient will perform bed mobility min A with hospital bed functions.  PT Short Term Goal 1 - Progress (Week 2): Not met PT Short Term Goal 2 (Week 2): Pt will perform sit <>stand transfers with mod A.  PT Short Term Goal 2 - Progress (Week 2): Met PT Short Term Goal 3 (Week 2): Patient will initiate gait training. PT Short Term Goal 3 - Progress (Week 2): Met Week 3:  PT Short Term Goal 1 (Week 3): = LTGs due to anticipated LOS  Skilled Therapeutic Interventions/Progress Updates:    Treatment 1: Patient in bed upon arrival in same position left by PT after yesterday's session. Patient re-educated on using BSC and decreasing use of bed pan as she continually reports increased hip pain and feeling stiff and sore in the morning, most likely due to not getting OOB or moving for long periods of time, patient verbalized understanding. Supine bed  level therex for BLE strengthening and ROM: ankle pumps, quad sets and glute sets with 3 sec hold, heel slides and SLR with assist for LLE, 20 reps each LE per exercise. Patient sat EOB using rail with HOB raised with increased time and effort with mod A overall to scoot chuck pad to EOB and elevate trunk due to LUE/LLE pain. Patient required more than reasonable time to transfer sit > stand from EOB as she was crying out in pain. Eventually transferred sit > stand from EOB using RW with max A and ambulated short distance to wheelchair with min A. Patient setup at sink for oral care from wheelchair level with episode of emesis and c/o headache, RN notified. Patient required increased time to complete all tasks this date due to pain. Patient left sitting in wheelchair for OT session.   Treatment 2: Patient in wheelchair in room by window after OT session. Patient required more than reasonable time and supervision to navigate around bed and propel wheelchair to gym using BUE in controlled environment. Performed sit <> stand with mod A using RW. Gait training using RW x 7 ft with antalgic step-to pattern and steady assist, terminated due to increased LLE pain. Patient required increased time to complete all tasks due to pain and fatigue. Patient left sitting in wheelchair with family in room.   Therapy Documentation Precautions:  Precautions Precautions: Fall Precaution Comments: L neck pain Restrictions Weight Bearing Restrictions: Yes LLE Weight Bearing: Weight bearing as tolerated Pain:  Unrated LUE and LLE pain, provided rest  breaks   See Function  Navigator for Current Functional Status.  Therapy/Group: Individual Therapy  Porter Moes, Murray Hodgkins 03/19/2016, 9:38 AM

## 2016-03-19 NOTE — Plan of Care (Signed)
Problem: RH PAIN MANAGEMENT Goal: RH STG PAIN MANAGED AT OR BELOW PT'S PAIN GOAL <5  Outcome: Progressing Controlled with scheduled pain med.

## 2016-03-19 NOTE — Progress Notes (Signed)
Occupational Therapy Session Note  Patient Details  Name: [name] MRN: [id number] Date of Birth: 1926-11-22  Today's Date: 03/19/2016 OT Individual Time: 0901-1001 OT Individual Time Calculation (min): 60 min   Short Term Goals: Week 2:  OT Short Term Goal 1 (Week 2): Pt will complete bathing with Min A and AE as needed OT Short Term Goal 2 (Week 2): Pt will complete LB dressing with Mod A and AE as needed OT Short Term Goal 3 (Week 2): Pt will complete toileting tasks with 1 helper OT Short Term Goal 4 (Week 2): Pt will completed sit<>stand with Mod A for self-care tasks at the sink.   Skilled Therapeutic Interventions/Progress Updates:    Pt greeted seated in w.c at sink after PT session. Pt w/ episode of emesis brushing her teeth. RN provided pt with gingerale and pt reported no further nausea. Modified bathing/dressing completed w/ sit<>stands at the sink. Demonstrated use of long-handled sponge and pt able to bathe LE's today. Sit<>stands w/ overall Min/Mod A. Pt able to remove unilateral UE from sink today to assist with washing peri-area and buttocks. Episode of bladder incontinence in standing requiring total A for urinal placement. Pt able to thread B LE's through pant legs with verbal cues and reacher. Tolerated ~ 4 minutes at longest standing bout. Pt with improved pain tolerance today and able to participate more in ADLs than previous sessions. Pt left in wc with needs met.   Therapy Documentation Precautions:  Precautions Precautions: Fall Precaution Comments: L neck pain Restrictions Weight Bearing Restrictions: No LLE Weight Bearing: Weight bearing as tolerated Pain: Pain Assessment Pain Assessment: 0-10 Pain Score: 4  Pain Type: Acute pain Pain Location: Hip Pain Orientation: Left Pain Descriptors / Indicators: Aching Pain Onset: With Activity Pain Intervention(s): Repositioned ADL: ADL ADL Comments: Please see functional navigator for ADL  status  See Function Navigator for Current Functional Status.   Therapy/Group: Individual Therapy  [name] 03/19/2016, 10:15 AM

## 2016-03-20 ENCOUNTER — Inpatient Hospital Stay (HOSPITAL_COMMUNITY): Payer: Medicare Other | Admitting: Occupational Therapy

## 2016-03-20 ENCOUNTER — Inpatient Hospital Stay (HOSPITAL_COMMUNITY): Payer: Medicare Other | Admitting: Physical Therapy

## 2016-03-20 MED ORDER — BACLOFEN 5 MG HALF TABLET
5.0000 mg | ORAL_TABLET | Freq: Three times a day (TID) | ORAL | Status: DC
  Administered 2016-03-21 – 2016-03-27 (×19): 5 mg via ORAL
  Filled 2016-03-20 (×19): qty 1

## 2016-03-20 MED ORDER — BACLOFEN 10 MG PO TABS
10.0000 mg | ORAL_TABLET | Freq: Three times a day (TID) | ORAL | Status: DC
  Administered 2016-03-20: 10 mg via ORAL
  Filled 2016-03-20: qty 1

## 2016-03-20 NOTE — Progress Notes (Signed)
Occupational Therapy Session Note  Patient Details  Name: [name] MRN: [id number] Date of Birth: [birth year]  Today's Date: 03/20/2016  Session 1 OT Individual Time: 3779-3968 OT Individual Time Calculation (min): 58 min   Session 2 OT Individual Time: 1345-1429 OT Individual Time Calculation (min): 44 min   Skilled Therapeutic Interventions/Progress Updates:  Session 1   1:1 OT session focused on LB dressing modifications, sit<>stands, activity tolerance, and LB strengthening. Pt greeted in w.c with RN administering pain meds prior to therapy. OT reviewed use of sock-aid and long-handled shoe horn for LB dressing. Pt demonstrated ability to don both socks with min cues for successful technique. Pt brought to therapy gym and completed 2 sit<>stands with Min/Mod A,  attempted standing marches, pt able to complete w/ LLE, but unable to weight bear through LLE enough to bring R LE off of floor.   Session 2 Pt greeted in w.c reporting need for bathroom. Pt brought into bathroom w/ w/c and completed stand-pivot transfer from w/c to [facility] over regular toilet with use of grab bars. Min guard A for transfer. Pt successfully voided bowel and bladder and was able to manage clothing before and after transfer with assist for toileting for thoroughness. Grooming completed at the sink, then stand-pivot to return to bed for bed level there-ex. Hip Abduction and heel slides 3 sets x10. Pt left semi-reclined in bed with needs met.   Therapy Documentation Precautions:  Precautions Precautions: Fall Precaution Comments: L neck pain Restrictions Weight Bearing Restrictions: Yes LLE Weight Bearing: Weight bearing as tolerated Pain: Pain Assessment Pain Assessment: 0-10 Pain Score: 10-Worst pain ever Pain Type: Acute pain Pain Location: Hip Pain Orientation: Left Pain Descriptors / Indicators: Aching Pain Onset: With Activity Pain Intervention(s): Elevated  extremity;Repositioned ADL: ADL ADL Comments: Please see functional navigator for ADL status  See Function Navigator for Current Functional Status.   Therapy/Group: Individual Therapy  [name] 03/20/2016, 3:40 PM

## 2016-03-20 NOTE — Progress Notes (Signed)
[facility] PHYSICAL MEDICINE & REHABILITATION     PROGRESS NOTE  Subjective/Complaints: Pt seen laying in bed this AM.  She slept well overnight, but states that she is worn out from therapies yesterday.  She states the lidoderm patch to her hip helped "a little".   ROS: Denies CP, SOB, N/V/D.  Objective: Vital Signs: Blood pressure (!) 128/57, pulse 65, temperature 98.5 F (36.9 C), temperature source Oral, resp. rate 17, height 5\' 6"  (1.676 m), weight 98.2 kg (216 lb 9.6 oz), SpO2 97 %. No results found. No results for input(s): WBC, HGB, HCT, PLT in the last 72 hours.  Recent Labs  03/17/16 1258  NA 138  K 4.3  CL 101  GLUCOSE 103*  BUN 48*  CREATININE 1.19*  CALCIUM 10.1   CBG (last 3)  No results for input(s): GLUCAP in the last 72 hours.  Wt Readings from Last 3 Encounters:  03/18/16 98.2 kg (216 lb 9.6 oz)  03/03/16 90.3 kg (199 lb)  01/15/12 87.4 kg (192 lb 9.6 oz)    Physical Exam:  BP (!) 128/57 (BP Location: Right Arm)   Pulse 65   Temp 98.5 F (36.9 C) (Oral)   Resp 17   Ht 5\' 6"  (1.676 m)   Wt 98.2 kg (216 lb 9.6 oz)   SpO2 97%   BMI 34.96 kg/m  Constitutional: She appears well-developed. No distress. Obese.  HENT: Normocephalic and atraumatic.  Eyes: EOM are normal. No discharge.  Cardiovascular: RRR. No JVD. Respiratory: Effort normal and breath sounds normal.  GI: Soft. Bowel sounds are normal.  Midline and RLQ hernias (unchanged) Musculoskeletal: left hip tender to palpation and simple movement of leg. Neurological: She is alert and oriented.  Motor: B/l deltoid, bicep, tricep, grip 5/5  RLE: 4/5 hip flexor, knee extensor, ankle dorsal flexor LLE: 2+/5 hip flexion, 3+/5 KE, (pain inhibition, stable), ankle dorsiflexion/plantar flexion 4+/5  Skin: Skin is warm and dry. She is not diaphoretic.  Hip incision c/d/i Psychiatric: Normal mood and affect.   Assessment/Plan: 1. Functional deficits secondary to left hip fracture which require 3+  hours per day of interdisciplinary therapy in a comprehensive inpatient rehab setting. Physiatrist is providing close team supervision and 24 hour management of active medical problems listed below. Physiatrist and rehab team continue to assess barriers to discharge/monitor patient progress toward functional and medical goals.  Function:  Bathing Bathing position   Position: Wheelchair/chair at sink  Bathing parts Body parts bathed by patient: Right arm, Left arm, Chest, Abdomen, Front perineal area, Right upper leg, Left upper leg, Right lower leg, Left lower leg Body parts bathed by helper: Back, Buttocks  Bathing assist Assist Level: Touching or steadying assistance(Pt > 75%), Set up, Assistive device Assistive Device Comment: long-handled sponge Set up : To obtain items, To adjust water temperature  Upper Body Dressing/Undressing Upper body dressing   What is the patient wearing?: Bra, Pull over shirt/dress Bra - Perfomed by patient: Thread/unthread right bra strap, Thread/unthread left bra strap, Hook/unhook bra (pull down sports bra) Bra - Perfomed by helper: Hook/unhook bra (pull down sports bra) Pull over shirt/dress - Perfomed by patient: Thread/unthread right sleeve, Thread/unthread left sleeve, Put head through opening, Pull shirt over trunk Pull over shirt/dress - Perfomed by helper: Thread/unthread right sleeve, Pull shirt over trunk, Put head through opening        Upper body assist Assist Level: Set up, Supervision or verbal cues   Set up : To obtain clothing/put away  Lower  Body Dressing/Undressing Lower body dressing   What is the patient wearing?: Pants, Ted Hose, Shoes   Underwear - Performed by helper: Thread/unthread right underwear leg, Thread/unthread left underwear leg, Pull underwear up/down Pants- Performed by patient: Thread/unthread right pants leg, Thread/unthread left pants leg Pants- Performed by helper: Pull pants up/down Non-skid slipper socks-  Performed by patient: Don/doff right sock, Don/doff left sock Non-skid slipper socks- Performed by helper: Don/doff right sock, Don/doff left sock       Shoes - Performed by helper: Don/doff right shoe, Don/doff left shoe       TED Hose - Performed by helper: Don/doff left TED hose, Don/doff right TED hose  Lower body assist Assist for lower body dressing: Touching or steadying assistance (Pt > 75%) Assistive Device Comment: Engineer, maintenance (IT)   Toileting steps completed by patient: Adjust clothing prior to toileting Toileting steps completed by helper: Performs perineal hygiene, Adjust clothing after toileting Toileting Assistive Devices: Grab bar or rail  Toileting assist Assist level: Touching or steadying assistance (Pt.75%)   Transfers Chair/bed transfer   Chair/bed transfer method: Ambulatory Chair/bed transfer assist level: Maximal assist (Pt 25 - 49%/lift and lower) Chair/bed transfer assistive device: Armrests, Environmental manager lift: Ecologist Ambulation activity did not occur: Safety/medical concerns   Max distance: 7 ft Assist level: Touching or steadying assistance (Pt > 75%)   Wheelchair   Type: Manual Max wheelchair distance: 150 ft Assist Level: Supervision or verbal cues  Cognition Comprehension Comprehension assist level: Understands complex 90% of the time/cues 10% of the time  Expression Expression assist level: Expresses complex 90% of the time/cues < 10% of the time  Social Interaction Social Interaction assist level: Interacts appropriately with others with medication or extra time (anti-anxiety, antidepressant).  Problem Solving Problem solving assist level: Solves basic problems with no assist  Memory Memory assist level: Recognizes or recalls 75 - 89% of the time/requires cueing 10 - 24% of the time    Medical Problem List and Plan: 1.  Gait abnormality, difficulty with transfers, poor activity tolerance secondary  to left hip fracture.  Cont CIR therapies  Xray hip 12/31 reviewed, stable post-surgical 2.  DVT Prophylaxis/Anticoagulation: Mechanical: Sequential compression devices, below knee Bilateral lower extremities Pharmaceutical: Lovenox  Vascular study neg and limited on 12/23 for DVT 3. Pain Management: Premedicate prior to therapy sessions.   Added voltaren gel to left shoulder   Sportscreme to back for chronic back pain.    Scheduled tylenol qid.   Robaxin PRN   ice/heat in addition to oxycodone/robaxin and above  Lidoderm patch added 1/3 to shoulder/neck, hip 1/4  Baclofen 5 TID started 1/3, increased to 10 on 1/5 4. Mood: Team to provide ego support. LCSW to follow for evaluation and support.  5. Neuropsych: This patient is capable of making decisions on his own behalf. 6. Skin/Wound Care: Monitor wound daily for healing  7. Fluids/Electrolytes/Nutrition: Monitor I/O.  8.  ABLA: Added iron supplement.   Hb 8.3 on 1/1  Labs ordered for Monday  Cont to monitor 9. Abdominal pain/constipation:   KUB reviewed 12/23, unremarkable   Increased miralax to bid.  10. HTN: Monitor bid  PTA Norcasc 5, HCTZ 25  Cont Toprolol 12.5  HCTZ 12.5 started 12/23, held until 12/30, due to hypotension 12/27  Very labile with significant elevations in AM, likely secondary to IVF, d/ced IVF  Improving  Will monitor with increased activity 11. AKI on ?CKD: On HCTZ at home--BUN/Scr  28/1.22 at admission. Encourage fluid intake.    Cr. 1.19 on 1/2  Labs ordered for Monday  Cont encourage fluid intake  IVF bolused on 12/27, ordered qhs IVF started 1/2, d/ced 1/4  Will cont to monitor 12. Thrombocytopenia: Resolved  Likely reactive.  13. Hypoalbuminemia  Supplement initiated 12/23 14. Leukopenia: ?Resolved   WBCs 4.5 on 1/1  Will consider consult if necessary  Will cont to monitor 15. Left neck/shoulder pain with history of breast CA and mastectomy  U/S negative  Voltaren gel ordered  Xray shoulder  reviewed, unremarkable  Premedicate before therapies, discussed with therapies as well.  Scheduled Robaxin, increased dose on 12/29, changed back to PRN due to hallucinations/disorientation on 1/2, resolved.  Cont heat  LOS (Days) 14 A FACE TO FACE EVALUATION WAS PERFORMED  [name] 03/20/2016 8:22 AM

## 2016-03-20 NOTE — Progress Notes (Signed)
Patient has had increase in baclofen to 10 mg tid--1 st dose around 2 pm with drop in BP reported. Will hold baclofen today and monitor BP every 30 minutes for improvement. Is remains low may need fluid bolus. Patient stable but with complaints of fatigue.

## 2016-03-20 NOTE — Progress Notes (Signed)
Physical Therapy Session Note  Patient Details  Name: [name] MRN: [id number] Date of Birth: 1926/04/18  Today's Date: 03/20/2016 PT Individual Time: 0800-0930 PT Individual Time Calculation (min): 90 min    Short Term Goals: Week 3:  PT Short Term Goal 1 (Week 3): = LTGs due to anticipated LOS  Skilled Therapeutic Interventions/Progress Updates:    Pt initially hesitant to perform any mobility but willing to participate with encouragement. TEx: in bed ankle pumps, quad sets, heel slides, SAQ, hip abduction/adduction, seated; LAQ, pillow squeeze, isometric hip abduction. (all exercises 1X10). Pt requiring min assist with HOB elevated and using rail to come to sitting, encouragement and additional time needed. Transfers performed from bed, BSC, and w/c level. Max assistance provided for donning pants following use of BSC. Ambulation included 3 episodes with improved pattern when verbal sequencing provided. Pt reports pain with stance on LLE but encouraging use as tolerated. Also performing standing weight shifts and static balance activities. Encouraging pt to mobilize as much as possible during therapy sessions.   Therapy Documentation Precautions:  Precautions Precautions: Fall Precaution Comments: L neck pain Restrictions Weight Bearing Restrictions: Yes LLE Weight Bearing: Weight bearing as tolerated Pain:  Denies pain at rest, reports increased with mobility (faces scale 6/10 with mobility). Monitoring and modifying session as needed related to pain.     See Function Navigator for Current Functional Status.   Therapy/Group: Individual Therapy  Cassell Clement, PT, CSCS Pager [phone] Office [phone]  03/20/2016, 12:38 PM

## 2016-03-20 NOTE — Progress Notes (Signed)
Pt's BP 88/41,p 50.Pam Love PAC has been notified,orders were received.Keep monitoring pt. closely.

## 2016-03-21 ENCOUNTER — Inpatient Hospital Stay (HOSPITAL_COMMUNITY): Payer: Medicare Other | Admitting: Occupational Therapy

## 2016-03-21 ENCOUNTER — Inpatient Hospital Stay (HOSPITAL_COMMUNITY): Payer: Medicare Other

## 2016-03-21 NOTE — Progress Notes (Signed)
[facility] PHYSICAL MEDICINE & REHABILITATION     PROGRESS NOTE  Subjective/Complaints: No issues overnite, feels "a little rough" this am but denies any pains or other c/os  ROS: Denies CP, SOB, N/V/D.  Objective: Vital Signs: Blood pressure (!) 159/79, pulse 61, temperature 98.1 F (36.7 C), temperature source Oral, resp. rate 16, height 5\' 6"  (1.676 m), weight 98.2 kg (216 lb 9.6 oz), SpO2 98 %. No results found. No results for input(s): WBC, HGB, HCT, PLT in the last 72 hours. No results for input(s): NA, K, CL, GLUCOSE, BUN, CREATININE, CALCIUM in the last 72 hours.  Invalid input(s): CO CBG (last 3)  No results for input(s): GLUCAP in the last 72 hours.  Wt Readings from Last 3 Encounters:  03/18/16 98.2 kg (216 lb 9.6 oz)  03/03/16 90.3 kg (199 lb)  01/15/12 87.4 kg (192 lb 9.6 oz)    Physical Exam:  BP (!) 159/79   Pulse 61   Temp 98.1 F (36.7 C) (Oral)   Resp 16   Ht 5\' 6"  (1.676 m)   Wt 98.2 kg (216 lb 9.6 oz)   SpO2 98%   BMI 34.96 kg/m  Constitutional: She appears well-developed. No distress. Obese.  HENT: Normocephalic and atraumatic.  Eyes: EOM are normal. No discharge.  Cardiovascular: RRR. No JVD. Respiratory: Effort normal and breath sounds normal.  GI: Soft. Bowel sounds are normal.  Midline and RLQ hernias (unchanged) Musculoskeletal: left hip tender to palpation and simple movement of leg. Neurological: She is alert and oriented.  Motor: B/l deltoid, bicep, tricep, grip 5/5  RLE: 4/5 hip flexor, knee extensor, ankle dorsal flexor LLE: 2+/5 hip flexion, 3+/5 KE, (pain inhibition, ankle dorsiflexion/plantar flexion 4+/5  Skin: Skin is warm and dry. She is not diaphoretic.  Hip incision c/d/i, steristrips intact Psychiatric: Normal mood and affect.   Assessment/Plan: 1. Functional deficits secondary to left hip fracture which require 3+ hours per day of interdisciplinary therapy in a comprehensive inpatient rehab setting. Physiatrist is  providing close team supervision and 24 hour management of active medical problems listed below. Physiatrist and rehab team continue to assess barriers to discharge/monitor patient progress toward functional and medical goals.  Function:  Bathing Bathing position   Position: Wheelchair/chair at sink  Bathing parts Body parts bathed by patient: Right arm, Left arm, Chest, Abdomen, Front perineal area, Right upper leg, Left upper leg, Right lower leg, Left lower leg Body parts bathed by helper: Back, Buttocks  Bathing assist Assist Level: Touching or steadying assistance(Pt > 75%), Set up, Assistive device Assistive Device Comment: long-handled sponge Set up : To obtain items, To adjust water temperature  Upper Body Dressing/Undressing Upper body dressing   What is the patient wearing?: Bra, Pull over shirt/dress Bra - Perfomed by patient: Thread/unthread right bra strap, Thread/unthread left bra strap, Hook/unhook bra (pull down sports bra) Bra - Perfomed by helper: Hook/unhook bra (pull down sports bra) Pull over shirt/dress - Perfomed by patient: Thread/unthread right sleeve, Thread/unthread left sleeve, Put head through opening, Pull shirt over trunk Pull over shirt/dress - Perfomed by helper: Thread/unthread right sleeve, Pull shirt over trunk, Put head through opening        Upper body assist Assist Level: Set up, Supervision or verbal cues   Set up : To obtain clothing/put away  Lower Body Dressing/Undressing Lower body dressing   What is the patient wearing?: Pants, Ted Hose, Shoes   Underwear - Performed by helper: Thread/unthread right underwear leg, Thread/unthread left underwear leg,  Pull underwear up/down Pants- Performed by patient: Thread/unthread right pants leg, Thread/unthread left pants leg Pants- Performed by helper: Pull pants up/down Non-skid slipper socks- Performed by patient: Don/doff right sock, Don/doff left sock Non-skid slipper socks- Performed by helper:  Don/doff right sock, Don/doff left sock Socks - Performed by patient: Don/doff right sock, Don/doff left sock   Shoes - Performed by patient: Don/doff right shoe Shoes - Performed by helper: Don/doff left shoe       TED Hose - Performed by helper: Don/doff left TED hose, Don/doff right TED hose  Lower body assist Assist for lower body dressing: Touching or steadying assistance (Pt > 75%) Assistive Device Comment: reacher, sock-aid, long-handled shoe horn    Toileting Toileting   Toileting steps completed by patient: Adjust clothing prior to toileting Toileting steps completed by helper: Performs perineal hygiene, Adjust clothing after toileting Toileting Assistive Devices: Grab bar or rail  Toileting assist Assist level: Touching or steadying assistance (Pt.75%)   Transfers Chair/bed transfer   Chair/bed transfer method: Ambulatory Chair/bed transfer assist level: Moderate assist (Pt 50 - 74%/lift or lower) Chair/bed transfer assistive device: Armrests, Walker (time and cues for forward weight shift, cues for sequence) Mechanical lift: Ecologist Ambulation activity did not occur: Safety/medical concerns   Max distance: 8 Assist level: Touching or steadying assistance (Pt > 75%) (cues for sequence and encouragement)   Wheelchair   Type: Manual Max wheelchair distance: 10 ft  (w/c use in room only) Assist Level: Supervision or verbal cues  Cognition Comprehension Comprehension assist level: Understands complex 90% of the time/cues 10% of the time  Expression Expression assist level: Expresses complex 90% of the time/cues < 10% of the time  Social Interaction Social Interaction assist level: Interacts appropriately with others with medication or extra time (anti-anxiety, antidepressant).  Problem Solving Problem solving assist level: Solves basic problems with no assist  Memory Memory assist level: Recognizes or recalls 75 - 89% of the time/requires cueing 10  - 24% of the time    Medical Problem List and Plan: 1.  Gait abnormality, difficulty with transfers, poor activity tolerance secondary to left hip fracture.  Cont CIR therapies  Xray hip 12/31 reviewed, stable post-surgical 2.  DVT Prophylaxis/Anticoagulation: Mechanical: Sequential compression devices, below knee Bilateral lower extremities Pharmaceutical: Lovenox  Vascular study neg and limited on 12/23 for DVT 3. Pain Management: Premedicate prior to therapy sessions.   Added voltaren gel to left shoulder   Sportscreme to back for chronic back pain.    Scheduled tylenol qid.   Robaxin PRN   ice/heat in addition to oxycodone/robaxin and above  Lidoderm patch added 1/3 to shoulder/neck, hip 1/4  Baclofen 5 TID started 1/3 4. Mood: Team to provide ego support. LCSW to follow for evaluation and support.  5. Neuropsych: This patient is capable of making decisions on his own behalf. 6. Skin/Wound Care: Monitor wound daily for healing  7. Fluids/Electrolytes/Nutrition: Monitor I/O.  8.  ABLA: Added iron supplement.   Hb 8.3 on 1/1  Cont to monitor 9. Abdominal pain/constipation:   KUB reviewed 12/23, unremarkable   Increased miralax to bid.  10. HTN: Monitor bid  PTA Norcasc 5, HCTZ 25  Cont Toprolol 12.5  HCTZ 12.5 started 12/23, on hold until 12/30, due to hypotension 12/27  Very labile with significant elevations in AM, likely secondary to IVF, will d/c IVF  Will monitor with increased activity 11. AKI on ?CKD: On HCTZ at home--BUN/Scr 28/1.22 at admission. Encourage fluid  intake.    Cr. 1.19 on 1/2  Cont encourage fluid intake  IVF bolused on 12/27, ordered qhs IVF started 1/2, d/ced 1/4  Will cont to monitor 12. Thrombocytopenia: Resolved  Likely reactive.  13. Hypoalbuminemia  Supplement initiated 12/23 14. Leukopenia: ?Resolved   WBCs 4.5 on 1/1  Will consider consult if necessary  Will cont to monitor 15. Left neck/shoulder pain with history of breast CA and  mastectomy  U/S negative  Voltaren gel ordered  Xray shoulder reviewed, unremarkable  Premedicate before therapies, discussed with therapies as well.  Scheduled Robaxin, increased dose on 12/29, changed back to PRN due to hallucinations/disorientation on 1/2, resolved.  Cont heat  LOS (Days) 15 A FACE TO FACE EVALUATION WAS PERFORMED  Artemisa Sladek E 03/21/2016 9:12 AM

## 2016-03-21 NOTE — Progress Notes (Addendum)
Occupational Therapy Session Note  Patient Details  Name: JONAYA FRESHOUR MRN: [id number] Date of Birth: December 14, 1926  Today's Date: 03/21/2016 OT Individual Time:  -   5597 -  1530   (75 min)       Short Term Goals: Week 1:  OT Short Term Goal 1 (Week 1): Pt will complete toilet transfer and 1 helper with LRAD OT Short Term Goal 1 - Progress (Week 1): Met OT Short Term Goal 2 (Week 1): Pt will complete bathing with Min A and AE as needed OT Short Term Goal 2 - Progress (Week 1): Progressing toward goal OT Short Term Goal 3 (Week 1): Pt will complete LB dressing with Max A and AE as needed OT Short Term Goal 3 - Progress (Week 1): Met OT Short Term Goal 4 (Week 1): Pt will complete toileting tasks with 1 helper OT Short Term Goal 4 - Progress (Week 1): Progressing toward goal Week 2:  OT Short Term Goal 1 (Week 2): Pt will complete bathing with Min A and AE as needed OT Short Term Goal 1 - Progress (Week 2): Met OT Short Term Goal 2 (Week 2): Pt will complete LB dressing with Mod A and AE as needed OT Short Term Goal 2 - Progress (Week 2): Met OT Short Term Goal 3 (Week 2): Pt will complete toileting tasks with 1 helper OT Short Term Goal 3 - Progress (Week 2): Met OT Short Term Goal 4 (Week 2): Pt will completed sit<>stand with Mod A for self-care tasks at the sink.  OT Short Term Goal 4 - Progress (Week 2): Met  Skilled Therapeutic Interventions/Progress Updates:    Pt sitting in wc sorting through clothes.  Ppt did not have wc locked and had dropped reacher and was bending over to get clothes.    Educated ppt on locking wc and calling for help if this happens again.  Ppt sorted through clothes at wc level.  Rolled over to sink.  Stood for 6-7 minutes at sink while combing hair and cleaning teeth min assist for transitional movements in standing.  Pt put moderate weight through her LLE while standing.  Ambulated 3 feet to feet to bed with mod assist and RW and min cues for turning  with control.  Ppt was mod assist to go  from sitting to supine.  Left in bed with bed alarm engaged and all needs in place.    Pt voiced that she had felt depressed earlier in morning but was feeling better now.   Therapy Documentation Precautions:  Precautions Precautions: Fall Precaution Comments: L neck pain Restrictions Weight Bearing Restrictions: Yes LLE Weight Bearing: Weight bearing as tolerated      Pain:  4/10  Left hip   ADL: ADL ADL Comments: Please see functional navigator for ADL status        See Function Navigator for Current Functional Status.   Therapy/Group: Individual Therapy  Lisa Roca 03/21/2016, 2:58 PM

## 2016-03-21 NOTE — Progress Notes (Signed)
Occupational Therapy Session Note  Patient Details  Name: [name] MRN: [id number] Date of Birth: 1927/01/30  Today's Date: 03/21/2016 OT Individual Time: [id number] Individual Time Calculation (min): 60 min   Short Term Goals: Week 3:  OT Short Term Goal 1 (Week 3): Pt wil complete stand-piovt transfers consistently with Min A OT Short Term Goal 2 (Week 3): Pt will complete LB dressing with Min A consistently with use of ADL AE OT Short Term Goal 3 (Week 3): Pt will tolerate 6 minutes of standing activities in preparation for self-care tasks  Skilled Therapeutic Interventions/Progress Updates:   ADL-retraining at sink with focus on improved standing tolerance, transfers, and improved LB dressing skills.   Pt received supine in bed and awaiting therapist.   With extra time pt completed bed mobility and stand-pivot transfer with mod assist and min instructional cues.   Pt rose from sit to stand at [facility] independently after setup to elevate bed to 25" but required min assist and manual facilitation to weight-shift for pivot to transfer to w/c.   Pt required extra time, setup and steadying assist to bathe lower body, standing 2 times to wash periarea and allow assist to apply lotion to her buttocks but progressed through upper body sequence and grooming without assist.   Pt required overall mod assist to dress lower body and min for upper body d/t time limitations although pt tolerated standing well and without complaint of pain during session.   Pt recovered to w/c and elected to remain seated near bedside at end of session with all needs placed within reach.  Therapy Documentation Precautions:  Precautions Precautions: Fall Precaution Comments: L neck pain Restrictions Weight Bearing Restrictions: Yes LLE Weight Bearing: Weight bearing as toleratedea   Vital Signs: Therapy Vitals Pulse Rate: 61 BP: (!) 159/79   Pain: 8/10 during activity (bed mobility at left  hip).  ADL: ADL ADL Comments: Please see functional navigator for ADL status   See Function Navigator for Current Functional Status.   Therapy/Group: Individual Therapy  [name] 03/21/2016, 10:47 AM

## 2016-03-22 ENCOUNTER — Inpatient Hospital Stay (HOSPITAL_COMMUNITY): Payer: Medicare Other

## 2016-03-22 NOTE — Progress Notes (Signed)
[facility] PHYSICAL MEDICINE & REHABILITATION     PROGRESS NOTE  Subjective/Complaints: Pt without pain c/os this am , eating breakfast  ROS: Denies CP, SOB, N/V/D.  Objective: Vital Signs: Blood pressure (!) 158/64, pulse 62, temperature 98.7 F (37.1 C), temperature source Oral, resp. rate 16, height 5\' 6"  (1.676 m), weight 98.2 kg (216 lb 9.6 oz), SpO2 98 %. No results found. No results for input(s): WBC, HGB, HCT, PLT in the last 72 hours. No results for input(s): NA, K, CL, GLUCOSE, BUN, CREATININE, CALCIUM in the last 72 hours.  Invalid input(s): CO CBG (last 3)  No results for input(s): GLUCAP in the last 72 hours.  Wt Readings from Last 3 Encounters:  03/18/16 98.2 kg (216 lb 9.6 oz)  03/03/16 90.3 kg (199 lb)  01/15/12 87.4 kg (192 lb 9.6 oz)    Physical Exam:  BP (!) 158/64 (BP Location: Right Arm)   Pulse 62   Temp 98.7 F (37.1 C) (Oral)   Resp 16   Ht 5\' 6"  (1.676 m)   Wt 98.2 kg (216 lb 9.6 oz)   SpO2 98%   BMI 34.96 kg/m  Constitutional: She appears well-developed. No distress. Obese.  HENT: Normocephalic and atraumatic.  Eyes: EOM are normal. No discharge.  Cardiovascular: RRR. No JVD. Respiratory: Effort normal and breath sounds normal.  GI: Soft. Bowel sounds are normal.  Midline and RLQ hernias (unchanged) Musculoskeletal: left hip tender to palpation and simple movement of leg. Neurological: She is alert and oriented.  Motor: B/l deltoid, bicep, tricep, grip 5/5  RLE: 4/5 hip flexor, knee extensor, ankle dorsal flexor LLE: 2+/5 hip flexion, 3+/5 KE, (pain inhibition, ankle dorsiflexion/plantar flexion 4+/5  Skin: Skin is warm and dry. She is not diaphoretic.  Hip incision c/d/i, steristrips intact Psychiatric: Normal mood and affect.   Assessment/Plan: 1. Functional deficits secondary to left hip fracture which require 3+ hours per day of interdisciplinary therapy in a comprehensive inpatient rehab setting. Physiatrist is providing close team  supervision and 24 hour management of active medical problems listed below. Physiatrist and rehab team continue to assess barriers to discharge/monitor patient progress toward functional and medical goals.  Function:  Bathing Bathing position   Position: Wheelchair/chair at sink  Bathing parts Body parts bathed by patient: Right arm, Left arm, Chest, Abdomen, Front perineal area, Right upper leg, Left upper leg Body parts bathed by helper: Buttocks, Back, Right lower leg, Left lower leg  Bathing assist Assist Level: Touching or steadying assistance(Pt > 75%) Assistive Device Comment: long-handled sponge Set up : To obtain items, To adjust water temperature  Upper Body Dressing/Undressing Upper body dressing   What is the patient wearing?: Bra, Hospital gown Bra - Perfomed by patient: Thread/unthread right bra strap, Thread/unthread left bra strap Bra - Perfomed by helper: Hook/unhook bra (pull down sports bra) Pull over shirt/dress - Perfomed by patient: Thread/unthread right sleeve, Thread/unthread left sleeve, Put head through opening, Pull shirt over trunk Pull over shirt/dress - Perfomed by helper: Thread/unthread right sleeve, Thread/unthread left sleeve        Upper body assist Assist Level: Set up, Supervision or verbal cues   Set up : To obtain clothing/put away  Lower Body Dressing/Undressing Lower body dressing   What is the patient wearing?: Pants, [employer], Shoes Underwear - Performed by patient: Thread/unthread right underwear leg, Thread/unthread left underwear leg, Pull underwear up/down Underwear - Performed by helper: Thread/unthread right underwear leg, Thread/unthread left underwear leg, Pull underwear up/down Pants- Performed  by patient: Thread/unthread right pants leg, Thread/unthread left pants leg, Pull pants up/down Pants- Performed by helper: Pull pants up/down Non-skid slipper socks- Performed by patient: Don/doff right sock, Don/doff left sock Non-skid  slipper socks- Performed by helper: Don/doff right sock, Don/doff left sock Socks - Performed by patient: Don/doff right sock, Don/doff left sock Socks - Performed by helper: Don/doff right sock, Don/doff left sock Shoes - Performed by patient: Don/doff right shoe, Don/doff left shoe (doffed shoes with no assist) Shoes - Performed by helper: Don/doff right shoe, Don/doff left shoe       TED Hose - Performed by helper: Don/doff left TED hose, Don/doff right TED hose  Lower body assist Assist for lower body dressing: Touching or steadying assistance (Pt > 75%) Assistive Device Comment: reacher, sock-aid, long-handled shoe horn    Toileting Toileting   Toileting steps completed by patient: Adjust clothing prior to toileting Toileting steps completed by helper: Performs perineal hygiene, Adjust clothing after toileting Toileting Assistive Devices: Grab bar or rail  Toileting assist Assist level: Touching or steadying assistance (Pt.75%)   Transfers Chair/bed transfer   Chair/bed transfer method: Stand pivot, Ambulatory Chair/bed transfer assist level: Touching or steadying assistance (Pt > 75%) Chair/bed transfer assistive device: Bedrails, Walker Mechanical lift: Architectural technologist activity did not occur: Safety/medical concerns   Max distance: 8 Assist level: Touching or steadying assistance (Pt > 75%) (cues for sequence and encouragement)   Wheelchair   Type: Manual Max wheelchair distance: 10 ft  (w/c use in room only) Assist Level: Supervision or verbal cues  Cognition Comprehension Comprehension assist level: Understands complex 90% of the time/cues 10% of the time  Expression Expression assist level: Expresses complex 90% of the time/cues < 10% of the time  Social Interaction Social Interaction assist level: Interacts appropriately with others with medication or extra time (anti-anxiety, antidepressant).  Problem Solving Problem solving assist level:  Solves basic problems with no assist  Memory Memory assist level: Recognizes or recalls 75 - 89% of the time/requires cueing 10 - 24% of the time    Medical Problem List and Plan: 1.  Gait abnormality, difficulty with transfers, poor activity tolerance secondary to left hip fracture.  Cont CIR therapies, PT, OT  2.  DVT Prophylaxis/Anticoagulation: Mechanical: Sequential compression devices, below knee Bilateral lower extremities Pharmaceutical: Lovenox  Vascular study neg and limited on 12/23 for DVT 3. Pain Management: Premedicate prior to therapy sessions.   Added voltaren gel to left shoulder   Sportscreme to back for chronic back pain.    Scheduled tylenol qid.   Robaxin PRN   ice/heat in addition to oxycodone/robaxin and above  Lidoderm patch added 1/3 to shoulder/neck, hip 1/4  Baclofen 5 TID started 1/3 4. Mood: Team to provide ego support. LCSW to follow for evaluation and support.  5. Neuropsych: This patient is capable of making decisions on his own behalf. 6. Skin/Wound Care: Monitor wound daily for healing  7. Fluids/Electrolytes/Nutrition: Monitor I/O.  8.  ABLA: Added iron supplement.   Hb 8.3 on 1/1  Cont to monitor 9. Abdominal pain/constipation:   KUB reviewed 12/23, unremarkable   Increased miralax to bid.  10. HTN: Monitor bid  Norvasc 5 on hold, HCTZ 12.5  Cont Toprolol 12.5    Vitals:   03/21/16 1532 03/22/16 0511  BP: (!) 114/59 (!) 158/64  Pulse: 65 62  Resp: 16 16  Temp: 99.1 F (37.3 C) 98.7 F (37.1 C)    Will monitor with increased  activity, Check orthostatics given lability 11. AKI on ?CKD: On HCTZ at home--BUN/Scr 28/1.22 at admission. Encourage fluid intake.    Cr. 1.19 on 1/2  Cont encourage fluid intake  IVF bolused on 12/27, ordered qhs IVF started 1/2, d/ced 1/4, BMP in am  Will cont to monitor 12. Thrombocytopenia: Resolved  Likely reactive.  13. Hypoalbuminemia  Supplement initiated 12/23 14. Leukopenia: ?Resolved   WBCs 4.5  on 1/1, recheck in am  Will consider consult if necessary  Will cont to monitor 15. Left neck/shoulder pain with history of breast CA and mastectomy- pain control is good   U/S negative  Voltaren gel ordered  Xray shoulder reviewed, unremarkable  Premedicate before therapies, discussed with therapies as well.  Scheduled Robaxin, increased dose on 12/29, changed back to PRN due to hallucinations/disorientation on 1/2, resolved.  Cont heat  LOS (Days) 16 A FACE TO FACE EVALUATION WAS PERFORMED  Charlett Blake 03/22/2016 8:58 AM

## 2016-03-22 NOTE — Progress Notes (Signed)
Physical Therapy Note  Patient Details  Name: NASTASHIA CLELLAND MRN: [id number] Date of Birth: 11/21/1926 Today's Date: 03/22/2016  1448-1521, 33 min individual tx Pain:2/10 L proximal thigh/groin; premdicaated  Supine Therapeutic exercise performed with LE to increase strength for functional mobility: 2 x 10 assisted L heel slide, bil hip internal rotation, R/L straight leg raises, bil bridging , modified abdominal crunches. Pt stated she needed to use bed pain.  Bed mobility to get onto pan.  NT notied and pt left on pan in bed, with call bell within reach. .   See function navigator for current status.   Derian Dimalanta 03/22/2016, 12:39 PM

## 2016-03-23 ENCOUNTER — Inpatient Hospital Stay (HOSPITAL_COMMUNITY): Payer: Medicare Other | Admitting: Occupational Therapy

## 2016-03-23 ENCOUNTER — Inpatient Hospital Stay (HOSPITAL_COMMUNITY): Payer: Medicare Other | Admitting: Physical Therapy

## 2016-03-23 LAB — BASIC METABOLIC PANEL
Anion gap: 7 (ref 5–15)
BUN: 28 mg/dL — AB (ref 6–20)
CO2: 29 mmol/L (ref 22–32)
CREATININE: 0.84 mg/dL (ref 0.44–1.00)
Calcium: 10.1 mg/dL (ref 8.9–10.3)
Chloride: 104 mmol/L (ref 101–111)
GFR calc Af Amer: >60 mL/min (ref >60)
GFR calc non Af Amer: >60 mL/min (ref >60)
Glucose, Bld: 103 mg/dL — ABNORMAL HIGH (ref 65–99)
Potassium: 4.5 mmol/L (ref 3.5–5.1)
SODIUM: 140 mmol/L (ref 135–145)

## 2016-03-23 LAB — CBC WITH DIFFERENTIAL/PLATELET
BASOS ABS: 0 10*3/uL (ref 0.0–0.1)
Basophils Relative: 2 %
EOS ABS: 0.2 10*3/uL (ref 0.0–0.7)
EOS PCT: 8 %
HCT: 33.3 % — ABNORMAL LOW (ref 36.0–46.0)
Hemoglobin: 10.5 g/dL — ABNORMAL LOW (ref 12.0–15.0)
Lymphocytes Relative: 22 %
Lymphs Abs: 0.6 10*3/uL — ABNORMAL LOW (ref 0.7–4.0)
MCH: 29.7 pg (ref 26.0–34.0)
MCHC: 31.5 g/dL (ref 30.0–36.0)
MCV: 94.3 fL (ref 78.0–100.0)
Monocytes Absolute: 0.2 10*3/uL (ref 0.1–1.0)
Monocytes Relative: 8 %
Neutro Abs: 1.6 10*3/uL — ABNORMAL LOW (ref 1.7–7.7)
Neutrophils Relative %: 60 %
Platelets: 178 10*3/uL (ref 150–400)
RBC: 3.53 MIL/uL — AB (ref 3.87–5.11)
RDW: 15.5 % (ref 11.5–15.5)
WBC: 2.6 10*3/uL — AB (ref 4.0–10.5)

## 2016-03-23 MED ORDER — AMLODIPINE BESYLATE 5 MG PO TABS
5.0000 mg | ORAL_TABLET | Freq: Every day | ORAL | Status: DC
  Administered 2016-03-23 – 2016-03-24 (×2): 5 mg via ORAL
  Filled 2016-03-23 (×2): qty 1

## 2016-03-23 NOTE — Progress Notes (Signed)
Occupational Therapy Session Note  Patient Details  Name: [name] MRN: [id number] Date of Birth: 07-16-1926  Today's Date: 03/23/2016 OT Individual Time: 1005-1105 OT Individual Time Calculation (min): 60 min     Short Term Goals:Week 1:  OT Short Term Goal 1 (Week 1): Pt will complete toilet transfer and 1 helper with LRAD OT Short Term Goal 1 - Progress (Week 1): Met OT Short Term Goal 2 (Week 1): Pt will complete bathing with Min A and AE as needed OT Short Term Goal 2 - Progress (Week 1): Progressing toward goal OT Short Term Goal 3 (Week 1): Pt will complete LB dressing with Max A and AE as needed OT Short Term Goal 3 - Progress (Week 1): Met OT Short Term Goal 4 (Week 1): Pt will complete toileting tasks with 1 helper OT Short Term Goal 4 - Progress (Week 1): Progressing toward goal Week 2:  OT Short Term Goal 1 (Week 2): Pt will complete bathing with Min A and AE as needed OT Short Term Goal 1 - Progress (Week 2): Met OT Short Term Goal 2 (Week 2): Pt will complete LB dressing with Mod A and AE as needed OT Short Term Goal 2 - Progress (Week 2): Met OT Short Term Goal 3 (Week 2): Pt will complete toileting tasks with 1 helper OT Short Term Goal 3 - Progress (Week 2): Met OT Short Term Goal 4 (Week 2): Pt will completed sit<>stand with Mod A for self-care tasks at the sink.  OT Short Term Goal 4 - Progress (Week 2): Met Week 3:  OT Short Term Goal 1 (Week 3): Pt wil complete stand-piovt transfers consistently with Min A OT Short Term Goal 2 (Week 3): Pt will complete LB dressing with Min A consistently with use of ADL AE OT Short Term Goal 3 (Week 3): Pt will tolerate 6 minutes of standing activities in preparation for self-care tasks  Skilled Therapeutic Interventions/Progress Updates:    Pt received in w/c with gown on stating she did not have any clean tops to put on and wanted to stay in gown. She did want to bathe UB at sink which she did with supervision. Pt  moving very slowly today and talking quite a bit as a strategy to avoid some therapy as she stated she was extremely tired and sore from previous PT session. She was agreeable to attempting tub bench transfers in ADL apt as she is considering trying a shower in the near future.  Pt was able to sit to stand, stand, stand pivot to tub bench with S using her RW.  She was able to scoot her hips back on bench but it was way too painful to lift her L leg over the tub wall. She will have to enter from the L at home.  Pt decided it would be best to wait a month before trying it again. Discussed options of where to purchase a bench. Pt taken back to room with all needs met.    Therapy Documentation Precautions:  Precautions Precautions: Fall Precaution Comments: L neck pain Restrictions Weight Bearing Restrictions: Yes LLE Weight Bearing: Weight bearing as tolerated    Vital Signs: Therapy Vitals Pulse Rate: 77 BP: (!) 161/70 Pain: Pain Assessment Pain Assessment: 0-10 Pain Score: 5  Pain Type: Acute pain Pain Location: Arm Pain Orientation: Left Pain Descriptors / Indicators: Aching Pain Frequency: Intermittent Pain Onset: With Activity Pain Intervention(s): Medication (See eMAR) ADL: ADL ADL Comments: Please see functional  navigator for ADL status   See Function Navigator for Current Functional Status.   Therapy/Group: Individual Therapy  SAGUIER,JULIA 03/23/2016, 10:14 AM

## 2016-03-23 NOTE — Progress Notes (Signed)
Discussed neutropenia with Dr. Earlie Server. He felt it's likely drug induced and due to hospitalization. Colchicine discontinued. He felt that this was not a concern less WBC <500. To monitor every 4-5 day.
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
## 2016-03-23 NOTE — Progress Notes (Signed)
Social Work Patient ID: [name], female   DOB: 1926-04-25, 83 y.o.   MRN: [id number]  Met with pt to discuss discharge disposition and she reports she has spoken with her daughter and both have decided She needs to go to a NH until she is safe to stay home alone while they are working. She has been to [facility] before and her sister has been to [facility] recently. She would like this worker to look at [facility] and see if they would have a bed for her. Will begin process and see if will have bed by the end of the week.

## 2016-03-23 NOTE — Progress Notes (Signed)
[facility] PHYSICAL MEDICINE & REHABILITATION     PROGRESS NOTE  Subjective/Complaints: Pt laying in bed this AM.  She states she had a good weekend and is doing well overall.  "I am a fighter".  She also notes some pruritis at the site of her lidoderm patch on her neck.   ROS: +Pruritis. Denies CP, SOB, N/V/D.  Objective: Vital Signs: Blood pressure (!) 161/70, pulse 77, temperature 98.2 F (36.8 C), temperature source Oral, resp. rate 16, height 5\' 6"  (1.676 m), weight 98.2 kg (216 lb 9.6 oz), SpO2 96 %. No results found.  Recent Labs  03/23/16 0746  WBC 2.6*  HGB 10.5*  HCT 33.3*  PLT 178    Recent Labs  03/23/16 0746  NA 140  K 4.5  CL 104  GLUCOSE 103*  BUN 28*  CREATININE 0.84  CALCIUM 10.1   CBG (last 3)  No results for input(s): GLUCAP in the last 72 hours.  Wt Readings from Last 3 Encounters:  03/18/16 98.2 kg (216 lb 9.6 oz)  03/03/16 90.3 kg (199 lb)  01/15/12 87.4 kg (192 lb 9.6 oz)    Physical Exam:  BP (!) 161/70   Pulse 77   Temp 98.2 F (36.8 C) (Oral)   Resp 16   Ht 5\' 6"  (1.676 m)   Wt 98.2 kg (216 lb 9.6 oz)   SpO2 96%   BMI 34.96 kg/m  Constitutional: She appears well-developed. No distress. Obese.  HENT: Normocephalic and atraumatic.  Eyes: EOM are normal. No discharge.  Cardiovascular: RRR. No JVD. Respiratory: Effort normal and breath sounds normal.  GI: Soft. Bowel sounds are normal.  Midline and RLQ hernias  Musculoskeletal: left hip tender to palpation and simple movement of leg. Neurological: She is alert and oriented.  Motor: B/l deltoid, bicep, tricep, grip 5/5  RLE: 4/5 hip flexor, knee extensor, ankle dorsal flexor LLE: 3-/5 hip flexion, 3+/5 KE, (pain inhibition), ankle dorsiflexion/plantar flexion 4+/5  Skin: Skin is warm and dry. She is not diaphoretic.  Hip incision c/d/i Erythema left neck Psychiatric: Normal mood and affect.   Assessment/Plan: 1. Functional deficits secondary to left hip fracture which  require 3+ hours per day of interdisciplinary therapy in a comprehensive inpatient rehab setting. Physiatrist is providing close team supervision and 24 hour management of active medical problems listed below. Physiatrist and rehab team continue to assess barriers to discharge/monitor patient progress toward functional and medical goals.  Function:  Bathing Bathing position   Position: Wheelchair/chair at sink  Bathing parts Body parts bathed by patient: Right arm, Left arm, Chest, Abdomen, Front perineal area, Right upper leg, Left upper leg Body parts bathed by helper: Buttocks, Back, Right lower leg, Left lower leg  Bathing assist Assist Level: Touching or steadying assistance(Pt > 75%) Assistive Device Comment: long-handled sponge Set up : To obtain items, To adjust water temperature  Upper Body Dressing/Undressing Upper body dressing   What is the patient wearing?: Bra, Hospital gown Bra - Perfomed by patient: Thread/unthread right bra strap, Thread/unthread left bra strap Bra - Perfomed by helper: Hook/unhook bra (pull down sports bra) Pull over shirt/dress - Perfomed by patient: Thread/unthread right sleeve, Thread/unthread left sleeve, Put head through opening, Pull shirt over trunk Pull over shirt/dress - Perfomed by helper: Thread/unthread right sleeve, Thread/unthread left sleeve        Upper body assist Assist Level: Set up, Supervision or verbal cues   Set up : To obtain clothing/put away  Lower Body Dressing/Undressing Lower body  dressing   What is the patient wearing?: Pants, [employer], Shoes Underwear - Performed by patient: Thread/unthread right underwear leg, Thread/unthread left underwear leg, Pull underwear up/down Underwear - Performed by helper: Thread/unthread right underwear leg, Thread/unthread left underwear leg, Pull underwear up/down Pants- Performed by patient: Thread/unthread right pants leg, Thread/unthread left pants leg, Pull pants up/down Pants-  Performed by helper: Pull pants up/down Non-skid slipper socks- Performed by patient: Don/doff right sock, Don/doff left sock Non-skid slipper socks- Performed by helper: Don/doff right sock, Don/doff left sock Socks - Performed by patient: Don/doff right sock, Don/doff left sock Socks - Performed by helper: Don/doff right sock, Don/doff left sock Shoes - Performed by patient: Don/doff right shoe, Don/doff left shoe (doffed shoes with no assist) Shoes - Performed by helper: Don/doff right shoe, Don/doff left shoe       TED Hose - Performed by helper: Don/doff left TED hose, Don/doff right TED hose  Lower body assist Assist for lower body dressing: Touching or steadying assistance (Pt > 75%) Assistive Device Comment: reacher, sock-aid, long-handled shoe horn    Toileting Toileting   Toileting steps completed by patient: Adjust clothing prior to toileting Toileting steps completed by helper: Performs perineal hygiene, Adjust clothing after toileting Toileting Assistive Devices: Grab bar or rail  Toileting assist Assist level: Touching or steadying assistance (Pt.75%)   Transfers Chair/bed transfer   Chair/bed transfer method: Stand pivot Chair/bed transfer assist level: Supervision or verbal cues Chair/bed transfer assistive device: Environmental consultant, Marine scientist lift: Ecologist Ambulation activity did not occur: Safety/medical concerns   Max distance: 8 Assist level: Touching or steadying assistance (Pt > 75%) (cues for sequence and encouragement)   Wheelchair   Type: Manual Max wheelchair distance: 10 ft  (w/c use in room only) Assist Level: Supervision or verbal cues  Cognition Comprehension Comprehension assist level: Understands complex 90% of the time/cues 10% of the time  Expression Expression assist level: Expresses complex 90% of the time/cues < 10% of the time  Social Interaction Social Interaction assist level: Interacts appropriately with others  with medication or extra time (anti-anxiety, antidepressant).  Problem Solving Problem solving assist level: Solves basic problems with no assist  Memory Memory assist level: Recognizes or recalls 75 - 89% of the time/requires cueing 10 - 24% of the time    Medical Problem List and Plan: 1.  Gait abnormality, difficulty with transfers, poor activity tolerance secondary to left hip fracture.  Cont CIR therapies             Xray hip 12/31 reviewed, stable post-surgical 2.  DVT Prophylaxis/Anticoagulation: Mechanical: Sequential compression devices, below knee Bilateral lower extremities Pharmaceutical: Lovenox  Vascular study neg and limited on 12/23 for DVT 3. Pain Management: Premedicate prior to therapy sessions.   Added voltaren gel to left shoulder   Sportscreme to back for chronic back pain.    Scheduled tylenol qid.   Robaxin PRN   ice/heat in addition to oxycodone/robaxin and above  Lidoderm patch added 1/3 to shoulder/neck, hip 1/4  Baclofen 5 TID started 1/3 4. Mood: Team to provide ego support. LCSW to follow for evaluation and support.  5. Neuropsych: This patient is capable of making decisions on his own behalf. 6. Skin/Wound Care: Monitor wound daily for healing  7. Fluids/Electrolytes/Nutrition: Monitor I/O.  8.  ABLA: Added iron supplement.   Hb 8.3 on 1/1  Cont to monitor 9. Abdominal pain/constipation:   KUB reviewed 12/23, unremarkable   Increased miralax to bid.  10. HTN: Monitor bid   Norvasc 5 started 1/3  Cont HCTZ 12.5  Cont Toprolol 12.5  Will monitor with increased activity  Orthostatics WNL 11. AKI: On HCTZ at home--BUN/Scr 28/1.22 at admission. Encourage fluid intake.    Cr. 0. 84 on 1/8  Cont encourage fluid intake  IVF bolused on 12/27, ordered qhs IVF started 1/2, d/ced 1/4  Will cont to monitor 12. Thrombocytopenia: Resolved  Likely reactive.  13. Hypoalbuminemia  Supplement initiated 12/23 14. Leukopenia:   WBCs 2.6 on 1/8, with low  Neutrophil count  Will consult and speak with Heme/Onc  Will cont to monitor 15. Left neck/shoulder pain with history of breast CA and mastectomy  U/S negative  Voltaren gel ordered  Xray shoulder reviewed, unremarkable  Premedicate before therapies, discussed with therapies as well.  Scheduled Robaxin, increased dose on 12/29, changed back to PRN due to hallucinations/disorientation on 1/2, resolved.  Cont heat  LOS (Days) 17 A FACE TO FACE EVALUATION WAS PERFORMED  Bobie Caris Lorie Phenix 03/23/2016 8:58 AM

## 2016-03-23 NOTE — Progress Notes (Signed)
Physical Therapy Session Note  Patient Details  Name: [name] MRN: [id number] Date of Birth: Sep 14, 1926  Today's Date: 03/23/2016 PT Individual Time: [phone] and 1400-1500 PT Individual Time Calculation (min): 75 min and 60 min (total 135 min)    Short Term Goals: Week 3:  PT Short Term Goal 1 (Week 3): = LTGs due to anticipated LOS  Skilled Therapeutic Interventions/Progress Updates:   Tx 1: pt received seated in bed eating breakfast; denies pain at rest and agreeable to treatment. Pt requesting to don clothes, but only has pants clean at this time; reports family bringing more clothes later today. Supine>sit on EOB with S, HOB elevated and bedrails. [facility] hospital gown with setupA. Sit >stand and stand pivot transfer with RW and close S; antalgic gait pattern and increased time. Min cues for reaching back for chair before sitting. Seated in chair pt performs grooming with setupA. TEDs and shoes donned totalA. Sit <>stand at sink while performing grooming with S and occasional use of RW for balance, otherwise no UE support while performing bilateral UE grooming tasks. After 10 min standing pt begins resting elbows on sink; requires prompting to identify fatigue and need for seated rest break. Gait x35' with RW and min guard; antalgic slow gait but no LOB or buckling noted. Maintained pt in conversation about her family throughout gait trial to take attention away from pain/exertion. Gait in/out of bathroom with RW and min guard x15' each. Performed doffing brief and hygiene; required assist for donning clean brief. Standing at sink pt washed hands with close S and RW for balance. Remained seated in w/c at end of session, all needs in reach.   Tx 2: Pt received seated in w/c, c/o pain as below and agreeable to treatment. W/c propulsion 5' with BUE and S before fatigued; min cues for navigation around obstacles. Car transfer performed stand pivot with RW and min guard, minA for assisting  LLE in/out of car d/t pain and limited ROM. Pt declines ambulation at this time d/t pain; agreeable to try nustep. Performed level 2 with average 30 steps/min with BUE/BLE for strengthening, L hip ROM, and aerobic endurance. Gait in/out of bathroom with RW and close S. Required assist for donning pants. Sit >supine with minA for LLE management. Remained supine in bed with CSW present; all needs in reach.   Therapy Documentation Precautions:  Precautions Precautions: Fall Precaution Comments: L neck pain Restrictions Weight Bearing Restrictions: Yes LLE Weight Bearing: Weight bearing as tolerated Pain: Pain Assessment Pain Assessment: 0-10 Pain Score: 5  Pain Type: Acute pain Pain Location: Arm Pain Orientation: Left Pain Descriptors / Indicators: Aching Pain Frequency: Intermittent Pain Onset: With Activity Pain Intervention(s): Medication (See eMAR)   See Function Navigator for Current Functional Status.   Therapy/Group: Individual Therapy  Luberta Mutter 03/23/2016, 9:18 AM

## 2016-03-23 NOTE — Progress Notes (Signed)
Responded to consult for pt who was struggling and wished someone to talk to in hr given that she would be in rm b/t tests. Visited w/ her a while and heard of all her family and church family who have come to visit her and called and sent cards and flowers (as she has been here a while). Pt mentioned having her hip and eyes worked on -- and that for a while she was confused as they were trying to get her meds in good proportion.   Her pastor visited at that time, and maybe he can come back now after Christmas as she would enjoy his visit more now. She feels good that so many at her church continue to pray for her, and, whenever anyone asks what she needs, she says prayer. She is a strong woman of faith who testifiess she would not be here now, with everything she's been through, but for God.   Provided ministry of presence, spiritual/emotional support and prayer -- which pt much appreciated. She said she had large-print Bibles at home, but none w/ her, so went and retrieved a copy of the New Testament/ Psalms and gave it to her ( the largest-print version we have). She was grateful. [facility] chaplains are available for f/u if she asks her nurse to page for one.   03/23/16 1400  Clinical Encounter Type  Visited With Patient  Visit Type Initial;Psychological support;Spiritual support;Social support  Referral From Nurse  Spiritual Encounters  Spiritual Needs Literature;Prayer;Emotional  Stress Factors  Patient Stress Factors Health changes;Loss of control   Gerrit Heck, Chaplain

## 2016-03-24 ENCOUNTER — Inpatient Hospital Stay (HOSPITAL_COMMUNITY): Payer: Medicare Other | Admitting: Physical Therapy

## 2016-03-24 ENCOUNTER — Inpatient Hospital Stay (HOSPITAL_COMMUNITY): Payer: Medicare Other | Admitting: Occupational Therapy

## 2016-03-24 ENCOUNTER — Inpatient Hospital Stay (HOSPITAL_COMMUNITY): Payer: Medicare Other

## 2016-03-24 LAB — BASIC METABOLIC PANEL
ANION GAP: 8 (ref 5–15)
BUN: 28 mg/dL — AB (ref 6–20)
CO2: 28 mmol/L (ref 22–32)
Calcium: 10.4 mg/dL — ABNORMAL HIGH (ref 8.9–10.3)
Chloride: 102 mmol/L (ref 101–111)
Creatinine, Ser: 0.94 mg/dL (ref 0.44–1.00)
GFR calc Af Amer: >60 mL/min (ref >60)
GFR calc non Af Amer: 53 mL/min — ABNORMAL LOW (ref >60)
GLUCOSE: 113 mg/dL — AB (ref 65–99)
POTASSIUM: 4 mmol/L (ref 3.5–5.1)
Sodium: 138 mmol/L (ref 135–145)

## 2016-03-24 MED ORDER — AMLODIPINE BESYLATE 5 MG PO TABS: 5.0000 mg | ORAL_TABLET | Freq: Every day | ORAL | Status: DC

## 2016-03-24 MED ORDER — DICLOFENAC SODIUM 1 % TD GEL: 2.0000 g | Freq: Three times a day (TID) | TRANSDERMAL | Status: DC

## 2016-03-24 MED ORDER — PRO-STAT SUGAR FREE PO LIQD: 30.0000 mL | Freq: Two times a day (BID) | ORAL | 0 refills | Status: DC

## 2016-03-24 MED ORDER — OXYCODONE HCL 5 MG PO TABS: 5.0000 mg | ORAL_TABLET | ORAL | Status: DC | PRN

## 2016-03-24 MED ORDER — BACLOFEN 10 MG PO TABS: 5.0000 mg | ORAL_TABLET | Freq: Three times a day (TID) | ORAL | Status: DC

## 2016-03-24 MED ORDER — HYDROCHLOROTHIAZIDE 12.5 MG PO CAPS: 12.5000 mg | ORAL_CAPSULE | Freq: Every day | ORAL | Status: DC

## 2016-03-24 MED ORDER — CAMPHOR-MENTHOL 0.5-0.5 % EX LOTN: TOPICAL_LOTION | CUTANEOUS | 0 refills | Status: DC | PRN

## 2016-03-24 MED ORDER — ACETAMINOPHEN 325 MG PO TABS: 650.0000 mg | ORAL_TABLET | Freq: Three times a day (TID) | ORAL | Status: DC

## 2016-03-24 MED ORDER — MUSCLE RUB 10-15 % EX CREA: 1.0000 "application " | TOPICAL_CREAM | Freq: Three times a day (TID) | CUTANEOUS | 0 refills | Status: AC

## 2016-03-24 MED ORDER — TRAMADOL HCL 50 MG PO TABS: 50.0000 mg | ORAL_TABLET | Freq: Four times a day (QID) | ORAL | Status: DC | PRN

## 2016-03-24 MED ORDER — LIDOCAINE 5 % EX PTCH: 1.0000 | MEDICATED_PATCH | CUTANEOUS | 0 refills | Status: DC

## 2016-03-24 MED ORDER — POLYETHYLENE GLYCOL 3350 17 G PO PACK: 17.0000 g | PACK | Freq: Every day | ORAL | 0 refills | Status: DC

## 2016-03-24 MED ORDER — ENSURE ENLIVE PO LIQD: 237.0000 mL | Freq: Every day | ORAL | 12 refills | Status: AC

## 2016-03-24 MED ORDER — HYDROCERIN EX CREA: 1.0000 "application " | TOPICAL_CREAM | Freq: Two times a day (BID) | CUTANEOUS | 0 refills | Status: DC

## 2016-03-24 NOTE — Progress Notes (Signed)
PHYSICAL MEDICINE & REHABILITATION     PROGRESS NOTE  Subjective/Complaints: Pt laying in bed this AM.  She slept well overnight.  She states her shoulder is more sore from putting pressure on the walker to ambulate.   ROS: Denies CP, SOB, N/V/D.  Objective: Vital Signs: Blood pressure (!) 149/66, pulse 63, temperature 98.3 F (36.8 C), temperature source Oral, resp. rate 18, height 5\' 6"  (1.676 m), weight 98.2 kg (216 lb 9.6 oz), SpO2 97 %. No results found.  Recent Labs  03/23/16 0746  WBC 2.6*  HGB 10.5*  HCT 33.3*  PLT 178    Recent Labs  03/23/16 0746 03/24/16 0801  NA 140 138  K 4.5 4.0  CL 104 102  GLUCOSE 103* 113*  BUN 28* 28*  CREATININE 0.84 0.94  CALCIUM 10.1 10.4*   CBG (last 3)  No results for input(s): GLUCAP in the last 72 hours.  Wt Readings from Last 3 Encounters:  03/18/16 98.2 kg (216 lb 9.6 oz)  03/03/16 90.3 kg (199 lb)  01/15/12 87.4 kg (192 lb 9.6 oz)    Physical Exam:  BP (!) 149/66 (BP Location: Right Arm)   Pulse 63   Temp 98.3 F (36.8 C) (Oral)   Resp 18   Ht 5\' 6"  (1.676 m)   Wt 98.2 kg (216 lb 9.6 oz)   SpO2 97%   BMI 34.96 kg/m  Constitutional: She appears well-developed. No distress. Obese.  HENT: Normocephalic and atraumatic.  Eyes: EOM are normal. No discharge.  Cardiovascular: RRR. No JVD. Respiratory: Effort normal and breath sounds normal.  GI: Soft. Bowel sounds are normal.  Midline and RLQ hernias - not examined today Musculoskeletal: left hip tender to palpation and simple movement of leg. Neurological: She is alert and oriented.  Motor: B/l deltoid, bicep, tricep, grip 5/5  RLE: 4/5 hip flexor, knee extensor, ankle dorsal flexor LLE: 3-/5 hip flexion, 3+/5 KE, (pain inhibition, stable), ankle dorsiflexion/plantar flexion 4+/5  Skin: Skin is warm and dry. She is not diaphoretic.  Hip incision c/d/i Psychiatric: Normal mood and affect.   Assessment/Plan: 1. Functional deficits secondary to left  hip fracture which require 3+ hours per day of interdisciplinary therapy in a comprehensive inpatient rehab setting. Physiatrist is providing close team supervision and 24 hour management of active medical problems listed below. Physiatrist and rehab team continue to assess barriers to discharge/monitor patient progress toward functional and medical goals.  Function:  Bathing Bathing position   Position: Wheelchair/chair at sink  Bathing parts Body parts bathed by patient: Right arm, Left arm, Chest, Abdomen, Front perineal area, Right upper leg, Left upper leg Body parts bathed by helper: Buttocks, Back, Right lower leg, Left lower leg  Bathing assist Assist Level: Touching or steadying assistance(Pt > 75%) Assistive Device Comment: long-handled sponge Set up : To obtain items, To adjust water temperature  Upper Body Dressing/Undressing Upper body dressing   What is the patient wearing?: Bra, Hospital gown Bra - Perfomed by patient: Thread/unthread right bra strap, Thread/unthread left bra strap Bra - Perfomed by helper: Hook/unhook bra (pull down sports bra) Pull over shirt/dress - Perfomed by patient: Thread/unthread right sleeve, Thread/unthread left sleeve, Put head through opening, Pull shirt over trunk Pull over shirt/dress - Perfomed by helper: Thread/unthread right sleeve, Thread/unthread left sleeve        Upper body assist Assist Level: Set up, Supervision or verbal cues   Set up : To obtain clothing/put away  Lower Body Dressing/Undressing Lower body  dressing   What is the patient wearing?: Pants, [employer], Shoes Underwear - Performed by patient: Thread/unthread right underwear leg, Thread/unthread left underwear leg, Pull underwear up/down Underwear - Performed by helper: Thread/unthread right underwear leg, Thread/unthread left underwear leg, Pull underwear up/down Pants- Performed by patient: Thread/unthread right pants leg, Thread/unthread left pants leg, Pull pants  up/down Pants- Performed by helper: Pull pants up/down Non-skid slipper socks- Performed by patient: Don/doff right sock, Don/doff left sock Non-skid slipper socks- Performed by helper: Don/doff right sock, Don/doff left sock Socks - Performed by patient: Don/doff right sock, Don/doff left sock Socks - Performed by helper: Don/doff right sock, Don/doff left sock Shoes - Performed by patient: Don/doff right shoe, Don/doff left shoe (doffed shoes with no assist) Shoes - Performed by helper: Don/doff right shoe, Don/doff left shoe       TED Hose - Performed by helper: Don/doff left TED hose, Don/doff right TED hose  Lower body assist Assist for lower body dressing: Touching or steadying assistance (Pt > 75%) Assistive Device Comment: reacher, sock-aid, long-handled shoe horn    Toileting Toileting   Toileting steps completed by patient: Adjust clothing prior to toileting, Performs perineal hygiene Toileting steps completed by helper: Performs perineal hygiene, Adjust clothing after toileting Toileting Assistive Devices: Grab bar or rail  Toileting assist Assist level: Touching or steadying assistance (Pt.75%)   Transfers Chair/bed transfer   Chair/bed transfer method: Ambulatory Chair/bed transfer assist level: Supervision or verbal cues Chair/bed transfer assistive device: Environmental consultant, Marine scientist lift: Architectural technologist activity did not occur: Safety/medical concerns   Max distance: 35 Assist level: Touching or steadying assistance (Pt > 75%)   Wheelchair   Type: Manual Max wheelchair distance: 75 Assist Level: Supervision or verbal cues  Cognition Comprehension Comprehension assist level: Understands complex 90% of the time/cues 10% of the time  Expression Expression assist level: Expresses complex 90% of the time/cues < 10% of the time  Social Interaction Social Interaction assist level: Interacts appropriately with others with medication or extra  time (anti-anxiety, antidepressant).  Problem Solving Problem solving assist level: Solves basic problems with no assist  Memory Memory assist level: Recognizes or recalls 75 - 89% of the time/requires cueing 10 - 24% of the time    Medical Problem List and Plan: 1.  Gait abnormality, difficulty with transfers, poor activity tolerance secondary to left hip fracture.  Cont CIR therapies             Xray hip 12/31 reviewed, stable post-surgical 2.  DVT Prophylaxis/Anticoagulation: Mechanical: Sequential compression devices, below knee Bilateral lower extremities Pharmaceutical: Lovenox  Vascular study neg and limited on 12/23 for DVT 3. Pain Management: Premedicate prior to therapy sessions.   Added voltaren gel to left shoulder   Sportscreme to back for chronic back pain.    Scheduled tylenol qid.   Robaxin PRN   ice/heat in addition to oxycodone/robaxin and above  Lidoderm patch added 1/3 to shoulder/neck, hip 1/4  Baclofen 5 TID started 1/3 4. Mood: Team to provide ego support. LCSW to follow for evaluation and support.  5. Neuropsych: This patient is capable of making decisions on his own behalf. 6. Skin/Wound Care: Monitor wound daily for healing  7. Fluids/Electrolytes/Nutrition: Monitor I/O.  8.  ABLA: Added iron supplement.   Hb 10.5 on 1/9  Cont to monitor 9. Abdominal pain/constipation:   KUB reviewed 12/23, unremarkable   Increased miralax to bid.  10. HTN: Monitor bid   Norvasc 5 started  1/3  Cont HCTZ 12.5  Cont Toprolol 12.5  Will monitor with increased activity  Slightly labile, but appears to be improving, will cont to monitor  Orthostatics WNL 11. AKI: On HCTZ at home: Improved  BUN/Scr 28/1.22 at admission. Encourage fluid intake.    Cr. 0. 94 on 1/9  Cont encourage fluid intake  IVF bolused on 12/27, ordered qhs IVF started 1/2, d/ced 1/4  Will cont to monitor 12. Thrombocytopenia: Resolved  Likely reactive.  13. Hypoalbuminemia  Supplement initiated  12/23 14. Leukopenia:   WBCs 2.6 on 1/8, with low Neutrophil count  Per Heme/Onc, likely secondary to hospitalization and medications, Colchicine d/ced, monitor.  Appreciate recs  Will cont to monitor 15. Left neck/shoulder pain with history of breast CA and mastectomy  U/S negative  Voltaren gel ordered  Xray shoulder reviewed, unremarkable  Premedicate before therapies, discussed with therapies as well.  Scheduled Robaxin, increased dose on 12/29, changed back to PRN due to hallucinations/disorientation on 1/2, resolved.  Cont heat  LOS (Days) 18 A FACE TO FACE EVALUATION WAS PERFORMED  Zenia Guest Lorie Phenix 03/24/2016 8:43 AM

## 2016-03-24 NOTE — Progress Notes (Signed)
Social Work Patient ID: [name], female   DOB: 1927-01-31, 83 y.o.   MRN: [id number]  Sent information to [facility] facility where pt would like to go after rehab stay. They can offer her a bed for Friday. Have informed pt and contacted daughter via telephone to inform of this, both are agreeable to this plan. Daughter to take pt's plants and flowers home before Friday. Pt will go non-emergency transport, work toward Transfer Friday.

## 2016-03-24 NOTE — Progress Notes (Signed)
Pt went down for xray

## 2016-03-24 NOTE — Progress Notes (Signed)
Physical Therapy Session Note  Patient Details  Name: SHALI HAU MRN: [id number] Date of Birth: 02/20/1927  Today's Date: 03/24/2016 PT Individual Time: 0900-1000 PT Individual Time Calculation (min): 60 min    Short Term Goals: Week 3:  PT Short Term Goal 1 (Week 3): = LTGs due to anticipated LOS  Skilled Therapeutic Interventions/Progress Updates:    Patient in bed upon arrival, premedicated. Donned TED hose and shoes with total A and patient transferred supine > sit to R using rail with HOB raised with min A for LLE and increased time. Performed sit > stand from EOB using rail with RW and mod A and ambulated to bathroom using RW with min A x 25 ft. Performed toilet transfer and toileting using RW with min A overall and assist for thoroughness for hygiene and clothing management. Patient stood leaning at sink for hand hygiene with steady assist. Patient propelled wheelchair to and from gym using BUE with supervision and increased time. Ambulatory transfer to and from mat table using RW with close supervision. Blocked practice sit <> stand transfer training from mat table with LUE on RW and pushing up/reaching back with RUE with supervision x 10 with cues for technique and breathing. Patient left sitting in wheelchair with all needs within reach.   Therapy Documentation Precautions:  Precautions Precautions: Fall Precaution Comments: L neck pain Restrictions Weight Bearing Restrictions: Yes LLE Weight Bearing: Weight bearing as tolerated Pain: Pain Assessment Pain Assessment: 0-10 Pain Score: 8  Pain Type: Acute pain Pain Location: Leg Pain Orientation: Left Pain Descriptors / Indicators: Aching Pain Frequency: Intermittent Pain Onset: On-going Patients Stated Pain Goal: 2 Pain Intervention(s): Medication (See eMAR)   See Function Navigator for Current Functional Status.   Therapy/Group: Individual Therapy  Avelardo Reesman, Murray Hodgkins 03/24/2016, 12:22 PM

## 2016-03-24 NOTE — Progress Notes (Signed)
Occupational Therapy Session Note  Patient Details  Name: [name] MRN: [id number] Date of Birth: 11-01-1926  Today's Date: 03/24/2016  Session 1 OT Individual Time: 1100-1155 OT Individual Time Calculation (min): 55 min  Session 2 OT Individual Time: 1411-1536 OT Individual Time Calculation (min): 85 min    Short Term Goals: Week 3:  OT Short Term Goal 1 (Week 3): Pt wil complete stand-piovt transfers consistently with Min A OT Short Term Goal 2 (Week 3): Pt will complete LB dressing with Min A consistently with use of ADL AE OT Short Term Goal 3 (Week 3): Pt will tolerate 6 minutes of standing activities in preparation for self-care tasks  Skilled Therapeutic Interventions/Progress Updates:  Session 1   1:1 OT session focused on increased independence with bathing/dressing, sit<>stands, and standing tolerance. Pt greeted in wc reporting fatigue, but improved pain management overall. Bathing/dressing completed w/c level at the sink.  Pt able to utilize ADL AE to thread pant legs, min guard A sit<>stand, and close supervision w/ intermittent min gaurd for dynamic balance when reaching to pull up pants. Discussed OT goals and dc plan, then pt's son and daughter-in-law entered room and pt left with needs met and family present.   Session 2 Second OT session focused on B LE strength/coordination, therapeutic exercise, standing endurance, dynamic standing balance, and sit<>stands. Pt completed B LE there-ex including knee extension and seated marches 3 sets of 10. B LE coordination, timing, and knee extension with seated soccer ball kicks. Dynamic standing balance, weight shifting, and UB coordination using horse shoe tossing activity x3 rounds. Pt required overall close supervision with intermittent min guard A when tossing horse shoe w/ unilateral UE support. Pt tolerated ~ 7 minutes at one standing bout + OT facilitating improved balance strategies. Pt returned to room, stand-pivot  transfer <> toilet 2/ min guard A. Pt voided successfully and returned to bed at end of session. Pt left with needs met.   Therapy Documentation Precautions: Precautions Precautions: Fall Precaution Comments: L neck pain Restrictions Weight Bearing Restrictions: Yes LLE Weight Bearing: Weight bearing as tolerated Pain: Pain Assessment Pain Assessment: 0-10 Pain Score: 5  Pain Type: Acute pain Pain Location: Leg Pain Orientation: Left Pain Descriptors / Indicators: Aching Pain Frequency: Intermittent Pain Onset: with activity  Patients Stated Pain Goal: 2 Pain Intervention(s): Repositioned ADL: ADL ADL Comments: Please see functional navigator for ADL status  See Function Navigator for Current Functional Status.   Therapy/Group: Individual Therapy  [name] 03/24/2016, 4:33 PM

## 2016-03-24 NOTE — Discharge Summary (Signed)
Physician Discharge Summary  Patient ID: KARNEISHA KACZANOWSKI MRN: [id number] DOB/AGE: [birth year] 83 y.o.  Admit date: 03/06/2016 Discharge date: 03/27/2016  Discharge Diagnoses:  Principal Problem:   Closed left hip fracture, initial encounter [facility]) Active Problems:   Hypoalbuminemia due to protein-calorie malnutrition (HCC)   Leukopenia   Edema   Pain   Neck pain on left side   History of breast cancer   AKI (acute kidney injury) ([place])   Chronic left shoulder pain   Disorientation   Discharged Condition:  Stable   Significant Diagnostic Studies: Dg Shoulder Left  Result Date: 03/11/2016 CLINICAL DATA:  Left shoulder pain without reported injury. EXAM: LEFT SHOULDER - 2+ VIEW COMPARISON:  None. FINDINGS: There is no evidence of fracture or dislocation. There is no evidence of arthropathy or other focal bone abnormality. Soft tissues are unremarkable. IMPRESSION: Normal left shoulder. Electronically Signed   By: Marijo Conception, M.D.   On: 03/11/2016 15:41    Dg Abd Portable 1v  Result Date: 03/07/2016 CLINICAL DATA:  Abdominal pain EXAM: PORTABLE ABDOMEN - 1 VIEW COMPARISON:  CT abdomen/ pelvis dated 03/20/2015 FINDINGS: Nonobstructive bowel gas pattern. Normal colonic stool burden. Right hip arthroplasty. Status post ORIF of the left hip, incompletely visualized. Degenerative changes of the lumbar spine. IMPRESSION: Unremarkable abdominal radiograph. Electronically Signed   By: Julian Hy M.D.   On: 03/07/2016 11:24    US Soft Tissue Neck  Result Date: 03/09/2016 CLINICAL DATA:  Left neck mass EXAM: ULTRASOUND OF HEAD/NECK SOFT TISSUES TECHNIQUE: Ultrasound examination of the head and neck soft tissues was performed in the area of clinical concern. COMPARISON:  None. FINDINGS: There is no evidence of soft tissue mass or adenopathy in the left neck. IMPRESSION: No evidence of mass or adenopathy in the left neck. Electronically Signed   By: Marybelle Killings M.D.   On:  03/09/2016 15:11    Dg Hip Unilat With Pelvis 2-3 Views Left  Result Date: 03/15/2016 CLINICAL DATA:  Pt states some pain since left IM nail surgery on 03/03/2016, severe pain x yesterday evening. Pt also states pain starts at the knee and runs up leg toward hip. EXAM: DG HIP (WITH OR WITHOUT PELVIS) 2-3V LEFT COMPARISON:  03/03/2016 FINDINGS: No acute fracture. The fracture of the proximal left femur, reduced with a compression screw an intramedullary rod, appears well aligned and is unchanged from the operative images. The orthopedic hardware is well-seated. The right hip prosthesis, incompletely imaged, appears well-seated and aligned. SI joints and symphysis pubis are normally aligned. Bones are diffusely demineralized. There is edema in the proximal left thigh and hip soft tissues. IMPRESSION: 1. No acute fracture. 2. Recent proximal left femur fracture is well aligned with the compression screw an intramedullary rod, stable appearance from the operative images. Orthopedic hardware is well-seated. 3. No dislocation. Electronically Signed   By: Lajean Manes M.D.   On: 03/15/2016 12:11     Labs:  Basic Metabolic Panel:  Recent Labs Lab 03/23/16 0746 03/24/16 0801  NA 140 138  K 4.5 4.0  CL 104 102  CO2 29 28  GLUCOSE 103* 113*  BUN 28* 28*  CREATININE 0.84 0.94  CALCIUM 10.1 10.4*    CBC:  Recent Labs Lab 03/23/16 0746 03/26/16 0557  WBC 2.6* 2.6*  NEUTROABS 1.6*  --   HGB 10.5* 9.9*  HCT 33.3* 32.2*  MCV 94.3 95.0  PLT 178 186    CBG: No results for input(s): GLUCAP in the last 168 hours.  Brief HPI:   [name] is an 83 year old female with history of HTN, breast cancer, AAA, macular degeneration, OA who sustained a fall and laid on the floor for 3 hours due to inability to get up. She was admitted on 03/02/16 and found to have comminuted left IT hip fracture. She underwent IM nailing of hip by Dr. [name] on 12/19 and is WBAT. Post op with ABLA with drop in  Hgb from 12-->7.7. She was transfused with I unit PRBC and BP meds being held due to hypotension. Therapy ongoing and patient limited by difficulty weight bearing thorough LLE pain and inability to ambulate or perform ADL tasks.  CIR recommended for follow up therapy.    Hospital Course: LODIE ERBY was admitted to rehab 03/06/2016 for inpatient therapies to consist of PT and OT at least three hours five days a week. Past admission physiatrist, therapy team and rehab RN have worked together to provide customized collaborative inpatient rehab. BLE dopplers negative for DVT and SQ Lovenox was used for DVT prophylaxis during her stay. Blood pressures have been reasonably controlled and po intake has been good.  She was premedicated with oxycodone prior to therapy sessions to help with tolerance initially. Her endurance levels have been good and oxycodone has been discontinued. Pain is managed with scheduled tylenol.  She did report increase in swelling of left neck and ultrasound of area was negative for mass or adenopathy. Pain left shoulder/neck is managed with use of local measures. ABLA is resolving.   She has had issues with spasticity and baclofen was added to help with spasms. She has required encouragement to increase fluid intake and required fluid boluses due to AKI with significant hypotension. she was unable to tolerate increase in baclofen dose secondary to hypotension. Constipation has been managed with augmentation of bowel program. She was found to have neutropenia and case discussed with Hem/Onc who felt drop likely due to medications and hospitalization to monitor for now. Therefore colchicine was discontinued and repeat CBC shows stability. She has been making slow steady progress and currently requires min assist to supervision. Family is unable to provide care needed and has elected on SNF for further therapy.    Rehab course: During patient's stay in rehab weekly team conferences  were held to monitor patient's progress, set goals and discuss barriers to discharge.  At admission, patient required max assist with mobility and total assist with basic self care tasks. She has had improvement in activity tolerance, balance, postural control, as well as ability to compensate for deficits. She requires min assist for ADL tasks with use of AE. She requires min to mod assist with LLE for sit to supine and supervision for sit to stand transfers. She is able to ambulate 39' with min assist.     Disposition:  Skilled Nursing facility   Diet: Regular  Special Instructions: 1. Offer fluids/supplements between meals to maintain hydration/nutritional status. 2.  Recheck CBC next week to monitor WBC. Follow up with Heme/onc if WBC drops further.  3. Use ASA 81 mg daily for additional week to complete DVT prophylaxis course.    Allergies as of 03/27/2016      Reactions   Prednisone Swelling   Sulfa Antibiotics Swelling   Latex    Lidoderm [lidocaine]    Caused rash--question allergy to adhesive      Medication List    STOP taking these medications   colchicine 0.6 MG tablet   enoxaparin 30 MG/0.3ML  injection Commonly known as:  LOVENOX   HYDROcodone-acetaminophen 5-325 MG tablet Commonly known as:  NORCO/VICODIN   senna-docusate 8.6-50 MG tablet Commonly known as:  Senokot-S     TAKE these medications   acetaminophen 325 MG tablet Commonly known as:  TYLENOL Take 2 tablets (650 mg total) by mouth 4 (four) times daily -  with meals and at bedtime.   amLODipine 5 MG tablet Commonly known as:  NORVASC Take 1 tablet (5 mg total) by mouth daily.   amLODipine 5 MG tablet Commonly known as:  NORVASC Take 1 tablet (5 mg total) by mouth at bedtime.   aspirin EC 81 MG tablet Take 1 tablet (81 mg total) by mouth daily.   baclofen 10 MG tablet Commonly known as:  LIORESAL Take 0.5 tablets (5 mg total) by mouth 3 (three) times daily.   camphor-menthol  lotion Commonly known as:  SARNA Apply topically as needed for itching.   diclofenac sodium 1 % Gel Commonly known as:  VOLTAREN Apply 2 g topically 3 (three) times daily before meals.   diphenhydrAMINE 12.5 MG/5ML elixir Commonly known as:  BENADRYL Take 5-10 mLs (12.5-25 mg total) by mouth every 6 (six) hours as needed for itching.   escitalopram 10 MG tablet Commonly known as:  LEXAPRO Take 10 mg by mouth daily.   feeding supplement (ENSURE ENLIVE) Liqd Take 237 mLs by mouth daily at 3 pm.   feeding supplement (PRO-STAT SUGAR FREE 64) Liqd Take 30 mLs by mouth 2 (two) times daily.   fenofibrate 145 MG tablet Commonly known as:  TRICOR Take 145 mg by mouth daily.   hydrocerin Crea Apply 1 application topically 2 (two) times daily. To feet and shins   hydrochlorothiazide 12.5 MG capsule Commonly known as:  MICROZIDE Take 1 capsule (12.5 mg total) by mouth daily.   lidocaine 5 % Commonly known as:  LIDODERM Place 1 patch onto the skin daily. Remove & Discard patch within 12 hours or as directed by MD   methocarbamol 750 MG tablet Commonly known as:  ROBAXIN Take 1 tablet (750 mg total) by mouth every 6 (six) hours as needed for muscle spasms.   metoprolol succinate 25 MG 24 hr tablet Commonly known as:  TOPROL-XL Take 0.5 tablets (12.5 mg total) by mouth daily.   MUSCLE RUB 10-15 % Crea Apply 1 application topically 3 (three) times daily before meals. To back muscles   polyethylene glycol packet Commonly known as:  MIRALAX / GLYCOLAX Take 17 g by mouth daily. What changed:  when to take this  reasons to take this   rosuvastatin 10 MG tablet Commonly known as:  CRESTOR Take 10 mg by mouth daily.      Follow-up Information    Ankit Lorie Phenix, MD Follow up.   Specialty:  Physical Medicine and Rehabilitation Why:  office will call you with follow up appointment Contact information: [address] [place] [address] [phone]         Renette Butters, MD. Call.   Specialty:  Orthopedic Surgery Why:  for post op appointment Contact information: [address] [place] [address] [phone]           Signed: Bary Leriche 03/27/2016, 9:28 AM

## 2016-03-25 ENCOUNTER — Inpatient Hospital Stay (HOSPITAL_COMMUNITY): Payer: Medicare Other | Admitting: Occupational Therapy

## 2016-03-25 ENCOUNTER — Inpatient Hospital Stay (HOSPITAL_COMMUNITY): Payer: Medicare Other | Admitting: Physical Therapy

## 2016-03-25 DIAGNOSIS — S72009S Fracture of unspecified part of neck of unspecified femur, sequela: Secondary | ICD-10-CM

## 2016-03-25 DIAGNOSIS — M25519 Pain in unspecified shoulder: Secondary | ICD-10-CM

## 2016-03-25 DIAGNOSIS — R030 Elevated blood-pressure reading, without diagnosis of hypertension: Secondary | ICD-10-CM

## 2016-03-25 MED ORDER — AMLODIPINE BESYLATE 5 MG PO TABS
5.0000 mg | ORAL_TABLET | Freq: Every day | ORAL | Status: DC
  Administered 2016-03-25 – 2016-03-26 (×2): 5 mg via ORAL
  Filled 2016-03-25 (×2): qty 1
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
## 2016-03-25 NOTE — Progress Notes (Signed)
Physical Therapy Session Note  Patient Details  Name: [name] MRN: [id number] Date of Birth: 1927-02-27  Today's Date: 03/25/2016 PT Individual Time: [id number] PT Individual Time Calculation (min): 66 min    Short Term Goals: Week 3:  PT Short Term Goal 1 (Week 3): = LTGs due to anticipated LOS  Skilled Therapeutic Interventions/Progress Updates:     Pt was positioned in wheelchair upon arrival and stated having no pain before starting physical therapy. Pt self-propelled in wheelchair 118 ft with supervision out of room towards rehab gym to progress activity tolerance and independence with mobility. Pt performed one episode of ambulation with a RW CGA for a total distance of 30 ft with one turn at 15 ft to progress towards ambulation goal. Pt performed 2 episodes of standing tolerance with RW CGA while playing Connect 4 to improve strength, activity tolerance, and reduce pain. Pt reported no pain but increased fatigue after both bouts of standing tolerance. Pt was assisted with toileting after performing standing tolerance upon return to her room and then transferred back into her bed. Pt performed several sit-to-stand transfers throughout therapy to reposition into bed, perform toileting, and progress LE strength and activity tolerance.  Pt was left in bed with call bell within reach and verbalized no pain but fatigue.   Therapy Documentation Precautions:  Precautions Precautions: Fall Precaution Comments: L neck pain Restrictions Weight Bearing Restrictions: Yes LLE Weight Bearing: Weight bearing as tolerated   See Function Navigator for Current Functional Status.   Therapy/Group: Individual Therapy   Rosendo Gros 03/25/2016, 4:29 PM
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
## 2016-03-25 NOTE — Plan of Care (Signed)
Problem: RH Car Transfers Goal: LTG Patient will perform car transfers with assist (PT) LTG: Patient will perform car transfers with assistance (PT).  Outcome: Not Applicable Date Met: 82/06/01 DC 1/10, discharge changed to SNF  Problem: RH Ambulation Goal: LTG Patient will ambulate in home environment (PT) LTG: Patient will ambulate in home environment, # of feet with assistance (PT).  Outcome: Not Applicable Date Met: 56/15/37 DC 1/10, discharge changed to SNF

## 2016-03-25 NOTE — Progress Notes (Signed)
Social Work Patient ID: [name], female   DOB: 1927/01/16, 83 y.o.   MRN: [id number] Met with pt to discuss team conference progress and plan for Friday. Daughter and granddaughter came to take her flowers home And cards, so not lost in her transition. Pt feels better today and is not in as much pain, her shoulder still hurts from therapies though. Work on transfer for Friday.

## 2016-03-25 NOTE — Progress Notes (Signed)
Occupational Therapy Session Note  Patient Details  Name: [name] MRN: [id number] Date of Birth: [birth year]  Today's Date: 03/25/2016 OT Individual Time: 3546-5681 OT Individual Time Calculation (min): 88 min   Short Term Goals: Week 3:  OT Short Term Goal 1 (Week 3): Pt wil complete stand-piovt transfers consistently with Min A OT Short Term Goal 2 (Week 3): Pt will complete LB dressing with Min A consistently with use of ADL AE OT Short Term Goal 3 (Week 3): Pt will tolerate 6 minutes of standing activities in preparation for self-care tasks  Skilled Therapeutic Interventions/Progress Updates:    1:1 OT session focused on modified bathing/dressing with focus on LB dressing,  sit<>stands, standing endurance, and activity modifications. Pt declined shower today 2/2 fatigue, but request sponge bath at the sink. Pt utilized both UE's to wash hair using shampoo cap, combed her hair, but required assistance for blow drying 2/2 L UE fatigue. Pt demonstrated good carry over of modified LB dressing techniques and was able to don underwear and pants today with min guard A for balance when pulling pants over hips. Tolerated ~7 minutes at longest standing bout  4 sit<>stands during session with Min A, progressing to close supervision. Pt left seated in wc at end of session with needs met.    Therapy Documentation Precautions:  Precautions Precautions: Fall Precaution Comments: L neck pain Restrictions Weight Bearing Restrictions: Yes LLE Weight Bearing: Weight bearing as tolerated Pain: Pain Assessment Pain Assessment: 0-10 Pain Score: 4  Pain Type: Acute pain Pain Location: Leg Pain Orientation: Left Pain Descriptors / Indicators: Aching Pain Onset: With Activity Pain Intervention(s): Rest (premedicated) ADL: ADL ADL Comments: Please see functional navigator for ADL status  See Function Navigator for Current Functional Status.   Therapy/Group: Individual  Therapy  [name] 03/25/2016, 11:36 AM

## 2016-03-25 NOTE — Progress Notes (Addendum)
[facility]ICINE [facility] RE[facility]ABILITATION     PROGRESS NOTE  Subjective/Complaints: Pt laying in bed this AM.  She states that she is sleepy, but does not have any complaints overnight.   ROS: Denies CP, SOB, N/V/D.  Objective: Vital Signs: Blood pressure (!) 148/67, pulse 60, temperature 98 F (36.7 C), temperature source Oral, resp. rate 18, height 5\' 6"  (1.676 m), weight 98.2 kg (216 lb 9.6 oz), SpO2 97 %. Dg Chest 2 View  Result Date: 03/24/2016 CLINICAL DATA:  83 y/o  F; hypertension.  Former smoker. EXAM: CHEST  2 VIEW COMPARISON:  None. FINDINGS: Stable loss of height of the T8, T9 post kyphoplasty, and T12 vertebral bodies. Increased opacification at the right lung apex may be projectional due to patient's rotation. Blunted right costal diaphragmatic angle may represent a small effusion. Otherwise no focal consolidation. Left axillary surgical clips. Stable mild cardiomegaly. Multiple chronic rib fractures bilaterally. Aortic atherosclerosis with calcification. IMPRESSION: Increased opacification of right lung apex may be projectional due to patient's rotation. Underlying consolidation, atelectasis, or mass is not excluded. This can be further characterized with CT chest if clinically indicated. Electronically Signed   By: Kristine Garbe M.D.   On: 03/24/2016 20:57    Recent Labs  03/23/16 0746  WBC 2.6*  HGB 10.5*  HCT 33.3*  PLT 178    Recent Labs  03/23/16 0746 03/24/16 0801  NA 140 138  K 4.5 4.0  CL 104 102  GLUCOSE 103* 113*  BUN 28* 28*  CREATININE 0.84 0.94  CALCIUM 10.1 10.4*   CBG (last 3)  No results for input(s): GLUCAP in the last 72 hours.  Wt Readings from Last 3 Encounters:  03/18/16 98.2 kg (216 lb 9.6 oz)  03/03/16 90.3 kg (199 lb)  01/15/12 87.4 kg (192 lb 9.6 oz)    Physical Exam:  BP (!) 148/67 (BP Location: Right Arm)   Pulse 60   Temp 98 F (36.7 C) (Oral)   Resp 18   Ht 5\' 6"  (1.676 m)   Wt 98.2 kg (216 lb 9.6 oz)    SpO2 97%   BMI 34.96 kg/m  Constitutional: She appears well-developed. No distress. Obese.  HENT: Normocephalic and atraumatic.  Eyes: EOM are normal. No discharge.  Cardiovascular: RRR. No JVD. Respiratory: Effort normal and breath sounds normal.  GI: Soft. Bowel sounds are normal.  Midline and RLQ hernias - not examined today Musculoskeletal: left hip tender to palpation and simple movement of leg. Neurological: She is alert and oriented.  Motor: B/l deltoid, bicep, tricep, grip 5/5  RLE: 4/5 hip flexor, knee extensor, ankle dorsal flexor LLE: 3-/5 hip flexion, 3+/5 KE, (pain inhibition, unchanged), ankle dorsiflexion/plantar flexion 4+/5  Skin: Skin is warm and dry. She is not diaphoretic.  Hip incision c/d/i Psychiatric: Normal mood and affect.   Assessment/Plan: 1. Functional deficits secondary to left hip fracture which require 3+ hours per day of interdisciplinary therapy in a comprehensive inpatient rehab setting. Physiatrist is providing close team supervision and 24 hour management of active medical problems listed below. Physiatrist and rehab team continue to assess barriers to discharge/monitor patient progress toward functional and medical goals.  Function:  Bathing Bathing position   Position: Wheelchair/chair at sink  Bathing parts Body parts bathed by patient: Right arm, Left arm, Chest, Abdomen, Front perineal area, Right upper leg, Left upper leg Body parts bathed by helper: Buttocks, Back, Right lower leg, Left lower leg  Bathing assist Assist Level: Touching or steadying assistance(Pt >  75%) Assistive Device Comment: long-handled sponge Set up : To obtain items, To adjust water temperature  Upper Body Dressing/Undressing Upper body dressing   What is the patient wearing?: Bra, Hospital gown Bra - Perfomed by patient: Thread/unthread right bra strap, Thread/unthread left bra strap Bra - Perfomed by helper: Hook/unhook bra (pull down sports bra) Pull over  shirt/dress - Perfomed by patient: Thread/unthread right sleeve, Thread/unthread left sleeve, Put head through opening, Pull shirt over trunk Pull over shirt/dress - Perfomed by helper: Thread/unthread right sleeve, Thread/unthread left sleeve        Upper body assist Assist Level: Set up, Supervision or verbal cues   Set up : To obtain clothing/put away  Lower Body Dressing/Undressing Lower body dressing   What is the patient wearing?: Pants, Ted Hose, Shoes Underwear - Performed by patient: Thread/unthread right underwear leg, Thread/unthread left underwear leg, Pull underwear up/down Underwear - Performed by helper: Thread/unthread right underwear leg, Thread/unthread left underwear leg, Pull underwear up/down Pants- Performed by patient: Thread/unthread right pants leg, Thread/unthread left pants leg, Pull pants up/down Pants- Performed by helper: Pull pants up/down Non-skid slipper socks- Performed by patient: Don/doff right sock, Don/doff left sock Non-skid slipper socks- Performed by helper: Don/doff right sock, Don/doff left sock Socks - Performed by patient: Don/doff right sock, Don/doff left sock Socks - Performed by helper: Don/doff right sock, Don/doff left sock Shoes - Performed by patient: Don/doff right shoe, Don/doff left shoe (doffed shoes with no assist) Shoes - Performed by helper: Don/doff right shoe, Don/doff left shoe       TED Hose - Performed by helper: Don/doff left TED hose, Don/doff right TED hose  Lower body assist Assist for lower body dressing: Touching or steadying assistance (Pt > 75%) Assistive Device Comment: reacher, sock-aid, long-handled shoe horn    Toileting Toileting   Toileting steps completed by patient: Adjust clothing prior to toileting, Performs perineal hygiene Toileting steps completed by helper: Performs perineal hygiene, Adjust clothing after toileting Toileting Assistive Devices: Grab bar or rail  Toileting assist Assist level:  Touching or steadying assistance (Pt.75%)   Transfers Chair/bed transfer   Chair/bed transfer method: Ambulatory Chair/bed transfer assist level: Touching or steadying assistance (Pt > 75%) Chair/bed transfer assistive device: Armrests, Environmental manager lift: Ecologist Ambulation activity did not occur: Safety/medical concerns   Max distance: 25 ft Assist level: Touching or steadying assistance (Pt > 75%)   Wheelchair   Type: Manual Max wheelchair distance: 150 ft Assist Level: Supervision or verbal cues  Cognition Comprehension Comprehension assist level: Understands complex 90% of the time/cues 10% of the time  Expression Expression assist level: Expresses complex 90% of the time/cues < 10% of the time  Social Interaction Social Interaction assist level: Interacts appropriately with others with medication or extra time (anti-anxiety, antidepressant).  Problem Solving Problem solving assist level: Solves basic problems with no assist  Memory Memory assist level: Recognizes or recalls 75 - 89% of the time/requires cueing 10 - 24% of the time    Medical Problem List and Plan: 1.  Gait abnormality, difficulty with transfers, poor activity tolerance secondary to left hip fracture.  Cont CIR therapies, plan for SNF now             Xray hip 12/31 reviewed, stable post-surgical  CXR 1/9 reviewed with ?right lung abnormality, no indication for further workup at this time, likely positional. Discussed with PA.  2.  DVT Prophylaxis/Anticoagulation: Mechanical: Sequential compression devices, below knee Bilateral lower  extremities Pharmaceutical: Lovenox  Vascular study neg and limited on 12/23 for DVT 3. Pain Management: Premedicate prior to therapy sessions.   Added voltaren gel to left shoulder   Sportscreme to back for chronic back pain.    Scheduled tylenol qid.   Robaxin PRN   ice/heat in addition to oxycodone/robaxin and above  Lidoderm patch added 1/3 to  shoulder/neck, hip 1/4  Baclofen 5 TID started 1/3 4. Mood: Team to provide ego support. LCSW to follow for evaluation and support.  5. Neuropsych: This patient is capable of making decisions on his own behalf. 6. Skin/Wound Care: Monitor wound daily for healing  7. Fluids/Electrolytes/Nutrition: Monitor I/O.  8.  ABLA: Added iron supplement.   Hb 10.5 on 1/9  Cont to monitor 9. Abdominal pain/constipation:   KUB reviewed 12/23, unremarkable   Increased miralax to bid.  10. HTN: Monitor bid   Norvasc 5 started 1/3 changed to qHS  Cont HCTZ 12.5  Cont Toprolol 12.5  Will monitor with increased activity  Slightly labile with elevated AM readings, overall controlled 1/10  Orthostatics WNL 11. AKI: On HCTZ at home: Improved  BUN/Scr 28/1.22 at admission. Encourage fluid intake.    Cr. 0. 94 on 1/9  Labs ordered for tomorrow  Cont encourage fluid intake  IVF bolused on 12/27, ordered qhs IVF started 1/2, d/ced 1/4  Will cont to monitor 12. Thrombocytopenia: Resolved  Likely reactive.  13. Hypoalbuminemia  Supplement initiated 12/23 14. Leukopenia:   WBCs 2.6 on 1/8, with low Neutrophil count  Per Heme/Onc, likely secondary to hospitalization and medications, Colchicine d/ced, monitor.  Appreciate recs  Labs ordered for tomorrow  Will cont to monitor 15. Left neck/shoulder pain with history of breast CA and mastectomy  U/S negative  Voltaren gel ordered  Xray shoulder reviewed, unremarkable  Premedicate before therapies, discussed with therapies as well.  Scheduled Robaxin, increased dose on 12/29, changed back to PRN due to hallucinations/disorientation on 1/2, resolved.  Cont heat  LOS (Days) 19 A FACE TO FACE EVALUATION WAS PERFORMED  Seara Hinesley Lorie Phenix 03/25/2016 7:50 AM

## 2016-03-25 NOTE — Progress Notes (Signed)
Recreational Therapy Session Note  Patient Details  Name: [name] MRN: [id number] Date of Birth: 1926/05/11 Today's Date: 03/25/2016  Pain: 4/10 LLE,premedicated. Rest breaks given PRN. Skilled Therapeutic Interventions/Progress Updates: Session focused on activity tolerance, ambulation using RW, safety awareness.  Pt propelled w/c using BUEs with supervision, ambulated in narrow carpeted spaces using RW with contact guard assist for 25' & then 10'.  Pt required seated rest breaks due to fatigue.  Also discussed discharge planning as pt is to discharge to SNF on Friday.  Therapy/Group: Co-Treatment  [name] 03/25/2016, 11:20 AM

## 2016-03-25 NOTE — Progress Notes (Signed)
Physical Therapy Session Note  Patient Details  Name: [name] MRN: [id number] Date of Birth: [birth year]  Today's Date: 03/25/2016 PT Individual Time: 0907-1000 PT Individual Time Calculation (min): 53 min    Short Term Goals: Week 3:  PT Short Term Goal 1 (Week 3): = LTGs due to anticipated LOS  Skilled Therapeutic Interventions/Progress Updates:    Patient in wheelchair after receiving medications. Patient propelled wheelchair using BUE x 150 ft with supervision and increased time. Patient performed sit <> stand transfers using RW with supervision-steady assist and increased time. In gift shop, patient ambulated through narrow spaces on carpeted surfaces using RW x 25 ft + 10 ft with steady assist and required seated rest breaks due to fatigue and LLE stiffness. Performed NuStep using RUE and BLE at level 2 x 10 min to facilitate LLE ROM and strengthening. Patient left sitting in wheelchair with needs in reach.   Therapy Documentation Precautions:  Precautions Precautions: Fall Precaution Comments: L neck pain Restrictions Weight Bearing Restrictions: Yes LLE Weight Bearing: Weight bearing as tolerated Pain: Pain Assessment Pain Assessment: 0-10 Pain Score: 4  Pain Type: Acute pain Pain Location: Leg Pain Orientation: Left Pain Descriptors / Indicators: Aching Pain Onset: With Activity Pain Intervention(s): Rest (premedicated)  See Function Navigator for Current Functional Status.   Therapy/Group: Individual Therapy  Susy Placzek, Murray Hodgkins 03/25/2016, 9:52 AM

## 2016-03-25 NOTE — Plan of Care (Signed)
Problem: RH Bathing Goal: LTG Patient will bathe with assist, cues/equipment (OT) LTG: Patient will bathe specified number of body parts with assist with/without cues using equipment (position)  (OT)  Goal downgraded 1/10-ESD  Problem: RH Dressing Goal: LTG Patient will perform lower body dressing w/assist (OT) LTG: Patient will perform lower body dressing with assist, with/without cues in positioning using equipment (OT)  Goal Downgraded 1/10 -ESD  Problem: RH Toilet Transfers Goal: LTG Patient will perform toilet transfers w/assist (OT) LTG: Patient will perform toilet transfers with assist, with/without cues using equipment (OT)  Downgraded 1/10 2/2 slow progress- ESD  Problem: RH Tub/Shower Transfers Goal: LTG Patient will perform tub/shower transfers w/assist (OT) LTG: Patient will perform tub/shower transfers with assist, with/without cues using equipment (OT)  Outcome: Not Applicable Date Met: 06/29/91 D/c 1/10 2/2 slow progress and requests to only sponge bathe-EDS 1/10

## 2016-03-26 ENCOUNTER — Inpatient Hospital Stay (HOSPITAL_COMMUNITY): Payer: Medicare Other | Admitting: Physical Therapy

## 2016-03-26 ENCOUNTER — Inpatient Hospital Stay (HOSPITAL_COMMUNITY): Payer: Medicare Other | Admitting: Occupational Therapy

## 2016-03-26 LAB — CBC
HCT: 32.2 % — ABNORMAL LOW (ref 36.0–46.0)
Hemoglobin: 9.9 g/dL — ABNORMAL LOW (ref 12.0–15.0)
MCH: 29.2 pg (ref 26.0–34.0)
MCHC: 30.7 g/dL (ref 30.0–36.0)
MCV: 95 fL (ref 78.0–100.0)
PLATELETS: 186 10*3/uL (ref 150–400)
RBC: 3.39 MIL/uL — AB (ref 3.87–5.11)
RDW: 15.4 % (ref 11.5–15.5)
WBC: 2.6 10*3/uL — ABNORMAL LOW (ref 4.0–10.5)

## 2016-03-26 MED ORDER — OXYCODONE HCL 5 MG PO TABS: 5.0000 mg | ORAL_TABLET | Freq: Four times a day (QID) | ORAL | Status: DC | PRN

## 2016-03-26 NOTE — Progress Notes (Signed)
Physical Therapy Discharge Summary  Patient Details  Name: [name] MRN: [id number] Date of Birth: [birth year]  Today's Date: 03/26/2016 PT Individual Time: 11:05-11:50 & 1415-1501 PT Individual Time Calculation (min):45 min & 46 min    Patient has met 6 of 6 long term goals due to improved activity tolerance, increased strength and decreased pain.  Patient to discharge at an ambulatory level Supervision with RW.   Patient's care partner unavailable to provide the necessary physical assistance at discharge.  Reasons goals not met: N/A  Recommendation:  Patient will benefit from ongoing skilled PT services in skilled nursing facility setting to continue to advance safe functional mobility, address ongoing impairments in functional strength, pain, activity tolerance, and minimize fall risk.  Equipment: No equipment provided. Pt will be provided with necessary equipment at discharged location.  Reasons for discharge: treatment goals met  Patient/family agrees with progress made and goals achieved: Yes  Treatment 1:  time: 11:05-11:50.   Pt is supervision with sit-to-stand and stand-to-sit with minimal cues for handplacement. Pt is min assist with car transfer with lifting LLE. Pt is CGA with ambulation with a RW for safety. Pt is supervision with WC mobility with minimal cues for handplacement. Pt was left sitting in WC in her room with call bell within reach and all other needs met.   Treatment 2:   Time: 14:15-15:01 Patient was min assist with sit-to-supine and supine-to-sit with lifting LLE on and off mat/bed. Once supine, pt performed supine therapeutic exercises to work on hip mobility, strength, and to reduce pain. Pt performed 2x10 supine hip flexions and 1x10 supine hip abduction bilaterally with min assist provided by therapist on LLE. Pt was left in [facility] in her room with call bell within reach and all other needs met prior to leaving room.   Pt had no other questions  about plan for discharge after today's sessions.     PT Discharge Precautions/RestrictionsPrecautions Precautions: Fall Precaution Comments: L neck pain Restrictions Weight Bearing Restrictions: Yes LLE Weight Bearing: Weight bearing as tolerated Vital Signs Therapy Vitals Temp: 97.8 F (36.6 C) (right doc) Temp Source: Oral Pulse Rate: 79 Resp: 16 BP: (!) 118/48 Patient Position (if appropriate): Lying Oxygen Therapy SpO2: 98 % O2 Device: Not Delivered Pain Pain Assessment Pain Assessment: 0-10 Pain Score: 7  Pain Type: Acute pain Pain Location: Hip Pain Orientation: Left Pain Descriptors / Indicators: Aching Pain Onset: With Activity Pain Intervention(s): Rest;Repositioned Multiple Pain Sites: No Vision/Perception   no change from baseline  Cognition Overall Cognitive Status: Within Functional Limits for tasks assessed Arousal/Alertness: Awake/alert Orientation Level: Oriented X4 Attention: Sustained Sustained Attention: Appears intact Memory: Appears intact Awareness: Appears intact Problem Solving: Appears intact Safety/Judgment: Appears intact Sensation Sensation Light Touch: Appears Intact Stereognosis: Not tested Hot/Cold: Appears Intact Proprioception: Appears Intact Coordination Gross Motor Movements are Fluid and Coordinated: No Fine Motor Movements are Fluid and Coordinated: Yes Coordination and Movement Description: deficits secondary to pain and swelling Motor  Motor Motor: Abnormal postural alignment and control  Mobility Bed Mobility Bed Mobility: Sit to Supine;Supine to Sit Supine to Sit: 4: Min assist Supine to Sit: Patient Percentage: 70% Supine to Sit Details: Manual facilitation for placement;Verbal cues for sequencing;Verbal cues for technique;Other (Comment) Supine to Sit Details (indicate cue type and reason): min assist to lift LLE into bed Sit to Supine: 4: Min assist Sit to Supine - Details: Verbal cues for sequencing;Verbal  cues for technique;Manual facilitation for placement Sit to Supine - Details (indicate cue  type and reason): min assist to lift LLE Transfers Transfers: Yes Sit to Stand: 5: Supervision;With armrests;From bed Sit to Stand Details: Verbal cues for sequencing;Verbal cues for technique Sit to Stand Details (indicate cue type and reason): occasional cueing for hand placement Stand to Sit: 5: Supervision Stand to Sit Details (indicate cue type and reason): Verbal cues for sequencing;Verbal cues for technique Stand to Sit Details: occasional cues for hand placement Stand Pivot Transfers: 5: Supervision;With armrests;Other (comment);6: Modified independent (Device/Increase time) Stand Pivot Transfer Details: Verbal cues for sequencing;Verbal cues for technique Stand Pivot Transfer Details (indicate cue type and reason): used walker with transfers; occasional verbal cues for sequence and handplacement Locomotion  Ambulation Ambulation: Yes Ambulation/Gait Assistance: 4: Min guard Ambulation Distance (Feet): 35 Feet Assistive device: Rolling walker Gait Gait: Yes Gait Pattern: Step-to pattern;Decreased stride length;Decreased stance time - left;Trunk flexed Stairs / Additional Locomotion Stairs: No Architect: Yes Wheelchair Assistance: 5: Investment banker, operational Details: Verbal cues for Marketing executive: Both upper extremities Wheelchair Parts Management: Needs assistance Distance: 80 ft  Trunk/Postural Assessment  Cervical Assessment Cervical Assessment: Exceptions to [facility] (forward head) Thoracic Assessment Thoracic Assessment: Exceptions to [facility] (increased kyphotic posture) Lumbar Assessment Lumbar Assessment: Exceptions to [facility] (decreased lumbar extension; posterior pelvic tilt) Postural Control Postural Control: Deficits on evaluation  Balance Balance Balance Assessed: No Static Sitting Balance Static Sitting - Balance  Support: No upper extremity supported Static Sitting - Level of Assistance: 6: Modified independent (Device/Increase time) Dynamic Sitting Balance Dynamic Sitting - Balance Support: Left upper extremity supported;Feet supported Dynamic Sitting - Level of Assistance: 5: Stand by assistance Static Standing Balance Static Standing - Level of Assistance: 6: Modified independent (Device/Increase time) Extremity Assessment      RLE Assessment RLE Assessment: Within Functional Limits LLE Assessment LLE Assessment: Exceptions to [facility] LLE Strength LLE Overall Strength: Deficits LLE Overall Strength Comments: hip flexion 2+/5, knee flexion/extension 3+/5, ankle DF/PF 4/5   See Function Navigator for Current Functional Status.  Rosendo Gros 03/26/2016, 4:50 PM

## 2016-03-26 NOTE — Progress Notes (Signed)
Recreational Therapy Discharge Summary Patient Details  Name: [name] MRN: [id number] Date of Birth: May 29, 1926 Today's Date: 03/26/2016  Long term goals set: 1  Long term goals met: 1  Comments on progress toward goals: Pt has made good progress and is ready for discharge to SNF tomorrow for continued therapies and 24 hour supervision/assistance.  Pt met supervision level for simple TR tasks given extra time and use of AE.  Reasons for discharge: discharge from hospital  Follow-up: Encourage participation in [place] or Activities Program offferd by SNF  Patient/family agrees with progress made and goals achieved: Yes  Tarryn Bogdan 03/26/2016, 8:18 AM

## 2016-03-26 NOTE — Progress Notes (Signed)
Occupational Therapy Discharge Summary  Patient Details  Name: [name] MRN: [id number] Date of Birth: [birth year]  Today's Date: 03/26/2016 OT Individual Time: 1300-1345 OT Individual Time Calculation (min): 45 min   Patient has met 8 of 8 long term goals due to improved activity tolerance, improved balance, postural control and ability to compensate for deficits.  Patient to discharge at overall Min Assist/supervision level.  Patient's care to discharge to skilled nursing facility for continued occupational therapy in next venue of care.     Reasons goals not met: n/a  Recommendation:  Patient will benefit from ongoing skilled OT services in skilled nursing facility setting to continue to advance functional skills in the area of BADL.  Equipment: No equipment provided  Reasons for discharge: treatment goals met and discharge from hospital  Patient/family agrees with progress made and goals achieved: Yes    Skilled Therapeutic Interventions: 1:1 OT session focused on transfer training, toileting, and standing endurance. Stand-pivot transfer <>toielt with close supervision. Pt voided bowel and bladder w/ set-up for toiletng. Pt tolerated ~ 8 minutes standing  while playing card game. Pt returned to room and left with needs met.   OT Discharge Precautions/Restrictions  Precautions Precautions: Fall Precaution Comments: L neck pain Restrictions Weight Bearing Restrictions: Yes LLE Weight Bearing: Weight bearing as tolerated Pain Pain Assessment Pain Assessment: 0-10 Pain Score: 2  Pain Type: Acute pain Pain Location: Hip Pain Orientation: Left Pain Descriptors / Indicators: Aching Pain Onset: With Activity ADL ADL Equipment Provided: Reacher, Sock aid, Long-handled shoe horn, Long-handled sponge Eating: Independent Where Assessed-Eating: Wheelchair Grooming: Independent Where Assessed-Grooming: Wheelchair Upper Body Bathing: Modified independent Where  Assessed-Upper Body Bathing: Sitting at sink Lower Body Bathing: Minimal assistance Where Assessed-Lower Body Bathing: Standing at sink, Sitting at sink Upper Body Dressing: Modified independent (Device) Lower Body Dressing: Minimal assistance Where Assessed-Lower Body Dressing: Sitting at sink Toileting: Supervision/safety Where Assessed-Toileting: Glass blower/designer: Close supervision Toilet Transfer Method: Arts development officer: Raised toilet seat, Grab bars ADL Comments: Please see functional navigator Vision/Perception  Perception Comments: WFL  Cognition Overall Cognitive Status: Within Functional Limits for tasks assessed Arousal/Alertness: Awake/alert Orientation Level: Oriented X4 Attention: Sustained Sustained Attention: Appears intact Memory: Appears intact Awareness: Appears intact Problem Solving: Appears intact Safety/Judgment: Appears intact Sensation Sensation Light Touch: Appears Intact Stereognosis: Appears Intact Hot/Cold: Appears Intact Proprioception: Appears Intact Coordination Gross Motor Movements are Fluid and Coordinated: No Fine Motor Movements are Fluid and Coordinated: Yes Motor  Motor Motor: Abnormal postural alignment and control Mobility  Transfers Sit to Stand: 5: Supervision;With armrests;From bed Sit to Stand Details: Verbal cues for sequencing;Verbal cues for technique Sit to Stand Details (indicate cue type and reason): occasional cueing for hand placement Stand to Sit: 5: Supervision Stand to Sit Details (indicate cue type and reason): Verbal cues for sequencing;Verbal cues for technique Stand to Sit Details: occasional cues for hand placement   Balance Balance Balance Assessed: No Static Sitting Balance Static Sitting - Balance Support: No upper extremity supported Static Sitting - Level of Assistance: 6: Modified independent (Device/Increase time) Dynamic Sitting Balance Dynamic Sitting - Balance Support:  Left upper extremity supported;Feet supported Dynamic Sitting - Level of Assistance: 5: Stand by assistance Dynamic Sitting - Balance Activities: Reaching across midline Sitting balance - Comments: Prefers BUE support but able to sit briefly without it for ADL. Static Standing Balance Static Standing - Balance Support: Bilateral upper extremity supported Static Standing - Level of Assistance: 6: Modified independent (Device/Increase time) Dynamic  Standing Balance Dynamic Standing - Balance Support: During functional activity Dynamic Standing - Level of Assistance: 2: Max assist Extremity/Trunk Assessment RUE Assessment RUE Assessment: Within Functional Limits LUE Assessment LUE Assessment: Within Functional Limits   See Function Navigator for Current Functional Status.  [name] 03/26/2016, 8:25 PM

## 2016-03-26 NOTE — Progress Notes (Signed)
[facility]     PROGRESS NOTE  Subjective/Complaints: Pt laying in bed this AM.  She states she is sleepy and that her neck is sore. Overall, she is doing well and looking forward to discharge tomorrow.  ROS: Denies CP, SOB, N/V/D.  Objective: Vital Signs: Blood pressure (!) 143/72, pulse 71, temperature 97.9 F (36.6 C), temperature source Oral, resp. rate 18, height 5\' 6"  (1.676 m), weight 98.2 kg (216 lb 9.6 oz), SpO2 98 %. Dg Chest 2 View  Result Date: 03/24/2016 CLINICAL DATA:  83 y/o  F; hypertension.  Former smoker. EXAM: CHEST  2 VIEW COMPARISON:  None. FINDINGS: Stable loss of height of the T8, T9 post kyphoplasty, and T12 vertebral bodies. Increased opacification at the right lung apex may be projectional due to patient's rotation. Blunted right costal diaphragmatic angle may represent a small effusion. Otherwise no focal consolidation. Left axillary surgical clips. Stable mild cardiomegaly. Multiple chronic rib fractures bilaterally. Aortic atherosclerosis with calcification. IMPRESSION: Increased opacification of right lung apex may be projectional due to patient's rotation. Underlying consolidation, atelectasis, or mass is not excluded. This can be further characterized with CT chest if clinically indicated. Electronically Signed   By: Kristine Garbe M.D.   On: 03/24/2016 20:57    Recent Labs  03/26/16 0557  WBC 2.6*  HGB 9.9*  HCT 32.2*  PLT 186    Recent Labs  03/24/16 0801  NA 138  K 4.0  CL 102  GLUCOSE 113*  BUN 28*  CREATININE 0.94  CALCIUM 10.4*   CBG (last 3)  No results for input(s): GLUCAP in the last 72 hours.  Wt Readings from Last 3 Encounters:  03/18/16 98.2 kg (216 lb 9.6 oz)  03/03/16 90.3 kg (199 lb)  01/15/12 87.4 kg (192 lb 9.6 oz)    Physical Exam:  BP (!) 143/72   Pulse 71   Temp 97.9 F (36.6 C) (Oral)   Resp 18   Ht 5\' 6"  (1.676 m)   Wt 98.2 kg (216 lb 9.6 oz)   SpO2 98%   BMI 34.96  kg/m  Constitutional: She appears well-developed. No distress. Obese.  HENT: Normocephalic and atraumatic.  Eyes: EOM are normal. No discharge.  Cardiovascular: RRR. No JVD. Respiratory: Effort normal and breath sounds normal.  GI: Soft. Bowel sounds are normal.  Midline and RLQ hernias - not examined today Musculoskeletal: left hip tender to palpation and simple movement of leg. Neurological: She is alert and oriented.  Motor: B/l deltoid, bicep, tricep, grip 5/5  RLE: 4/5 hip flexor, knee extensor, ankle dorsal flexor LLE: 3-/5 hip flexion, 3+/5 KE, (pain inhibition, stable), ankle dorsiflexion/plantar flexion 4+/5  Skin: Skin is warm and dry. She is not diaphoretic.  Hip incision c/d/i Psychiatric: Normal mood and affect.   Assessment/Plan: 1. Functional deficits secondary to left hip fracture which require 3+ hours per day of interdisciplinary therapy in a comprehensive inpatient rehab setting. Physiatrist is providing close team supervision and 24 hour management of active medical problems listed below. Physiatrist and rehab team continue to assess barriers to discharge/monitor patient progress toward functional and medical goals.  Function:  Bathing Bathing position   Position: Wheelchair/chair at sink  Bathing parts Body parts bathed by patient: Right arm, Left arm, Chest, Abdomen, Front perineal area, Right upper leg, Buttocks, Left upper leg Body parts bathed by helper: Right lower leg, Left lower leg, Back  Bathing assist Assist Level: Touching or steadying assistance(Pt > 75%) Assistive Device Comment:  long-handled sponge Set up : To obtain items  Upper Body Dressing/Undressing Upper body dressing   What is the patient wearing?: Pull over shirt/dress Bra - Perfomed by patient: Thread/unthread right bra strap, Thread/unthread left bra strap Bra - Perfomed by helper: Hook/unhook bra (pull down sports bra) Pull over shirt/dress - Perfomed by patient: Thread/unthread  right sleeve, Thread/unthread left sleeve, Put head through opening, Pull shirt over trunk Pull over shirt/dress - Perfomed by helper: Thread/unthread right sleeve, Thread/unthread left sleeve        Upper body assist Assist Level: More than reasonable time   Set up : To obtain clothing/put away  Lower Body Dressing/Undressing Lower body dressing   What is the patient wearing?: Pants, Underwear, Socks, Shoes Underwear - Performed by patient: Thread/unthread right underwear leg, Thread/unthread left underwear leg, Pull underwear up/down Underwear - Performed by helper: Thread/unthread right underwear leg, Thread/unthread left underwear leg, Pull underwear up/down Pants- Performed by patient: Thread/unthread right pants leg, Thread/unthread left pants leg, Pull pants up/down Pants- Performed by helper: Pull pants up/down Non-skid slipper socks- Performed by patient: Don/doff right sock, Don/doff left sock Non-skid slipper socks- Performed by helper: Don/doff right sock, Don/doff left sock Socks - Performed by patient: Don/doff right sock, Don/doff left sock Socks - Performed by helper: Don/doff right sock, Don/doff left sock Shoes - Performed by patient: Don/doff right shoe, Don/doff left shoe (doffed shoes with no assist) Shoes - Performed by helper: Don/doff right shoe, Don/doff left shoe       TED Hose - Performed by helper: Don/doff left TED hose, Don/doff right TED hose  Lower body assist Assist for lower body dressing: Touching or steadying assistance (Pt > 75%) Assistive Device Comment: Engineer, maintenance (IT)   Toileting steps completed by patient: Performs perineal hygiene Toileting steps completed by helper: Adjust clothing prior to toileting, Adjust clothing after toileting, Performs perineal hygiene Toileting Assistive Devices: Grab bar or rail, Other (comment) (RW)  Toileting assist Assist level: Supervision or verbal cues   Transfers Chair/bed transfer    Chair/bed transfer method: Stand pivot Chair/bed transfer assist level: Touching or steadying assistance (Pt > 75%) Chair/bed transfer assistive device: Armrests, Environmental manager lift: Architectural technologist activity did not occur: Safety/medical concerns   Max distance: 35 ft Assist level: Touching or steadying assistance (Pt > 75%)   Wheelchair   Type: Manual Max wheelchair distance: 80 ft Assist Level: Supervision or verbal cues  Cognition Comprehension Comprehension assist level: Understands complex 90% of the time/cues 10% of the time  Expression Expression assist level: Expresses complex 90% of the time/cues < 10% of the time  Social Interaction Social Interaction assist level: Interacts appropriately with others with medication or extra time (anti-anxiety, antidepressant).  Problem Solving Problem solving assist level: Solves basic problems with no assist  Memory Memory assist level: Recognizes or recalls 75 - 89% of the time/requires cueing 10 - 24% of the time    Medical Problem List and Plan: 1.  Gait abnormality, difficulty with transfers, poor activity tolerance secondary to left hip fracture.  Cont CIR therapies, plan for SNF now             Xray hip 12/31 reviewed, stable post-surgical 2.  DVT Prophylaxis/Anticoagulation: Mechanical: Sequential compression devices, below knee Bilateral lower extremities Pharmaceutical: Lovenox  Vascular study neg and limited on 12/23 for DVT 3. Pain Management: Premedicate prior to therapy sessions.   Added voltaren gel to left shoulder   Sportscreme to  back for chronic back pain.    Scheduled tylenol qid.   Robaxin PRN   ice/heat in addition to oxycodone/robaxin and above  Lidoderm patch added 1/3 to shoulder/neck, hip 1/4 - reaction with erythema  Baclofen 5 TID started 1/3 4. Mood: Team to provide ego support. LCSW to follow for evaluation and support.  5. Neuropsych: This patient is capable of making  decisions on his own behalf. 6. Skin/Wound Care: Monitor wound daily for healing  7. Fluids/Electrolytes/Nutrition: Monitor I/O.  8.  ABLA: Added iron supplement.   Hb 9.9 on 1/11  Cont to monitor 9. Abdominal pain/constipation:   KUB reviewed 12/23, unremarkable   Increased miralax to bid.  10. HTN: Monitor bid   Norvasc 5 started 1/3 changed to qHS  Cont HCTZ 12.5  Cont Toprolol 12.5  Will monitor with increased activity  Slightly elevated, but improved 1/11  Orthostatics WNL 11. AKI: On HCTZ at home: Improved  BUN/Scr 28/1.22 at admission. Encourage fluid intake.    Cr. 0. 94 on 1/9  Cont encourage fluid intake  IVF bolused on 12/27, ordered qhs IVF started 1/2, d/ced 1/4  Will cont to monitor 12. Thrombocytopenia: Resolved  Likely reactive.  13. Hypoalbuminemia  Supplement initiated 12/23 14. Leukopenia:   WBCs 2.6 on 1/11,   Per Heme/Onc, likely secondary to hospitalization and medications, Colchicine d/ced, monitor.  Appreciate recs  Will cont to monitor 15. Left neck/shoulder pain with history of breast CA and mastectomy  U/S negative  Voltaren gel ordered  Xray shoulder reviewed, unremarkable  Premedicate before therapies, discussed with therapies as well.  Scheduled Robaxin, increased dose on 12/29, changed back to PRN due to hallucinations/disorientation on 1/2, resolved.  Cont heat  LOS (Days) 20 A FACE TO FACE EVALUATION WAS PERFORMED  Ankit Lorie Phenix 03/26/2016 12:34 PM

## 2016-03-26 NOTE — Progress Notes (Signed)
Occupational Therapy Session Note  Patient Details  Name: MALASHA KLEPPE MRN: [id number] Date of Birth: [birth year]  Today's Date: 03/26/2016 OT Individual Time: 0945-1100 OT Individual Time Calculation (min): 75 min     Short Term Goals:Week 2:  OT Short Term Goal 1 (Week 2): Pt will complete bathing with Min A and AE as needed OT Short Term Goal 1 - Progress (Week 2): Met OT Short Term Goal 2 (Week 2): Pt will complete LB dressing with Mod A and AE as needed OT Short Term Goal 2 - Progress (Week 2): Met OT Short Term Goal 3 (Week 2): Pt will complete toileting tasks with 1 helper OT Short Term Goal 3 - Progress (Week 2): Met OT Short Term Goal 4 (Week 2): Pt will completed sit<>stand with Mod A for self-care tasks at the sink.  OT Short Term Goal 4 - Progress (Week 2): Met Week 3:  OT Short Term Goal 1 (Week 3): Pt wil complete stand-piovt transfers consistently with Min A OT Short Term Goal 2 (Week 3): Pt will complete LB dressing with Min A consistently with use of ADL AE OT Short Term Goal 3 (Week 3): Pt will tolerate 6 minutes of standing activities in preparation for self-care tasks  Skilled Therapeutic Interventions/Progress Updates:    Pt seen for ADL training to prepare for her discharge to a SNF tomorrow.  Pt received in bed on bed pan, pan removed and pt sat to EOB to transfer to W/c to go to sink so she could cleanse thoroughly.  At sink, pt completed all grooming, bathing, and dressing. She needed A to wash feet thoroughly and to don socks and shoes.  Use reacher well to don underwear/pants over feet. Supervision with sit to stand and standing balance several times during session. Ambulated into bathroom with RW with extra time needed due to moving slowly from hip pain and steadying A to transfer to toilet.  toileting with S.  Reviewed pt's progress and what to focus on in rehab at SNF to work on returning to her home. Pt stated she is feeling good about her progress.  Pt  in room with all needs met.   Therapy Documentation Precautions:  Precautions Precautions: Fall Precaution Comments: L neck pain Restrictions Weight Bearing Restrictions: (P) Yes LLE Weight Bearing: (P) Weight bearing as tolerated    Vital Signs: Therapy Vitals Pulse Rate: 71 BP: (!) 143/72 Pain: Pain Assessment Pain Assessment: 0-10 Pain Score: 2  Pain Type: Acute pain Pain Location: Hip Pain Orientation: Left Pain Descriptors / Indicators: Aching Pain Onset: With Activity ADL: ADL ADL Comments: Please see functional navigator for ADL status  See Function Navigator for Current Functional Status.   Therapy/Group: Individual Therapy  Bryne Lindon 03/26/2016, 10:14 AM

## 2016-03-27 MED ORDER — ASPIRIN EC 81 MG PO TBEC: 81.0000 mg | DELAYED_RELEASE_TABLET | Freq: Every day | ORAL | Status: DC

## 2016-03-27 MED ORDER — METHOCARBAMOL 750 MG PO TABS: 750.0000 mg | ORAL_TABLET | Freq: Four times a day (QID) | ORAL | Status: DC | PRN

## 2016-03-27 MED ORDER — AMLODIPINE BESYLATE 5 MG PO TABS: 5.0000 mg | ORAL_TABLET | Freq: Every day | ORAL | Status: DC

## 2016-03-27 MED ORDER — METOPROLOL SUCCINATE ER 25 MG PO TB24: 12.5000 mg | ORAL_TABLET | Freq: Every day | ORAL | Status: AC

## 2016-03-27 MED ORDER — DIPHENHYDRAMINE HCL 12.5 MG/5ML PO ELIX: 12.5000 mg | ORAL_SOLUTION | Freq: Four times a day (QID) | ORAL | 0 refills | Status: DC | PRN

## 2016-03-27 NOTE — Progress Notes (Signed)
[facility] PHYSICAL MEDICINE & REHABILITATION     PROGRESS NOTE  Subjective/Complaints: Pt seen laying in bed this AM.  She slept well overnight.  She is ready for discharge.   ROS: Denies CP, SOB, N/V/D.  Objective: Vital Signs: Blood pressure 116/82, pulse 80, temperature 97.8 F (36.6 C), temperature source Oral, resp. rate 18, height 5\' 6"  (1.676 m), weight 98.2 kg (216 lb 9.6 oz), SpO2 98 %. No results found.  Recent Labs  03/26/16 0557  WBC 2.6*  HGB 9.9*  HCT 32.2*  PLT 186   No results for input(s): NA, K, CL, GLUCOSE, BUN, CREATININE, CALCIUM in the last 72 hours.  Invalid input(s): CO CBG (last 3)  No results for input(s): GLUCAP in the last 72 hours.  Wt Readings from Last 3 Encounters:  03/18/16 98.2 kg (216 lb 9.6 oz)  03/03/16 90.3 kg (199 lb)  01/15/12 87.4 kg (192 lb 9.6 oz)    Physical Exam:  BP 116/82 (BP Location: Left Arm)   Pulse 80   Temp 97.8 F (36.6 C) (Oral)   Resp 18   Ht 5\' 6"  (1.676 m)   Wt 98.2 kg (216 lb 9.6 oz)   SpO2 98%   BMI 34.96 kg/m  Constitutional: She appears well-developed. No distress. Obese.  HENT: Normocephalic and atraumatic.  Eyes: EOM are normal. No discharge.  Cardiovascular: RRR. No JVD. Respiratory: Effort normal and breath sounds normal.  GI: Soft. Bowel sounds are normal.  Midline and RLQ hernias - not examined today Musculoskeletal: left hip tender to palpation and simple movement of leg. Neurological: She is alert and oriented.  Motor: B/l deltoid, bicep, tricep, grip 5/5  RLE: 4/5 hip flexor, knee extensor, ankle dorsal flexor LLE: 3-/5 hip flexion, 3+/5 KE, (unchanged - fluctuates based on timing and "soreness"), ankle dorsiflexion/plantar flexion 4+/5  Skin: Skin is warm and dry. She is not diaphoretic.  Hip incision c/d/i Psychiatric: Normal mood and affect.   Assessment/Plan: 1. Functional deficits secondary to left hip fracture which require 3+ hours per day of interdisciplinary therapy in a  comprehensive inpatient rehab setting. Physiatrist is providing close team supervision and 24 hour management of active medical problems listed below. Physiatrist and rehab team continue to assess barriers to discharge/monitor patient progress toward functional and medical goals.  Function:  Bathing Bathing position   Position: Wheelchair/chair at sink  Bathing parts Body parts bathed by patient: Right arm, Left arm, Chest, Abdomen, Front perineal area, Right upper leg, Buttocks, Left upper leg Body parts bathed by helper: Right lower leg, Left lower leg, Back  Bathing assist Assist Level: Touching or steadying assistance(Pt > 75%) Assistive Device Comment: long-handled sponge Set up : To obtain items  Upper Body Dressing/Undressing Upper body dressing   What is the patient wearing?: Pull over shirt/dress Bra - Perfomed by patient: Thread/unthread right bra strap, Thread/unthread left bra strap Bra - Perfomed by helper: Hook/unhook bra (pull down sports bra) Pull over shirt/dress - Perfomed by patient: Thread/unthread right sleeve, Thread/unthread left sleeve, Put head through opening, Pull shirt over trunk Pull over shirt/dress - Perfomed by helper: Thread/unthread right sleeve, Thread/unthread left sleeve        Upper body assist Assist Level: More than reasonable time   Set up : To obtain clothing/put away  Lower Body Dressing/Undressing Lower body dressing   What is the patient wearing?: Pants, Underwear, Socks, Shoes Underwear - Performed by patient: Thread/unthread right underwear leg, Thread/unthread left underwear leg, Pull underwear up/down Underwear -  Performed by helper: Thread/unthread right underwear leg, Thread/unthread left underwear leg, Pull underwear up/down Pants- Performed by patient: Thread/unthread right pants leg, Thread/unthread left pants leg, Pull pants up/down Pants- Performed by helper: Pull pants up/down Non-skid slipper socks- Performed by patient:  Don/doff right sock, Don/doff left sock Non-skid slipper socks- Performed by helper: Don/doff right sock, Don/doff left sock Socks - Performed by patient: Don/doff right sock, Don/doff left sock Socks - Performed by helper: Don/doff right sock, Don/doff left sock Shoes - Performed by patient: Don/doff right shoe, Don/doff left shoe (doffed shoes with no assist) Shoes - Performed by helper: Don/doff right shoe, Don/doff left shoe       TED Hose - Performed by helper: Don/doff left TED hose, Don/doff right TED hose  Lower body assist Assist for lower body dressing: Touching or steadying assistance (Pt > 75%) Assistive Device Comment: Engineer, maintenance (IT)   Toileting steps completed by patient: Performs perineal hygiene Toileting steps completed by helper: Adjust clothing prior to toileting, Adjust clothing after toileting, Performs perineal hygiene Toileting Assistive Devices: Grab bar or rail, Other (comment) (RW)  Toileting assist Assist level: Supervision or verbal cues   Transfers Chair/bed transfer   Chair/bed transfer method: Stand pivot Chair/bed transfer assist level: Touching or steadying assistance (Pt > 75%) Chair/bed transfer assistive device: Armrests, Environmental manager lift: Architectural technologist activity did not occur: Safety/medical concerns   Max distance: 35 ft Assist level: Touching or steadying assistance (Pt > 75%)   Wheelchair   Type: Manual Max wheelchair distance: 150 ft Assist Level: Supervision or verbal cues  Cognition Comprehension Comprehension assist level: Understands complex 90% of the time/cues 10% of the time  Expression Expression assist level: Expresses complex 90% of the time/cues < 10% of the time  Social Interaction Social Interaction assist level: Interacts appropriately with others with medication or extra time (anti-anxiety, antidepressant).  Problem Solving Problem solving assist level: Solves basic  problems with no assist  Memory Memory assist level: Recognizes or recalls 75 - 89% of the time/requires cueing 10 - 24% of the time    Medical Problem List and Plan: 1.  Gait abnormality, difficulty with transfers, poor activity tolerance secondary to left hip fracture.  D/c to SNF  Will see pt in 1 month for follow up             Xray hip 12/31 reviewed, stable post-surgical 2.  DVT Prophylaxis/Anticoagulation: Mechanical: Sequential compression devices, below knee Bilateral lower extremities Pharmaceutical: Lovenox  Vascular study neg and limited on 12/23 for DVT 3. Pain Management: Premedicate prior to therapy sessions.   Added voltaren gel to left shoulder   Sportscreme to back for chronic back pain.    Scheduled tylenol qid.   Robaxin PRN   ice/heat in addition to oxycodone/robaxin and above  Lidoderm patch added 1/3 to shoulder/neck, hip 1/4 - reaction with erythema  Baclofen 5 TID started 1/3 4. Mood: Team to provide ego support. LCSW to follow for evaluation and support.  5. Neuropsych: This patient is capable of making decisions on his own behalf. 6. Skin/Wound Care: Monitor wound daily for healing  7. Fluids/Electrolytes/Nutrition: Monitor I/O.  8.  ABLA: Added iron supplement.   Hb 9.9 on 1/11  Cont to monitor 9. Abdominal pain/constipation:   KUB reviewed 12/23, unremarkable   Increased miralax to bid.  10. HTN: Monitor bid   Norvasc 5 started 1/3 changed to qHS  Cont HCTZ 12.5  Cont Toprolol 12.5  Will monitor with increased activity  Controlled 1/12  Orthostatics WNL 11. AKI: On HCTZ at home: Improved  BUN/Scr 28/1.22 at admission. Encourage fluid intake.    Cr. 0. 94 on 1/9  Cont encourage fluid intake  IVF bolused on 12/27, ordered qhs IVF started 1/2, d/ced 1/4  Will cont to monitor 12. Thrombocytopenia: Resolved  Likely reactive.  13. Hypoalbuminemia  Supplement initiated 12/23 14. Leukopenia:   WBCs 2.6 on 1/11,   Per Heme/Onc, likely secondary to  hospitalization and medications, Colchicine d/ced, monitor.  Appreciate recs  Will cont to monitor 15. Left neck/shoulder pain with history of breast CA and mastectomy  U/S negative  Voltaren gel ordered  Xray shoulder reviewed, unremarkable  Premedicate before therapies, discussed with therapies as well.  Scheduled Robaxin, increased dose on 12/29, changed back to PRN due to hallucinations/disorientation on 1/2, resolved.  Cont heat  LOS (Days) 21 A FACE TO FACE EVALUATION WAS PERFORMED  Ankit Lorie Phenix 03/27/2016 12:53 PM

## 2016-03-27 NOTE — Progress Notes (Signed)
Social Work  Discharge Note  The overall goal for the admission was met for:   Discharge location: No-[facility]-SNF  Length of Stay: Yes-21 DAYS  Discharge activity level: Yes-MIN ASSIST LEVEL  Home/community participation: Yes  Services provided included: MD, RD, PT, OT, RN, CM, TR, Pharmacy and SW  Financial Services: Medicare and Private Insurance: [employer]  Follow-up services arranged: Other: NHP  Comments (or additional information):FAMILY NEEDS PT TO BE ABLE TO STAY HOME ALONE WHILE THEY WORK DURING THE DAY. HOPE AFTER NH WILL BE AT THIS LEVEL  Patient/Family verbalized understanding of follow-up arrangements: Yes  Individual responsible for coordination of the follow-up plan: LISA-DAUGHTER  Confirmed correct DME delivered: Elease Hashimoto 03/27/2016    Elease Hashimoto

## 2016-03-27 NOTE — Progress Notes (Signed)
Pt discharged to [facility]. Transported by EMS. Report called to receiving RN  At [facility].

## 2016-04-30 ENCOUNTER — Encounter: Payer: Medicare Other | Admitting: Physical Medicine & Rehabilitation

## 2016-05-08 ENCOUNTER — Encounter
Payer: No Typology Code available for payment source | Attending: Physical Medicine & Rehabilitation | Admitting: Physical Medicine & Rehabilitation

## 2016-05-08 ENCOUNTER — Encounter: Payer: Self-pay | Admitting: Physical Medicine & Rehabilitation

## 2016-05-08 VITALS — BP 133/71 | HR 76 | Resp 16

## 2016-05-08 DIAGNOSIS — Z789 Other specified health status: Secondary | ICD-10-CM

## 2016-05-08 DIAGNOSIS — D62 Acute posthemorrhagic anemia: Secondary | ICD-10-CM | POA: Diagnosis not present

## 2016-05-08 DIAGNOSIS — G8918 Other acute postprocedural pain: Secondary | ICD-10-CM | POA: Diagnosis not present

## 2016-05-08 DIAGNOSIS — X58XXXA Exposure to other specified factors, initial encounter: Secondary | ICD-10-CM | POA: Insufficient documentation

## 2016-05-08 DIAGNOSIS — D72819 Decreased white blood cell count, unspecified: Secondary | ICD-10-CM | POA: Diagnosis not present

## 2016-05-08 DIAGNOSIS — R269 Unspecified abnormalities of gait and mobility: Secondary | ICD-10-CM | POA: Diagnosis not present

## 2016-05-08 DIAGNOSIS — I10 Essential (primary) hypertension: Secondary | ICD-10-CM | POA: Insufficient documentation

## 2016-05-08 DIAGNOSIS — Z87891 Personal history of nicotine dependence: Secondary | ICD-10-CM | POA: Insufficient documentation

## 2016-05-08 DIAGNOSIS — S72002S Fracture of unspecified part of neck of left femur, sequela: Secondary | ICD-10-CM

## 2016-05-08 DIAGNOSIS — H3530 Unspecified macular degeneration: Secondary | ICD-10-CM | POA: Diagnosis not present

## 2016-05-08 DIAGNOSIS — S72002A Fracture of unspecified part of neck of left femur, initial encounter for closed fracture: Secondary | ICD-10-CM | POA: Insufficient documentation

## 2016-05-08 DIAGNOSIS — I714 Abdominal aortic aneurysm, without rupture: Secondary | ICD-10-CM | POA: Diagnosis not present

## 2016-05-08 DIAGNOSIS — Z853 Personal history of malignant neoplasm of breast: Secondary | ICD-10-CM | POA: Diagnosis not present

## 2016-05-08 NOTE — Progress Notes (Addendum)
Subjective:    Patient ID: [name], female    DOB: [birth year], 83 y.o.   MRN: [id number]  HPI 83 year old female with history of HTN, breast cancer, AAA, macular degeneration, OA who presents for hospital follow up after receiving CIR for a hip fracture.    Admit date: 03/06/2016 Discharge date: 03/27/2016  Family was unable to care for patient and she was discharged to SNF.  SNF information packed reviewed. Since that time, she states she has been drinking adequate fluids.  She states they are checking her blood work at the SNF.  She is no longer on ASA for prophylaxis. She states she saw Ortho.  Her shoulder has improved.  She stopped the Lidoderm patched due to rash.  Her BP is controlled. She notes stiffness in her left when she initially wakes up.  Overall, she states she is doing well.  She notes she ambulates to restroom independently as well as dressing herself.  Denies falls.  Pain Inventory Average Pain 5 Pain Right Now 5 My pain is stiffness  In the last 24 hours, has pain interfered with the following? General activity 2 Relation with others 2 Enjoyment of life 2 What TIME of day is your pain at its worst? morning Sleep (in general) Good  Pain is worse with: unsure Pain improves with: therapy/exercise and medication Relief from Meds: not sure  Mobility walk with assistance use a walker use a wheelchair transfers alone  Function I need assistance with the following:  meal prep, household duties and shopping  Neuro/Psych No problems in this area  Prior Studies Any changes since last visit?  no  Physicians involved in your care Any changes since last visit?  no   Family History  Problem Relation Age of Onset  . Cancer Sister     ovarian cancer  . Stroke Sister   . Cancer Brother     Lung Cancer   Social History   Social History  . Marital status: Widowed    Spouse name: N/A  . Number of children: N/A  . Years of education: N/A    Social History Main Topics  . Smoking status: Former Smoker    Years: 15.00    Types: Cigarettes    Quit date: 03/16/2004  . Smokeless tobacco: Never Used  . Alcohol use No  . Drug use: No  . Sexual activity: Not Asked   Other Topics Concern  . None   Social History Narrative  . None   Past Surgical History:  Procedure Laterality Date  . ABDOMINAL AORTIC ANEURYSM REPAIR  09/09/2009   Dacron Graft repair  by Dr. Ruta Hinds  . EYE SURGERY     left cataract removal  . FEMUR IM NAIL Left 03/03/2016   Procedure: INTRAMEDULLARY (IM) NAIL FEMORAL;  Surgeon: Renette Butters, MD;  Location: [facility];  Service: Orthopedics;  Laterality: Left;  . HERNIA REPAIR    . JOINT REPLACEMENT  2008   Right Total Hip  . MASTECTOMY  08/22/2001   Left  . ORIF DISTAL RADIUS FRACTURE     Past Medical History:  Diagnosis Date  . Abdominal aortic aneurysm ([place])   . Arthritis   . Cancer ([facility])    Left Breast , Dr. Cherylann Banas  . Colon polyps    Benign  . Complication of anesthesia   . Gout   . H/O hiatal hernia   . Hyperlipidemia   . Hypertension    sees Dr. Edrick Oh, saw  last approx. 1 month ago  . Osteoporosis   . PONV (postoperative nausea and vomiting)   . Psoriasis   . Splenic artery aneurysm (H[facility])    BP 133/71   Pulse 76   Resp 16   SpO2 93%   Opioid Risk Score:   Fall Risk Score:  `1  Depression screen PHQ 2/9  Depression screen PHQ 2/9 05/08/2016  Decreased Interest 0  Down, Depressed, Hopeless 0  PHQ - 2 Score 0    Review of Systems  Constitutional: Negative.   HENT: Negative.   Eyes: Negative.   Respiratory: Negative.   Cardiovascular: Negative.   Gastrointestinal: Negative.   Endocrine: Negative.   Genitourinary: Negative.   Musculoskeletal: Positive for arthralgias and gait problem.  Skin: Negative.   Hematological: Negative.   Psychiatric/Behavioral: Negative.   All other systems reviewed and are negative.     Objective:   Physical Exam Constitutional:  She appears well-developed. No distress. Obese.  HENT: Normocephalic and atraumatic.  Eyes: EOM are normal. No discharge.  Cardiovascular: RRR. No JVD. Respiratory: Effort normal and breath sounds normal.  GI: Soft. Bowel sounds are normal.  Midline and RLQ hernias - not examined today Musculoskeletal: No edema or tenderness. Neurological: She is alert and oriented.  Motor: B/l deltoid, bicep, tricep, grip 5/5  RLE: 4+/5 hip flexor, knee extensor, ankle dorsal flexor LLE: 4-4+/5 hip flexion, 4-4+/5 KE, ankle dorsiflexion/plantar flexion 5/5  Skin: Skin is warm and dry. She is not diaphoretic.  Psychiatric: Normal mood and affect.     Assessment & Plan:  83 year old female with history of HTN, breast cancer, AAA, macular degeneration, OA who presents for hospital follow up after receiving CIR for a hip fracture.    1. Difficulty with transfers, poor activity tolerance secondary to left hip fracture.  Cont therapies  Follow up with Ortho, per Ortho  2.  Gait abnormality  Walker at all times  Cont therapies  3. Pain Management  Improved  Cont tylenol PRN  Cont voltaren gel to left shoulder PRN             Robaxin PRN    Cont ice/heat    4. ABLA:   Follow labs to ensure recovery  Cont iron supplement.   Labs reviewed, no CBC sent from SNF              5. HTN:   Cont meds per PCP  6. Leukopenia  Cont to follow labs  Consider referral to Heme/Onc if continues to trend down (WBCs 2/6 on 1/11)  Labs reviewed, no CBC sent from SNF

## 2016-06-19 ENCOUNTER — Encounter: Payer: TRICARE For Life (TFL) | Admitting: Physical Medicine & Rehabilitation

## 2019-02-12 ENCOUNTER — Inpatient Hospital Stay (HOSPITAL_COMMUNITY)
Admission: AD | Admit: 2019-02-12 | Discharge: 2019-02-14 | DRG: 103 | Disposition: A | Discharge status: Home / Self Care | Payer: Medicare Other | Source: Other Acute Inpatient Hospital | Attending: Internal Medicine | Admitting: Internal Medicine

## 2019-02-12 DIAGNOSIS — M810 Age-related osteoporosis without current pathological fracture: Secondary | ICD-10-CM | POA: Diagnosis present

## 2019-02-12 DIAGNOSIS — G44209 Tension-type headache, unspecified, not intractable: Secondary | ICD-10-CM | POA: Diagnosis not present

## 2019-02-12 DIAGNOSIS — I714 Abdominal aortic aneurysm, without rupture: Secondary | ICD-10-CM | POA: Diagnosis present

## 2019-02-12 DIAGNOSIS — N183 Chronic kidney disease, stage 3 unspecified: Secondary | ICD-10-CM | POA: Diagnosis present

## 2019-02-12 DIAGNOSIS — K8070 Calculus of gallbladder and bile duct without cholecystitis without obstruction: Secondary | ICD-10-CM | POA: Diagnosis present

## 2019-02-12 DIAGNOSIS — K8050 Calculus of bile duct without cholangitis or cholecystitis without obstruction: Secondary | ICD-10-CM | POA: Diagnosis not present

## 2019-02-12 DIAGNOSIS — K449 Diaphragmatic hernia without obstruction or gangrene: Secondary | ICD-10-CM | POA: Diagnosis not present

## 2019-02-12 DIAGNOSIS — I10 Essential (primary) hypertension: Secondary | ICD-10-CM | POA: Diagnosis present

## 2019-02-12 DIAGNOSIS — Z882 Allergy status to sulfonamides status: Secondary | ICD-10-CM

## 2019-02-12 DIAGNOSIS — I716 Thoracoabdominal aortic aneurysm, without rupture, unspecified: Secondary | ICD-10-CM | POA: Diagnosis present

## 2019-02-12 DIAGNOSIS — Z8041 Family history of malignant neoplasm of ovary: Secondary | ICD-10-CM | POA: Diagnosis not present

## 2019-02-12 DIAGNOSIS — Z91040 Latex allergy status: Secondary | ICD-10-CM

## 2019-02-12 DIAGNOSIS — K760 Fatty (change of) liver, not elsewhere classified: Secondary | ICD-10-CM | POA: Diagnosis present

## 2019-02-12 DIAGNOSIS — Z87891 Personal history of nicotine dependence: Secondary | ICD-10-CM | POA: Diagnosis not present

## 2019-02-12 DIAGNOSIS — N1831 Chronic kidney disease, stage 3a: Secondary | ICD-10-CM

## 2019-02-12 DIAGNOSIS — M109 Gout, unspecified: Secondary | ICD-10-CM | POA: Diagnosis present

## 2019-02-12 DIAGNOSIS — Z853 Personal history of malignant neoplasm of breast: Secondary | ICD-10-CM

## 2019-02-12 DIAGNOSIS — D696 Thrombocytopenia, unspecified: Secondary | ICD-10-CM | POA: Diagnosis present

## 2019-02-12 DIAGNOSIS — R519 Headache, unspecified: Secondary | ICD-10-CM | POA: Diagnosis present

## 2019-02-12 DIAGNOSIS — K432 Incisional hernia without obstruction or gangrene: Secondary | ICD-10-CM | POA: Diagnosis present

## 2019-02-12 DIAGNOSIS — E785 Hyperlipidemia, unspecified: Secondary | ICD-10-CM | POA: Diagnosis present

## 2019-02-12 DIAGNOSIS — Z884 Allergy status to anesthetic agent status: Secondary | ICD-10-CM | POA: Diagnosis not present

## 2019-02-12 DIAGNOSIS — Z9012 Acquired absence of left breast and nipple: Secondary | ICD-10-CM

## 2019-02-12 DIAGNOSIS — L409 Psoriasis, unspecified: Secondary | ICD-10-CM | POA: Diagnosis not present

## 2019-02-12 DIAGNOSIS — Z823 Family history of stroke: Secondary | ICD-10-CM

## 2019-02-12 DIAGNOSIS — Z96641 Presence of right artificial hip joint: Secondary | ICD-10-CM | POA: Diagnosis not present

## 2019-02-12 DIAGNOSIS — I129 Hypertensive chronic kidney disease with stage 1 through stage 4 chronic kidney disease, or unspecified chronic kidney disease: Secondary | ICD-10-CM | POA: Diagnosis present

## 2019-02-12 DIAGNOSIS — Z20828 Contact with and (suspected) exposure to other viral communicable diseases: Secondary | ICD-10-CM | POA: Diagnosis present

## 2019-02-12 DIAGNOSIS — Z880 Allergy status to penicillin: Secondary | ICD-10-CM

## 2019-02-12 DIAGNOSIS — Z801 Family history of malignant neoplasm of trachea, bronchus and lung: Secondary | ICD-10-CM

## 2019-02-12 DIAGNOSIS — D649 Anemia, unspecified: Secondary | ICD-10-CM | POA: Diagnosis present

## 2019-02-12 MED ORDER — SODIUM CHLORIDE 0.9 % IV SOLN
INTRAVENOUS | Status: DC
  Administered 2019-02-13 – 2019-02-14 (×4): via INTRAVENOUS

## 2019-02-12 MED ORDER — ACETAMINOPHEN 325 MG PO TABS
650.0000 mg | ORAL_TABLET | Freq: Four times a day (QID) | ORAL | Status: DC | PRN
  Administered 2019-02-14: 650 mg via ORAL
  Filled 2019-02-12: qty 2

## 2019-02-12 MED ORDER — ONDANSETRON HCL 4 MG PO TABS
4.0000 mg | ORAL_TABLET | Freq: Four times a day (QID) | ORAL | Status: DC | PRN
Start: 2019-02-12 — End: 2019-02-15

## 2019-02-12 MED ORDER — METOPROLOL SUCCINATE ER 25 MG PO TB24
25.0000 mg | ORAL_TABLET | Freq: Every day | ORAL | Status: DC
  Administered 2019-02-13 – 2019-02-14 (×2): 25 mg via ORAL
  Filled 2019-02-12 (×2): qty 1

## 2019-02-12 MED ORDER — ENOXAPARIN SODIUM 40 MG/0.4ML ~~LOC~~ SOLN
40.0000 mg | SUBCUTANEOUS | Status: DC
  Administered 2019-02-13 – 2019-02-14 (×2): 40 mg via SUBCUTANEOUS
  Filled 2019-02-12: qty 0.4

## 2019-02-12 MED ORDER — ESCITALOPRAM OXALATE 10 MG PO TABS
10.0000 mg | ORAL_TABLET | Freq: Every day | ORAL | Status: DC
  Administered 2019-02-13 – 2019-02-14 (×2): 10 mg via ORAL
  Filled 2019-02-12 (×2): qty 1

## 2019-02-12 MED ORDER — ONDANSETRON HCL 4 MG/2ML IJ SOLN: 4.0000 mg | Freq: Four times a day (QID) | INTRAMUSCULAR | Status: DC | PRN

## 2019-02-12 MED ORDER — AMLODIPINE BESYLATE 5 MG PO TABS
5.0000 mg | ORAL_TABLET | Freq: Every day | ORAL | Status: DC
  Administered 2019-02-12 – 2019-02-14 (×3): 5 mg via ORAL
  Filled 2019-02-12 (×3): qty 1

## 2019-02-12 MED ORDER — ACETAMINOPHEN 650 MG RE SUPP: 650.0000 mg | Freq: Four times a day (QID) | RECTAL | Status: DC | PRN

## 2019-02-12 MED ORDER — HYDROCHLOROTHIAZIDE 25 MG PO TABS: 25.0000 mg | ORAL_TABLET | Freq: Every day | ORAL | Status: DC

## 2019-02-12 MED ORDER — ROSUVASTATIN CALCIUM 5 MG PO TABS
10.0000 mg | ORAL_TABLET | Freq: Every day | ORAL | Status: DC
  Administered 2019-02-13 – 2019-02-14 (×2): 10 mg via ORAL
  Filled 2019-02-12 (×2): qty 2

## 2019-02-12 MED ORDER — FENOFIBRATE 160 MG PO TABS
160.0000 mg | ORAL_TABLET | Freq: Every day | ORAL | Status: DC
  Administered 2019-02-13 – 2019-02-14 (×2): 160 mg via ORAL
  Filled 2019-02-12 (×2): qty 1

## 2019-02-12 MED ORDER — IRBESARTAN 150 MG PO TABS
150.0000 mg | ORAL_TABLET | Freq: Every day | ORAL | Status: DC
  Administered 2019-02-13 – 2019-02-14 (×2): 150 mg via ORAL
  Filled 2019-02-12 (×2): qty 1

## 2019-02-12 NOTE — H&P (Signed)
History and Physical    [name] [id number] DOB: 01-14-1927 DOA: 02/12/2019  PCP: [name], MD  Patient coming from: Home, [facility] transfer  I have personally briefly reviewed patient's old medical records in [place]  Chief Complaint: Headache  HPI: [name] is a 85 y.o. female with medical history significant of HTN, breast cancer in remission, AAA repaired in 2011, CKD stage 3.  Patient went in to [facility] on 11/27 with c/o 8/10 to 10/10 headache.  Single episode of vomiting at home.  It was suspected that her headache might be a tension headache given that there BP on presentation was 186/104, she was given 10mg  labetalol, 2mg  morphine with improvement in her headache (which is only mild presently on 11/29).  During their work up however, they obtained a CXR which was questionable for mass.  CT chest was obtained which ruled out mass (instead they think its just overlapping vascular structures that they saw on CXR), but CT chest was positive for cholelithiasis and apparent choledocholithiasis with mild gallbladder wall thickening.  Her LFTs, bili, were NL.  She has no abd pain, no nausea, only the one episode of vomiting with headache.  Transfer was requested for MRCP which their surgeon recommended however no beds were available until this evening.  She ate dinner earlier this evening in the ED.  She is asymptomatic other than mild headache at this time.    Review of Systems: As per HPI, otherwise all review of systems negative.  Past Medical History:  Diagnosis Date  . Abdominal aortic aneurysm ([place])   . Arthritis   . Cancer (HCC)    Left Breast , Dr. Cherylann Banas  . Colon polyps    Benign  . Complication of anesthesia   . Gout   . H/O hiatal hernia   . Hyperlipidemia   . Hypertension    sees Dr. Edrick Oh, saw last approx. 1 month ago  . Osteoporosis   . PONV (postoperative nausea and vomiting)   . Psoriasis   . Splenic artery  aneurysm [facility])     Past Surgical History:  Procedure Laterality Date  . ABDOMINAL AORTIC ANEURYSM REPAIR  09/09/2009   Dacron Graft repair  by Dr. Ruta Hinds  . EYE SURGERY     left cataract removal  . FEMUR IM NAIL Left 03/03/2016   Procedure: INTRAMEDULLARY (IM) NAIL FEMORAL;  Surgeon: Renette Butters, MD;  Location: [place];  Service: Orthopedics;  Laterality: Left;  . HERNIA REPAIR    . JOINT REPLACEMENT  2008   Right Total Hip  . MASTECTOMY  08/22/2001   Left  . ORIF DISTAL RADIUS FRACTURE       reports that she quit smoking about 14 years ago. Her smoking use included cigarettes. She quit after 15.00 years of use. She has never used smokeless tobacco. She reports that she does not drink alcohol or use drugs.  Allergies  Allergen Reactions  . Prednisone Swelling  . Sulfa Antibiotics Swelling  . Latex   . Lidoderm [Lidocaine]     Caused rash--question allergy to adhesive    Family History  Problem Relation Age of Onset  . Cancer Sister        ovarian cancer  . Stroke Sister   . Cancer Brother        Lung Cancer     Prior to Admission medications   Medication Sig Start Date End Date Taking? Authorizing Provider  acetaminophen (TYLENOL) 325 MG tablet Take 2  tablets (650 mg total) by mouth 4 (four) times daily -  with meals and at bedtime. 03/24/16   Love, Ivan Anchors, PA-C  Amino Acids-Protein Hydrolys (FEEDING SUPPLEMENT, PRO-STAT SUGAR FREE 64,) LIQD Take 30 mLs by mouth 2 (two) times daily. 03/24/16   Love, Ivan Anchors, PA-C  amLODipine (NORVASC) 5 MG tablet Take 1 tablet (5 mg total) by mouth at bedtime. 03/27/16   Love, Ivan Anchors, PA-C  baclofen (LIORESAL) 10 MG tablet Take 0.5 tablets (5 mg total) by mouth 3 (three) times daily. 03/24/16   Love, Ivan Anchors, PA-C  camphor-menthol [facility]) lotion Apply topically as needed for itching. 03/24/16   Love, Ivan Anchors, PA-C  diphenhydrAMINE (BENADRYL) 12.5 MG/5ML elixir Take 5-10 mLs (12.5-25 mg total) by mouth every 6 (six) hours as  needed for itching. 03/27/16   Love, Ivan Anchors, PA-C  escitalopram (LEXAPRO) 10 MG tablet Take 10 mg by mouth daily.      [provider]  feeding supplement, ENSURE ENLIVE, (ENSURE ENLIVE) LIQD Take 237 mLs by mouth daily at 3 pm. 03/24/16   Love, Ivan Anchors, PA-C  fenofibrate (TRICOR) 145 MG tablet Take 145 mg by mouth daily.    [provider]  hydrochlorothiazide (MICROZIDE) 12.5 MG capsule Take 1 capsule (12.5 mg total) by mouth daily. 03/25/16   Love, Ivan Anchors, PA-C  lidocaine (LIDODERM) 5 % Place 1 patch onto the skin daily. Remove & Discard patch within 12 hours or as directed by MD 03/25/16   Love, Ivan Anchors, PA-C  Menthol-Methyl Salicylate (MUSCLE RUB) 10-15 % CREA Apply 1 application topically 3 (three) times daily before meals. To back muscles 03/24/16   Love, Ivan Anchors, PA-C  methocarbamol (ROBAXIN) 500 MG tablet Take 500 mg by mouth every 8 (eight) hours as needed for muscle spasms.    [provider]  metoprolol succinate (TOPROL-XL) 25 MG 24 hr tablet Take 0.5 tablets (12.5 mg total) by mouth daily. 03/27/16   Love, Ivan Anchors, PA-C  polyethylene glycol (MIRALAX / GLYCOLAX) packet Take 17 g by mouth daily. 03/24/16   Love, Ivan Anchors, PA-C  rosuvastatin (CRESTOR) 10 MG tablet Take 10 mg by mouth daily.      [provider]    Physical Exam: Vitals:   02/12/19 2301  BP: (!) 156/72  Pulse: (!) 55  Resp: 17  Temp: 97.6 F (36.4 C)  TempSrc: Oral  SpO2: 100%    Constitutional: NAD, calm, comfortable Eyes: PERRL, lids and conjunctivae normal ENMT: Mucous membranes are moist. Posterior pharynx clear of any exudate or lesions.Normal dentition.  Neck: normal, supple, no masses, no thyromegaly Respiratory: clear to auscultation bilaterally, no wheezing, no crackles. Normal respiratory effort. No accessory muscle use.  Cardiovascular: Regular rate and rhythm, no murmurs / rubs / gallops. No extremity edema. 2+ pedal pulses. No carotid bruits.  Abdomen: no  tenderness, no masses palpated. No hepatosplenomegaly. Bowel sounds positive.  Musculoskeletal: no clubbing / cyanosis. No joint deformity upper and lower extremities. Good ROM, no contractures. Normal muscle tone.  Skin: no rashes, lesions, ulcers. No induration Neurologic: CN 2-12 grossly intact. Sensation intact, DTR normal. Strength 5/5 in all 4.  Psychiatric: Normal judgment and insight. Alert and oriented x 3. Normal mood.    Labs on Admission: I have personally reviewed following labs and imaging studies  CBC: No results for input(s): WBC, NEUTROABS, HGB, HCT, MCV, PLT in the last 168 hours. Basic Metabolic Panel: No results for input(s): NA, K, CL, CO2, GLUCOSE, BUN, CREATININE, CALCIUM,  MG, PHOS in the last 168 hours. GFR: CrCl cannot be calculated (Patient's most recent lab result is older than the maximum 21 days allowed.). Liver Function Tests: No results for input(s): AST, ALT, ALKPHOS, BILITOT, PROT, ALBUMIN in the last 168 hours. No results for input(s): LIPASE, AMYLASE in the last 168 hours. No results for input(s): AMMONIA in the last 168 hours. Coagulation Profile: No results for input(s): INR, PROTIME in the last 168 hours. Cardiac Enzymes: No results for input(s): CKTOTAL, CKMB, CKMBINDEX, TROPONINI in the last 168 hours. BNP (last 3 results) No results for input(s): PROBNP in the last 8760 hours. HbA1C: No results for input(s): HGBA1C in the last 72 hours. CBG: No results for input(s): GLUCAP in the last 168 hours. Lipid Profile: No results for input(s): CHOL, HDL, LDLCALC, TRIG, CHOLHDL, LDLDIRECT in the last 72 hours. Thyroid Function Tests: No results for input(s): TSH, T4TOTAL, FREET4, T3FREE, THYROIDAB in the last 72 hours. Anemia Panel: No results for input(s): VITAMINB12, FOLATE, FERRITIN, TIBC, IRON, RETICCTPCT in the last 72 hours. Urine analysis:    Component Value Date/Time   COLORURINE YELLOW 03/20/2011 2205   APPEARANCEUR CLEAR 03/20/2011 2205    LABSPEC <1.005 (L) 03/20/2011 2205   PHURINE 7.0 03/20/2011 2205   GLUCOSEU NEGATIVE 03/20/2011 2205   HGBUR NEGATIVE 03/20/2011 2205   BILIRUBINUR NEGATIVE 03/20/2011 2205   KETONESUR NEGATIVE 03/20/2011 2205   PROTEINUR 30 (A) 03/20/2011 2205   UROBILINOGEN 0.2 03/20/2011 2205   NITRITE NEGATIVE 03/20/2011 2205   LEUKOCYTESUR NEGATIVE 03/20/2011 2205    Radiological Exams on Admission: No results found.  EKG: Independently reviewed.  Assessment/Plan Principal Problem:   Choledocholithiasis Active Problems:   Essential hypertension, benign   CKD (chronic kidney disease) stage 3, GFR 30-59 ml/min   Thoracoabdominal aortic aneurysm (TAAA) ([place])    1. Choledocholithiasis - unclear clinical significance 1. Check CBC and CMP 2. Obtain MRCP 3. Consult GI after above studies 4. Will make patient NPO after MN for the study 1. NS at 100 cc/hr 2. Hold home HCTZ 2. HTN - 1. Cont home BP meds except HCTZ 3. CKD stage 3 - chronic and stable 4. TAAA - 1. 4.7cm 2. Needs CVTS referral for f/u  DVT prophylaxis: Lovenox (1400 tomorrow) Code Status: Full Family Communication: No family in room Disposition Plan: Home after admit Consults called: None Admission status: Place in 80    Junah Yam, [place] [facility]  How to contact the [facility] Attending or Consulting provider [place] or covering provider during after hours [place], for this patient?  1. Check the care team in [facility] and look for a) attending/consulting [facility] provider listed and b) the [facility] team listed 2. Log into [email or web address]  Amion Physician Scheduling and messaging for groups and whole hospitals  On call and physician scheduling software for group practices, residents, hospitalists and other medical providers for call, clinic, rotation and shift schedules. OnCall Enterprise is a hospital-wide system for scheduling doctors and paging doctors on call. EasyPlot is for scientific plotting and data analysis.   [email or web address]  and use [place] universal password to access. If you do not have the password, please contact the hospital operator.  3. Locate the [facility] provider you are looking for under [facility] and page to a number that you can be directly reached. 4. If you still have difficulty reaching the provider, please page the [facility] (Director on Call) for the Hospitalists listed on amion for assistance.  02/12/2019, 11:45 PM

## 2019-02-13 ENCOUNTER — Inpatient Hospital Stay (HOSPITAL_COMMUNITY): Payer: Medicare Other

## 2019-02-13 DIAGNOSIS — Z882 Allergy status to sulfonamides status: Secondary | ICD-10-CM | POA: Diagnosis not present

## 2019-02-13 DIAGNOSIS — M810 Age-related osteoporosis without current pathological fracture: Secondary | ICD-10-CM | POA: Diagnosis present

## 2019-02-13 DIAGNOSIS — K8050 Calculus of bile duct without cholangitis or cholecystitis without obstruction: Secondary | ICD-10-CM | POA: Diagnosis not present

## 2019-02-13 DIAGNOSIS — Z91040 Latex allergy status: Secondary | ICD-10-CM | POA: Diagnosis not present

## 2019-02-13 DIAGNOSIS — Z8041 Family history of malignant neoplasm of ovary: Secondary | ICD-10-CM | POA: Diagnosis not present

## 2019-02-13 DIAGNOSIS — M109 Gout, unspecified: Secondary | ICD-10-CM | POA: Diagnosis present

## 2019-02-13 DIAGNOSIS — N183 Chronic kidney disease, stage 3 unspecified: Secondary | ICD-10-CM | POA: Diagnosis present

## 2019-02-13 DIAGNOSIS — Z801 Family history of malignant neoplasm of trachea, bronchus and lung: Secondary | ICD-10-CM | POA: Diagnosis not present

## 2019-02-13 DIAGNOSIS — I716 Thoracoabdominal aortic aneurysm, without rupture: Secondary | ICD-10-CM | POA: Diagnosis present

## 2019-02-13 DIAGNOSIS — Z823 Family history of stroke: Secondary | ICD-10-CM | POA: Diagnosis not present

## 2019-02-13 DIAGNOSIS — Z96641 Presence of right artificial hip joint: Secondary | ICD-10-CM | POA: Diagnosis present

## 2019-02-13 DIAGNOSIS — Z20828 Contact with and (suspected) exposure to other viral communicable diseases: Secondary | ICD-10-CM | POA: Diagnosis present

## 2019-02-13 DIAGNOSIS — R519 Headache, unspecified: Secondary | ICD-10-CM | POA: Diagnosis present

## 2019-02-13 DIAGNOSIS — K449 Diaphragmatic hernia without obstruction or gangrene: Secondary | ICD-10-CM | POA: Diagnosis present

## 2019-02-13 DIAGNOSIS — K8070 Calculus of gallbladder and bile duct without cholecystitis without obstruction: Secondary | ICD-10-CM | POA: Diagnosis present

## 2019-02-13 DIAGNOSIS — G44209 Tension-type headache, unspecified, not intractable: Secondary | ICD-10-CM | POA: Diagnosis present

## 2019-02-13 DIAGNOSIS — L409 Psoriasis, unspecified: Secondary | ICD-10-CM | POA: Diagnosis present

## 2019-02-13 DIAGNOSIS — Z87891 Personal history of nicotine dependence: Secondary | ICD-10-CM | POA: Diagnosis not present

## 2019-02-13 DIAGNOSIS — I129 Hypertensive chronic kidney disease with stage 1 through stage 4 chronic kidney disease, or unspecified chronic kidney disease: Secondary | ICD-10-CM | POA: Diagnosis present

## 2019-02-13 DIAGNOSIS — I714 Abdominal aortic aneurysm, without rupture: Secondary | ICD-10-CM | POA: Diagnosis present

## 2019-02-13 DIAGNOSIS — N1831 Chronic kidney disease, stage 3a: Secondary | ICD-10-CM | POA: Diagnosis not present

## 2019-02-13 DIAGNOSIS — I10 Essential (primary) hypertension: Secondary | ICD-10-CM | POA: Diagnosis not present

## 2019-02-13 DIAGNOSIS — K432 Incisional hernia without obstruction or gangrene: Secondary | ICD-10-CM | POA: Diagnosis present

## 2019-02-13 DIAGNOSIS — E785 Hyperlipidemia, unspecified: Secondary | ICD-10-CM | POA: Diagnosis present

## 2019-02-13 DIAGNOSIS — Z9012 Acquired absence of left breast and nipple: Secondary | ICD-10-CM | POA: Diagnosis not present

## 2019-02-13 DIAGNOSIS — Z880 Allergy status to penicillin: Secondary | ICD-10-CM | POA: Diagnosis not present

## 2019-02-13 DIAGNOSIS — Z884 Allergy status to anesthetic agent status: Secondary | ICD-10-CM | POA: Diagnosis not present

## 2019-02-13 DIAGNOSIS — Z853 Personal history of malignant neoplasm of breast: Secondary | ICD-10-CM | POA: Diagnosis not present

## 2019-02-13 LAB — CBC
HCT: 31.6 % — ABNORMAL LOW (ref 36.0–46.0)
Hemoglobin: 10.1 g/dL — ABNORMAL LOW (ref 12.0–15.0)
MCH: 28.5 pg (ref 26.0–34.0)
MCHC: 32 g/dL (ref 30.0–36.0)
MCV: 89 fL (ref 80.0–100.0)
Platelets: 130 10*3/uL — ABNORMAL LOW (ref 150–400)
RBC: 3.55 MIL/uL — ABNORMAL LOW (ref 3.87–5.11)
RDW: 13.9 % (ref 11.5–15.5)
WBC: 3.6 10*3/uL — ABNORMAL LOW (ref 4.0–10.5)
nRBC: 0 % (ref 0.0–0.2)

## 2019-02-13 LAB — COMPREHENSIVE METABOLIC PANEL
ALT: 9 U/L (ref 0–44)
AST: 16 U/L (ref 15–41)
Albumin: 2.5 g/dL — ABNORMAL LOW (ref 3.5–5.0)
Alkaline Phosphatase: 68 U/L (ref 38–126)
Anion gap: 6 (ref 5–15)
BUN: 16 mg/dL (ref 8–23)
CO2: 23 mmol/L (ref 22–32)
Calcium: 9 mg/dL (ref 8.9–10.3)
Chloride: 112 mmol/L — ABNORMAL HIGH (ref 98–111)
Creatinine, Ser: 0.86 mg/dL (ref 0.44–1.00)
GFR calc Af Amer: >60 mL/min (ref >60)
GFR calc non Af Amer: 59 mL/min — ABNORMAL LOW (ref >60)
Glucose, Bld: 97 mg/dL (ref 70–99)
Potassium: 3.6 mmol/L (ref 3.5–5.1)
Sodium: 141 mmol/L (ref 135–145)
Total Bilirubin: 0.5 mg/dL (ref 0.3–1.2)
Total Protein: 5.9 g/dL — ABNORMAL LOW (ref 6.5–8.1)

## 2019-02-13 LAB — SARS CORONAVIRUS 2 (TAT 6-24 HRS): SARS Coronavirus 2: NEGATIVE

## 2019-02-13 NOTE — Progress Notes (Signed)
PROGRESS NOTE    [name]  [id number] DOB: Aug 16, 1926 DOA: 02/12/2019 PCP: [name], MD    Brief Narrative: [name] is a 85 y.o. female with medical history significant of HTN, breast cancer in remission, AAA repaired in 2011, CKD stage 3. Patient went in to [facility] on 11/27 with c/o 8/10 to 10/10 headache.  Single episode of vomiting at home. It was suspected that her headache might be a tension headache given that there BP on presentation was 186/104, she was given 10mg  labetalol, 2mg  morphine with improvement in her headache (which is only mild presently on 11/29). During their work up however, they obtained a CXR which was questionable for mass.  CT chest was obtained which ruled out mass (instead they think its just overlapping vascular structures that they saw on CXR), but CT chest was positive for cholelithiasis and apparent choledocholithiasis with mild gallbladder wall thickening. Her LFTs, bili, were NL.  She has no abd pain, no nausea, only the one episode of vomiting with headache.  Transfer was requested for MRCP which their surgeon recommended,  however no beds were available until this evening. She was admitted for MRCP and possible GI evaluation afterwards.  Assessment & Plan:   Principal Problem:   Choledocholithiasis Active Problems:   Essential hypertension, benign   CKD (chronic kidney disease) stage 3, GFR 30-59 ml/min   Thoracoabdominal aortic aneurysm (TAAA) ([place])   1. Choledocholithiasis - unclear clinical significance - Labs unremarkable. She reports mild abdominal pain.   - MRCP showed   7 mm distal CBD stone, corresponding to the CT finding. No intrahepatic or extrahepatic duct dilatation. Common duct measures 24mm. - GI consulted, follow up recommendation. - Clear liquid diet, will advance if she tolerates.  2. HTN - 1. Cont home BP meds except HCTZ.  3. CKD stage 3 - chronic and stable.  4. TAAA  - 1. 4.7cm 2. Needs CVTS referral for f/u.   DVT prophylaxis: Lovenox Code Status: Full Family Communication: D/w patient Disposition Plan:   Discharge Home if cleared from GI and no intervention.  Consultants:   Gastroenterology   Procedures:   Antimicrobials:  Anti-infectives (From admission, onward)   None      Subjective: Patient was seen and examined at bedside, she denies nausea and vomiting, reports her abdominal pain is getting better as well.  Objective: Vitals:   02/12/19 2301 02/13/19 0529 02/13/19 1454  BP: (!) 156/72 (!) 156/80 (!) 157/78  Pulse: (!) 55 72 70  Resp: 17 15 20   Temp: 97.6 F (36.4 C) 97.9 F (36.6 C) 98.5 F (36.9 C)  TempSrc: Oral Oral Oral  SpO2: 100% 98% 100%    Intake/Output Summary (Last 24 hours) at 02/13/2019 1622 Last data filed at 02/13/2019 1311 Gross per 24 hour  Intake 298.34 ml  Output 150 ml  Net 148.34 ml   There were no vitals filed for this visit.  Examination:  General exam: Appears calm and comfortable  Respiratory system: Clear to auscultation. Respiratory effort normal. Cardiovascular system: S1 & S2 heard, RRR. No JVD, murmurs, rubs, gallops or clicks. No pedal edema. Gastrointestinal system: Abdomen is nondistended, soft and  Mildly tender. No organomegaly or masses felt. Normal bowel sounds heard. Central nervous system: Alert and oriented. No focal neurological deficits. Extremities:  No pedal edema, no calf tenderness. Skin: No rashes, lesions or ulcers Psychiatry: Judgement and insight appear normal. Mood & affect appropriate.     Data Reviewed: I have personally reviewed  following labs and imaging studies  CBC: Recent Labs  Lab 02/13/19 0232  WBC 3.6*  HGB 10.1*  HCT 31.6*  MCV 89.0  PLT [id number]   Basic Metabolic Panel: Recent Labs  Lab 02/13/19 0232  NA 141  K 3.6  CL 112*  CO2 23  GLUCOSE 97  BUN 16  CREATININE 0.86  CALCIUM 9.0   GFR: CrCl cannot be calculated (Unknown ideal  weight.). Liver Function Tests: Recent Labs  Lab 02/13/19 0232  AST 16  ALT 9  ALKPHOS 68  BILITOT 0.5  PROT 5.9*  ALBUMIN 2.5*   No results for input(s): LIPASE, AMYLASE in the last 168 hours. No results for input(s): AMMONIA in the last 168 hours. Coagulation Profile: No results for input(s): INR, PROTIME in the last 168 hours. Cardiac Enzymes: No results for input(s): CKTOTAL, CKMB, CKMBINDEX, TROPONINI in the last 168 hours. BNP (last 3 results) No results for input(s): PROBNP in the last 8760 hours. HbA1C: No results for input(s): HGBA1C in the last 72 hours. CBG: No results for input(s): GLUCAP in the last 168 hours. Lipid Profile: No results for input(s): CHOL, HDL, LDLCALC, TRIG, CHOLHDL, LDLDIRECT in the last 72 hours. Thyroid Function Tests: No results for input(s): TSH, T4TOTAL, FREET4, T3FREE, THYROIDAB in the last 72 hours. Anemia Panel: No results for input(s): VITAMINB12, FOLATE, FERRITIN, TIBC, IRON, RETICCTPCT in the last 72 hours. Sepsis Labs: No results for input(s): PROCALCITON, LATICACIDVEN in the last 168 hours.  Recent Results (from the past 240 hour(s))  SARS CORONAVIRUS 2 (TAT 6-24 HRS) Nasopharyngeal Nasopharyngeal Swab     Status: None   Collection Time: 02/13/19 12:17 AM   Specimen: Nasopharyngeal Swab  Result Value Ref Range Status   SARS Coronavirus 2 NEGATIVE NEGATIVE Final    Comment: (NOTE) SARS-CoV-2 target nucleic acids are NOT DETECTED. The SARS-CoV-2 RNA is generally detectable in upper and lower respiratory specimens during the acute phase of infection. Negative results do not preclude SARS-CoV-2 infection, do not rule out co-infections with other pathogens, and should not be used as the sole basis for treatment or other patient management decisions. Negative results must be combined with clinical observations, patient history, and epidemiological information. The expected result is Negative. Fact Sheet for  Patients: SugarRoll.be Fact Sheet for Healthcare Providers: https://www.woods-mathews.com/ This test is not yet approved or cleared by the [place] FDA and  has been authorized for detection and/or diagnosis of SARS-CoV-2 by FDA under an Emergency Use Authorization (EUA). This EUA will remain  in effect (meaning this test can be used) for the duration of the COVID-19 declaration under Section 56 4(b)(1) of the Act, 21 U.S.C. section 360bbb-3(b)(1), unless the authorization is terminated or revoked sooner. Performed at [facility], [place] [address], [place], [place] [address]     Radiology Studies: Mr Abdomen Mrcp Wo Contrast  Result Date: 02/13/2019 CLINICAL DATA:  Incidental choledocholithiasis, normal LFTs EXAM: MRI ABDOMEN WITHOUT CONTRAST  (INCLUDING MRCP) TECHNIQUE: Multiplanar multisequence MR imaging of the abdomen was performed. Heavily T2-weighted images of the biliary and pancreatic ducts were obtained, and three-dimensional MRCP images were rendered by post processing. COMPARISON:  Partial comparison to [facility] CT chest dated 02/10/2019 FINDINGS: Lower chest: Trace bilateral pleural effusions, right greater than left. Hepatobiliary: Mild hepatic steatosis.  No focal hepatic lesion. Layering subcentimeter gallstones (series 5/image 24). No intrahepatic or extrahepatic ductal dilatation. Mid/distal common duct measures 6 mm. 7 mm distal CBD stone (series 6/image 35). Pancreas:  Within normal limits. Spleen:  Within normal limits. Adrenals/Urinary Tract:  Adrenal glands are within normal limits. Bilateral renal cysts, measuring up to 14 mm in the anterior right lower kidney (series 5/image 31), simple. Suspected postprocedural changes versus involuting hemorrhagic cyst in the lateral left lower kidney (series 5/image 30). No hydronephrosis. Stomach/Bowel: Stomach and visualized bowel are unremarkable. Vascular/Lymphatic:  No evidence of  abdominal aortic aneurysm. No suspicious abdominal lymphadenopathy. Other:  No abdominal ascites. Musculoskeletal: Mild degenerative changes of the lumbar spine. IMPRESSION: 7 mm distal CBD stone, corresponding to the CT finding. No intrahepatic or extrahepatic duct dilatation. Common duct measures 6 mm. Layering gallstones, without associated inflammatory changes to suggest acute cholecystitis. Mild hepatic steatosis. Electronically Signed   By: Julian Hy M.D.   On: 02/13/2019 14:28   Mr 3d Recon At Scanner  Result Date: 02/13/2019 CLINICAL DATA:  Incidental choledocholithiasis, normal LFTs EXAM: MRI ABDOMEN WITHOUT CONTRAST  (INCLUDING MRCP) TECHNIQUE: Multiplanar multisequence MR imaging of the abdomen was performed. Heavily T2-weighted images of the biliary and pancreatic ducts were obtained, and three-dimensional MRCP images were rendered by post processing. COMPARISON:  Partial comparison to [facility] CT chest dated 02/10/2019 FINDINGS: Lower chest: Trace bilateral pleural effusions, right greater than left. Hepatobiliary: Mild hepatic steatosis.  No focal hepatic lesion. Layering subcentimeter gallstones (series 5/image 24). No intrahepatic or extrahepatic ductal dilatation. Mid/distal common duct measures 6 mm. 7 mm distal CBD stone (series 6/image 35). Pancreas:  Within normal limits. Spleen:  Within normal limits. Adrenals/Urinary Tract:  Adrenal glands are within normal limits. Bilateral renal cysts, measuring up to 14 mm in the anterior right lower kidney (series 5/image 31), simple. Suspected postprocedural changes versus involuting hemorrhagic cyst in the lateral left lower kidney (series 5/image 30). No hydronephrosis. Stomach/Bowel: Stomach and visualized bowel are unremarkable. Vascular/Lymphatic:  No evidence of abdominal aortic aneurysm. No suspicious abdominal lymphadenopathy. Other:  No abdominal ascites. Musculoskeletal: Mild degenerative changes of the lumbar spine. IMPRESSION: 7  mm distal CBD stone, corresponding to the CT finding. No intrahepatic or extrahepatic duct dilatation. Common duct measures 6 mm. Layering gallstones, without associated inflammatory changes to suggest acute cholecystitis. Mild hepatic steatosis. Electronically Signed   By: Julian Hy M.D.   On: 02/13/2019 14:28    Scheduled Meds:  amLODipine  5 mg Oral QHS   enoxaparin (LOVENOX) injection  40 mg Subcutaneous Q24H   escitalopram  10 mg Oral Daily   fenofibrate  160 mg Oral Daily   irbesartan  150 mg Oral Daily   metoprolol succinate  25 mg Oral Daily   rosuvastatin  10 mg Oral Daily   Continuous Infusions:  sodium chloride 100 mL/hr at 02/13/19 1430     LOS: 1 day    Time spent: [place], MD [facility]   If 7PM-7AM, please contact night-coverage

## 2019-02-13 NOTE — Consult Note (Signed)
[facility] Consult: 4:13 PM 02/13/2019  LOS: 1 day    Referring Provider: Dr Dwyane Dee  Primary Care Physician:  [name], MD in [place]. Primary Gastroenterologist:  unassigned    Reason for Consultation: Choledocholithiasis.   HPI: [name] is a 85 y.o. female.  Her past medical history as outlined below. Transfer from [facility] yesterday.  Presented there 11/27 with headache, vomiting in setting of hypertension.  During the work-up chest x-ray was questionable for mass and CT chest was obtained.   The "mass" was overlapping vascular structures but CT incidentally showed cholelithiasis, possible choledocholithiasis and mild GB wall thickening.  However LFTs were normal.  She had not complained of abdominal pain, or nausea but she had had one episode of vomiting associated with a headache.  She was transferred to [facility] for further evaluation. MRCP afternoon shows 42mm stone in distal CBD.  CBD measuring 6 mm.  There is mild hepatic steatosis and additional stones in an otherwise unremarkable gallbladder. LFTs repeated today and other than low albumin, are absolutely normal.  Speaking with her today, she denies anorexia, nausea, vomiting, heartburn, abdominal pain, jaundice, dark-colored urine or pale stools, dysphagia.  Miralx on ouotpt med list but no PPI or H2 blocker.  Eats 3 meals a day at her assisted living facility.  Past Medical History:  Diagnosis Date  . Abdominal aortic aneurysm ([place])   . Arthritis   . Cancer (HCC)    Left Breast , Dr. Cherylann Banas  . Colon polyps    Benign  . Complication of anesthesia   . Gout   . H/O hiatal hernia   . Hyperlipidemia   . Hypertension    sees Dr. Edrick Oh, saw last approx. 1 month ago  . Osteoporosis   . PONV  (postoperative nausea and vomiting)   . Psoriasis   . Splenic artery aneurysm [facility])     Past Surgical History:  Procedure Laterality Date  . ABDOMINAL AORTIC ANEURYSM REPAIR  09/09/2009   Dacron Graft repair  by Dr. Ruta Hinds  . EYE SURGERY     left cataract removal  . FEMUR IM NAIL Left 03/03/2016   Procedure: INTRAMEDULLARY (IM) NAIL FEMORAL;  Surgeon: Renette Butters, MD;  Location: [facility];  Service: Orthopedics;  Laterality: Left;  . HERNIA REPAIR    . JOINT REPLACEMENT  2008   Right Total Hip  . MASTECTOMY  08/22/2001   Left  . ORIF DISTAL RADIUS FRACTURE      Prior to Admission medications   Medication Sig Start Date End Date Taking? Authorizing Provider  acetaminophen (TYLENOL) 325 MG tablet Take 650 mg by mouth every 6 (six) hours as needed for mild pain or headache.   Yes [provider]  amLODipine (NORVASC) 5 MG tablet Take 1 tablet (5 mg total) by mouth at bedtime. 03/27/16  Yes Love, Ivan Anchors, PA-C  baclofen (LIORESAL) 10 MG tablet Take 0.5 tablets (5 mg total) by mouth 3 (three) times daily. Patient taking differently: Take 5 mg by mouth 3 (three) times daily.  Taking twice daily as needed 03/24/16  Yes Love, Ivan Anchors, PA-C  diphenhydrAMINE (BENADRYL) 25 mg capsule Take 25 mg by mouth every 6 (six) hours as needed for itching.   Yes [provider]  docusate sodium (COLACE) 100 MG capsule Take 100 mg by mouth daily as needed for mild constipation.   Yes [provider]  escitalopram (LEXAPRO) 10 MG tablet Take 10 mg by mouth daily.     Yes [provider]  fenofibrate (TRICOR) 145 MG tablet Take 145 mg by mouth daily.   Yes [provider]  hydrochlorothiazide (HYDRODIURIL) 25 MG tablet Take 25 mg by mouth daily. 06/13/18  Yes [provider]  Menthol-Methyl Salicylate (MUSCLE RUB) 10-15 % CREA Apply 1 application topically 3 (three) times daily before meals. To back muscles Patient taking differently: Apply 1  application topically as needed for muscle pain. To back muscles 03/24/16  Yes Love, Ivan Anchors, PA-C  methocarbamol (ROBAXIN) 500 MG tablet Take 500 mg by mouth every 8 (eight) hours as needed for muscle spasms.   Yes [provider]  metoprolol succinate (TOPROL-XL) 25 MG 24 hr tablet Take 0.5 tablets (12.5 mg total) by mouth daily. 03/27/16  Yes Love, Ivan Anchors, PA-C  rosuvastatin (CRESTOR) 10 MG tablet Take 10 mg by mouth daily.     Yes [provider]  acetaminophen (TYLENOL) 325 MG tablet Take 2 tablets (650 mg total) by mouth 4 (four) times daily -  with meals and at bedtime. Patient not taking: Reported on 02/13/2019 03/24/16   Love, Ivan Anchors, PA-C  Amino Acids-Protein Hydrolys (FEEDING SUPPLEMENT, PRO-STAT SUGAR FREE 64,) LIQD Take 30 mLs by mouth 2 (two) times daily. Patient not taking: Reported on 02/13/2019 03/24/16   Love, Ivan Anchors, PA-C  camphor-menthol [facility]) lotion Apply topically as needed for itching. Patient not taking: Reported on 02/13/2019 03/24/16   Love, Ivan Anchors, PA-C  diphenhydrAMINE (BENADRYL) 12.5 MG/5ML elixir Take 5-10 mLs (12.5-25 mg total) by mouth every 6 (six) hours as needed for itching. Patient not taking: Reported on 02/13/2019 03/27/16   Love, Ivan Anchors, PA-C  feeding supplement, ENSURE ENLIVE, (ENSURE ENLIVE) LIQD Take 237 mLs by mouth daily at 3 pm. Patient not taking: Reported on 02/13/2019 03/24/16   Love, Ivan Anchors, PA-C  lidocaine (LIDODERM) 5 % Place 1 patch onto the skin daily. Remove & Discard patch within 12 hours or as directed by MD Patient not taking: Reported on 02/13/2019 03/25/16   Love, Ivan Anchors, PA-C  polyethylene glycol (MIRALAX / GLYCOLAX) packet Take 17 g by mouth daily. Patient not taking: Reported on 02/13/2019 03/24/16   Bary Leriche, PA-C    Scheduled Meds: . amLODipine  5 mg Oral QHS  . enoxaparin (LOVENOX) injection  40 mg Subcutaneous Q24H  . escitalopram  10 mg Oral Daily  . fenofibrate  160 mg Oral Daily  . irbesartan  150  mg Oral Daily  . metoprolol succinate  25 mg Oral Daily  . rosuvastatin  10 mg Oral Daily   Infusions: . sodium chloride 100 mL/hr at 02/13/19 1430   PRN Meds: acetaminophen **OR** acetaminophen, ondansetron **OR** ondansetron (ZOFRAN) IV   Allergies as of 02/10/2019 - Review Complete 05/08/2016  Allergen Reaction Noted  . Prednisone Swelling 01/30/2009  . Sulfa antibiotics Swelling 08/01/2010  . Latex  03/06/2016  . Lidoderm [lidocaine]  03/26/2016    Family History  Problem Relation Age of Onset  . Cancer Sister        ovarian  cancer  . Stroke Sister   . Cancer Brother        Lung Cancer    Social History   Socioeconomic History  . Marital status: Widowed    Spouse name: Not on file  . Number of children: Not on file  . Years of education: Not on file  . Highest education level: Not on file  Occupational History  . Not on file  Social Needs  . Financial resource strain: Not on file  . Food insecurity    Worry: Not on file    Inability: Not on file  . Transportation needs    Medical: Not on file    Non-medical: Not on file  Tobacco Use  . Smoking status: Former Smoker    Years: 15.00    Types: Cigarettes    Quit date: 03/16/2004    Years since quitting: 14.9  . Smokeless tobacco: Never Used  Substance and Sexual Activity  . Alcohol use: No  . Drug use: No  . Sexual activity: Not on file  Lifestyle  . Physical activity    Days per week: Not on file    Minutes per session: Not on file  . Stress: Not on file  Relationships  . Social Herbalist on phone: Not on file    Gets together: Not on file    Attends religious service: Not on file    Active member of club or organization: Not on file    Attends meetings of clubs or organizations: Not on file    Relationship status: Not on file  . Intimate partner violence    Fear of current or ex partner: Not on file    Emotionally abused: Not on file    Physically abused: Not on file    Forced  sexual activity: Not on file  Other Topics Concern  . Not on file  Social History Narrative  . Not on file    REVIEW OF SYSTEMS: Constitutional: No weakness, no fatigue. ENT:  No nose bleeds Pulm: Does not feel short of breath.  No cough. CV:  No palpitations, no LE edema.  GU:  No hematuria, no frequency GI: See HPI. Heme: Denies unusual or excessive bleeding or bruising. Transfusions: Records indicate she received 1 unit PRBCs in 2018 after ORIF of left hip fracture  Neuro:  No headaches, no peripheral tingling or numbness Derm:  No itching, no rash or sores.  Endocrine:  No sweats or chills.  No polyuria or dysuria Immunization: Not queried. Travel:  None beyond local counties in last few months.    PHYSICAL EXAM: Vital signs in last 24 hours: Vitals:   02/13/19 0529 02/13/19 1454  BP: (!) 156/80 (!) 157/78  Pulse: 72 70  Resp: 15 20  Temp: 97.9 F (36.6 C) 98.5 F (36.9 C)  SpO2: 98% 100%   Wt Readings from Last 3 Encounters:  03/18/16 98.2 kg  03/03/16 90.3 kg  01/15/12 87.4 kg    General: Pleasant.  Well-appearing, comfortable.  Does not look 85 years old. Head: No facial asymmetry or swelling.  No signs of head trauma. Eyes: No scleral icterus.  No conjunctival pallor.  EOMI. Ears: Not hard of hearing Nose: No congestion, no discharge Mouth: Full set of dentures in place.  Not removed for exam.  Tongue midline.  Mucosa pink, moist, clear. Neck: No JVD, no masses, no thyromegaly. Lungs: Clear bilaterally.  No labored breathing, no cough. Heart: RRR.  No MRG.  S1, S2 present. Abdomen: Soft.  Long midline scar with incisional hernia, abdomen bulges through scar with Valsalva effort to sit up from a laying position.  No tenderness.  Active bowel sounds.  No HSM, masses, bruits..   Rectal: Deferred Musc/Skeltl: No gross joint deformities, contractures, redness, swelling. Extremities: Slight, nonpitting, pedal/ankle edema. Neurologic: Fully alert and  appropriate.  Oriented x3.  Moves all 4 limbs, no tremors.  Strength not tested but grossly intact. Skin: No significant rashes, sores or purpura. Nodes: No cervical adenopathy Psych: Pleasant, calm, cooperative.  Intake/Output from previous day: 11/29 0701 - 11/30 0700 In: 298.3 [I.V.:298.3] Out: 0  Intake/Output this shift: Total I/O In: 0  Out: 150 [Urine:150]  LAB RESULTS: Recent Labs    02/13/19 0232  WBC 3.6*  HGB 10.1*  HCT 31.6*  PLT 130*   BMET Lab Results  Component Value Date   NA 141 02/13/2019   NA 138 03/24/2016   NA 140 03/23/2016   K 3.6 02/13/2019   K 4.0 03/24/2016   K 4.5 03/23/2016   CL 112 (H) 02/13/2019   CL 102 03/24/2016   CL 104 03/23/2016   CO2 23 02/13/2019   CO2 28 03/24/2016   CO2 29 03/23/2016   GLUCOSE 97 02/13/2019   GLUCOSE 113 (H) 03/24/2016   GLUCOSE 103 (H) 03/23/2016   BUN 16 02/13/2019   BUN 28 (H) 03/24/2016   BUN 28 (H) 03/23/2016   CREATININE 0.86 02/13/2019   CREATININE 0.94 03/24/2016   CREATININE 0.84 03/23/2016   CALCIUM 9.0 02/13/2019   CALCIUM 10.4 (H) 03/24/2016   CALCIUM 10.1 03/23/2016   LFT Recent Labs    02/13/19 0232  PROT 5.9*  ALBUMIN 2.5*  AST 16  ALT 9  ALKPHOS 68  BILITOT 0.5   PT/INR Lab Results  Component Value Date   INR 1.10 03/03/2016   INR 0.99 03/20/2011   INR 1.50 (H) 09/09/2009   Hepatitis Panel No results for input(s): HEPBSAG, HCVAB, HEPAIGM, HEPBIGM in the last 72 hours. C-Diff No components found for: CDIFF Lipase     Component Value Date/Time   LIPASE 21 03/20/2015 1742    Drugs of Abuse  No results found for: LABOPIA, COCAINSCRNUR, LABBENZ, AMPHETMU, THCU, LABBARB   RADIOLOGY STUDIES: Mr Abdomen Mrcp Wo Contrast  Result Date: 02/13/2019 CLINICAL DATA:  Incidental choledocholithiasis, normal LFTs EXAM: MRI ABDOMEN WITHOUT CONTRAST  (INCLUDING MRCP) TECHNIQUE: Multiplanar multisequence MR imaging of the abdomen was performed. Heavily T2-weighted images of the  biliary and pancreatic ducts were obtained, and three-dimensional MRCP images were rendered by post processing. COMPARISON:  Partial comparison to [facility] chest dated 02/10/2019 FINDINGS: Lower chest: Trace bilateral pleural effusions, right greater than left. Hepatobiliary: Mild hepatic steatosis.  No focal hepatic lesion. Layering subcentimeter gallstones (series 5/image 24). No intrahepatic or extrahepatic ductal dilatation. Mid/distal common duct measures 6 mm. 7 mm distal CBD stone (series 6/image 35). Pancreas:  Within normal limits. Spleen:  Within normal limits. Adrenals/Urinary Tract:  Adrenal glands are within normal limits. Bilateral renal cysts, measuring up to 14 mm in the anterior right lower kidney (series 5/image 31), simple. Suspected postprocedural changes versus involuting hemorrhagic cyst in the lateral left lower kidney (series 5/image 30). No hydronephrosis. Stomach/Bowel: Stomach and visualized bowel are unremarkable. Vascular/Lymphatic:  No evidence of abdominal aortic aneurysm. No suspicious abdominal lymphadenopathy. Other:  No abdominal ascites. Musculoskeletal: Mild degenerative changes of the lumbar spine. IMPRESSION: 7 mm distal CBD stone, corresponding to the CT finding. No intrahepatic or  extrahepatic duct dilatation. Common duct measures 6 mm. Layering gallstones, without associated inflammatory changes to suggest acute cholecystitis. Mild hepatic steatosis. Electronically Signed   By: Julian Hy M.D.   On: 02/13/2019 14:28   Mr 3d Recon At Scanner  Result Date: 02/13/2019 CLINICAL DATA:  Incidental choledocholithiasis, normal LFTs EXAM: MRI ABDOMEN WITHOUT CONTRAST  (INCLUDING MRCP) TECHNIQUE: Multiplanar multisequence MR imaging of the abdomen was performed. Heavily T2-weighted images of the biliary and pancreatic ducts were obtained, and three-dimensional MRCP images were rendered by post processing. COMPARISON:  Partial comparison to [facility] CT chest dated  02/10/2019 FINDINGS: Lower chest: Trace bilateral pleural effusions, right greater than left. Hepatobiliary: Mild hepatic steatosis.  No focal hepatic lesion. Layering subcentimeter gallstones (series 5/image 24). No intrahepatic or extrahepatic ductal dilatation. Mid/distal common duct measures 6 mm. 7 mm distal CBD stone (series 6/image 35). Pancreas:  Within normal limits. Spleen:  Within normal limits. Adrenals/Urinary Tract:  Adrenal glands are within normal limits. Bilateral renal cysts, measuring up to 14 mm in the anterior right lower kidney (series 5/image 31), simple. Suspected postprocedural changes versus involuting hemorrhagic cyst in the lateral left lower kidney (series 5/image 30). No hydronephrosis. Stomach/Bowel: Stomach and visualized bowel are unremarkable. Vascular/Lymphatic:  No evidence of abdominal aortic aneurysm. No suspicious abdominal lymphadenopathy. Other:  No abdominal ascites. Musculoskeletal: Mild degenerative changes of the lumbar spine. IMPRESSION: 7 mm distal CBD stone, corresponding to the CT finding. No intrahepatic or extrahepatic duct dilatation. Common duct measures 6 mm. Layering gallstones, without associated inflammatory changes to suggest acute cholecystitis. Mild hepatic steatosis. Electronically Signed   By: Julian Hy M.D.   On: 02/13/2019 14:28    IMPRESSION:   *     Choledocholithiasis. Stone at distal CBD.  6 mm CBD, normal LFTs so stone appears to be non-obstructing.  Pt has no GI sxs to suggest symptoms due to CBD stone.    *    Hepatic steatosis.  *      Headache in setting of hypertension.  Resolved.    *       Thrombocytopenia, platelets 130.  Chronic, longstanding dating back to 2011.    they were 98K in 02/2016  *      Normocytic anemia.  Chronic, longstanding.  Current Hb 10.1 compares favorably with measure of 8.3 in 03/2016, 7.7 in 2017.    PLAN:     *   Advance to soft diet.    *  Dr Havery Moros will see pt.  Question is if pt  needs ERCP or not.     Azucena Freed  02/13/2019, 4:13 PM Phone [phone]

## 2019-02-13 NOTE — Care Management Obs Status (Signed)
[facility] NOTIFICATION   Patient Details  Name: [name] MRN: [id number] Date of Birth: 1926-04-02   Medicare Observation Status Notification Given:  Yes    Marilu Favre, RN 02/13/2019, 11:12 AM
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
## 2019-02-14 ENCOUNTER — Telehealth: Payer: Self-pay

## 2019-02-14 LAB — CBC
HCT: 33.1 % — ABNORMAL LOW (ref 36.0–46.0)
Hemoglobin: 10.4 g/dL — ABNORMAL LOW (ref 12.0–15.0)
MCH: 28.2 pg (ref 26.0–34.0)
MCHC: 31.4 g/dL (ref 30.0–36.0)
MCV: 89.7 fL (ref 80.0–100.0)
Platelets: 139 10*3/uL — ABNORMAL LOW (ref 150–400)
RBC: 3.69 MIL/uL — ABNORMAL LOW (ref 3.87–5.11)
RDW: 13.9 % (ref 11.5–15.5)
WBC: 4 10*3/uL (ref 4.0–10.5)
nRBC: 0 % (ref 0.0–0.2)

## 2019-02-14 LAB — COMPREHENSIVE METABOLIC PANEL
ALT: 8 U/L (ref 0–44)
AST: 18 U/L (ref 15–41)
Albumin: 2.6 g/dL — ABNORMAL LOW (ref 3.5–5.0)
Alkaline Phosphatase: 69 U/L (ref 38–126)
Anion gap: 9 (ref 5–15)
BUN: 12 mg/dL (ref 8–23)
CO2: 23 mmol/L (ref 22–32)
Calcium: 9.4 mg/dL (ref 8.9–10.3)
Chloride: 109 mmol/L (ref 98–111)
Creatinine, Ser: 0.83 mg/dL (ref 0.44–1.00)
GFR calc Af Amer: >60 mL/min (ref >60)
GFR calc non Af Amer: >60 mL/min (ref >60)
Glucose, Bld: 108 mg/dL — ABNORMAL HIGH (ref 70–99)
Potassium: 3.9 mmol/L (ref 3.5–5.1)
Sodium: 141 mmol/L (ref 135–145)
Total Bilirubin: 0.5 mg/dL (ref 0.3–1.2)
Total Protein: 6.3 g/dL — ABNORMAL LOW (ref 6.5–8.1)

## 2019-02-14 MED ORDER — IRBESARTAN 150 MG PO TABS: 150.0000 mg | ORAL_TABLET | Freq: Every day | ORAL | 1 refills | Status: DC

## 2019-02-14 NOTE — Telephone Encounter (Signed)
Dr Ardis Hughs / Dr Rush Landmark please review.  How do you want to proceed with virtual appointments in the future?

## 2019-02-14 NOTE — Telephone Encounter (Signed)
Left message on machine to call back  

## 2019-02-14 NOTE — Progress Notes (Signed)
Daily Rounding Note  02/14/2019, 8:33 AM  LOS: 2 days   SUBJECTIVE:   Chief complaint:   Asymptomatic choledocholithiasis  No pain in abdomen. No nausea.  Good sized BM yesterday. Has not been walking since she was admitted.  OBJECTIVE:         Vital signs in last 24 hours:    Temp:  [97.8 F (36.6 C)-98.5 F (36.9 C)] 98.3 F (36.8 C) (12/01 0452) Pulse Rate:  [56-70] 56 (12/01 0452) Resp:  [17-20] 17 (12/01 0452) BP: (144-157)/(63-78) 144/63 (12/01 0452) SpO2:  [97 %-100 %] 97 % (12/01 0452) Last BM Date: 02/13/19 There were no vitals filed for this visit. General: looks well, sleeping on arrival, easily awakens   Heart: RRR Chest: clear bil.  No labored breathing or cough Abdomen: Soft with incisional hernia.  Not tender.  Active bowel sounds. Extremities: No CCE. Neuro/Psych: Alert.  Appropriate.  Oriented x3.  A bit unsteady as I transferred her from the bed to the bedside commode which is normally not a challenging task for the patient at home.  Intake/Output from previous day: 11/30 0701 - 12/01 0700 In: 480 [P.O.:480] Out: 150 [Urine:150]  Intake/Output this shift: No intake/output data recorded.  Lab Results: Recent Labs    02/13/19 0232 02/14/19 0218  WBC 3.6* 4.0  HGB 10.1* 10.4*  HCT 31.6* 33.1*  PLT 130* 139*   BMET Recent Labs    02/13/19 0232 02/14/19 0218  NA 141 141  K 3.6 3.9  CL 112* 109  CO2 23 23  GLUCOSE 97 108*  BUN 16 12  CREATININE 0.86 0.83  CALCIUM 9.0 9.4   LFT Recent Labs    02/13/19 0232 02/14/19 0218  PROT 5.9* 6.3*  ALBUMIN 2.5* 2.6*  AST 16 18  ALT 9 8  ALKPHOS 68 69  BILITOT 0.5 0.5   PT/INR No results for input(s): LABPROT, INR in the last 72 hours. Hepatitis Panel No results for input(s): HEPBSAG, HCVAB, HEPAIGM, HEPBIGM in the last 72 hours.  Studies/Results: Mr Abdomen Mrcp Wo Contrast Mr 3d Recon At Scanner  Result Date: 02/13/2019  CLINICAL DATA:  Incidental choledocholithiasis, normal LFTs EXAM: MRI ABDOMEN WITHOUT CONTRAST  (INCLUDING MRCP) TECHNIQUE: Multiplanar multisequence MR imaging of the abdomen was performed. Heavily T2-weighted images of the biliary and pancreatic ducts were obtained, and three-dimensional MRCP images were rendered by post processing. COMPARISON:  Partial comparison to [facility] CT chest dated 02/10/2019 FINDINGS: Lower chest: Trace bilateral pleural effusions, right greater than left. Hepatobiliary: Mild hepatic steatosis.  No focal hepatic lesion. Layering subcentimeter gallstones (series 5/image 24). No intrahepatic or extrahepatic ductal dilatation. Mid/distal common duct measures 6 mm. 7 mm distal CBD stone (series 6/image 35). Pancreas:  Within normal limits. Spleen:  Within normal limits. Adrenals/Urinary Tract:  Adrenal glands are within normal limits. Bilateral renal cysts, measuring up to 14 mm in the anterior right lower kidney (series 5/image 31), simple. Suspected postprocedural changes versus involuting hemorrhagic cyst in the lateral left lower kidney (series 5/image 30). No hydronephrosis. Stomach/Bowel: Stomach and visualized bowel are unremarkable. Vascular/Lymphatic:  No evidence of abdominal aortic aneurysm. No suspicious abdominal lymphadenopathy. Other:  No abdominal ascites. Musculoskeletal: Mild degenerative changes of the lumbar spine. IMPRESSION: 7 mm distal CBD stone, corresponding to the CT finding. No intrahepatic or extrahepatic duct dilatation. Common duct measures 6 mm. Layering gallstones, without associated inflammatory changes to suggest acute cholecystitis. Mild hepatic steatosis. Electronically Signed   By: Bertis Ruddy  [place] Pink M.D.   On: 02/13/2019 14:28    ASSESMENT:   *     Choledocholithiasis. Stone at distal CBD.  6 mm CBD, LFTs normal x 3 days.  Pt without clinical sxs or signs of biliary obstruction.    *    Hepatic steatosis.  Normal LFTs.    *        Thrombocytopenia.  Longstanding dating to 2011.      *      Normocytic anemia.  Chronic, longstanding.  Hgb stable.     PLAN   *    Possible outpatient ERCP with Dr. Ardis Hughs.  Dr. Havery Moros will discuss w patient later today. Advance to low-fat diet, if tolerated can go home later today    Azucena Freed  02/14/2019, 8:33 AM Phone [phone]

## 2019-02-14 NOTE — Discharge Summary (Signed)
Physician Discharge Summary  [name] [id number] DOB: [birth year] DOA: 02/12/2019  PCP: [name], MD  Admit date: 02/12/2019 Discharge date: 02/14/2019  Time spent: 60 minutes  Recommendations for Outpatient Follow-up:  1. Follow-up with Dr. Ardis Hughs, gastroenterology in 2 to 3 weeks.  Office will call with appointment time. 2. Follow-up with [name], MD in 2 weeks.  On follow-up patient blood pressure need to be reassessed as patient's HCTZ was discontinued and patient placed on Avapro.  Patient will need a basic metabolic profile done to follow-up on electrolytes and renal function.  Patient will need outpatient referral to vascular surgery for follow-up on thoracoabdominal aortic aneurysm.   Discharge Diagnoses:  Principal Problem:   Choledocholithiasis Active Problems:   Essential hypertension, benign   CKD (chronic kidney disease) stage 3, GFR 30-59 ml/min   Thoracoabdominal aortic aneurysm (TAAA) ([place])   Discharge Condition: Stable and improved  Diet recommendation: Heart healthy  There were no vitals filed for this visit.  History of present illness:  Per Dr. Tami Lin is a 85 y.o. female with medical history significant of HTN, breast cancer in remission, AAA repaired in 2011, CKD stage 3.  Patient went in to [facility] on 11/27 with c/o 8/10 to 10/10 headache.  Single episode of vomiting at home.  It was suspected that her headache might be a tension headache given that there BP on presentation was 186/104, she was given 10mg  labetalol, 2mg  morphine with improvement in her headache (which is only mild presently on 11/29).  During their work up however, they obtained a CXR which was questionable for mass.  CT chest was obtained which ruled out mass (instead they think its just overlapping vascular structures that they saw on CXR), but CT chest was positive for cholelithiasis and apparent choledocholithiasis with mild  gallbladder wall thickening.  Her LFTs, bili, were NL.  She has no abd pain, no nausea, only the one episode of vomiting with headache.  Transfer was requested for MRCP which their surgeon recommended however no beds were available until this evening.  She ate dinner earlier this evening in the ED.  She is asymptomatic other than mild headache at this time.   Hospital Course:  1 choledocholiathiasis Patient had presented with chest x-ray findings questionable for mass.  CT which was obtained was concerning for cholelithiasis and choledocholithiasis with mild gallbladder wall thickening.  Patient was admitted for evaluation by gastroenterology.  Patient remained asymptomatic.  MRCP was done which showed a 7 mm distal CBD stone corresponding to finding on CT scan.  No intrahepatic or extrahepatic duct dilatation.  Patient initially started on a clear liquid diet and advanced as tolerated.  Patient was seen by gastroenterology who discussed case with his advanced endoscopy colleagues as well as with patient's family.  Decision will was made to treat conservatively with close outpatient follow-up for further discussion for ERCP and further evaluation and management.  Patient was tolerating diet.  Remained asymptomatic and be discharged in stable and improved condition.  2.  Normocytic anemia Remained stable.  Outpatient follow-up.  3.  Hypertension Patient's HCTZ was discontinued during the hospitalization and patient was maintained on the pain, Avapro and Toprol-XL which will be discharged home on.  HCTZ discontinued on discharge.  Outpatient follow-up.  4.  Chronic kidney disease stage III Remained stable.  5.  Thoracoabdominal aortic aneurysm Patient noted to have a thoracoabdominal aortic aneurysm of 4.7 cm.  Will need outpatient referral to vascular surgery.  Procedures:  MRCP 02/13/2019  Consultations:  Gastroenterology: Dr.Armbruster 02/13/2019  Discharge Exam: Vitals:    02/14/19 0452 02/14/19 0947  BP: (!) 144/63   Pulse: (!) 56 72  Resp: 17   Temp: 98.3 F (36.8 C)   SpO2: 97%     General: NAD Cardiovascular: Regular rate rhythm no murmurs rubs or gallops. Respiratory: Clear to auscultation bilaterally.  No wheezes, no crackles, no rhonchi.  Discharge Instructions   Discharge Instructions    Diet - low sodium heart healthy   Complete by: As directed    Increase activity slowly   Complete by: As directed      Allergies as of 02/14/2019      Reactions   Prednisone Swelling   Sulfa Antibiotics Swelling   Latex    Lidoderm [lidocaine]    Caused rash--question allergy to adhesive      Medication List    STOP taking these medications   camphor-menthol lotion Commonly known as: SARNA   feeding supplement (PRO-STAT SUGAR FREE 64) Liqd   hydrochlorothiazide 25 MG tablet Commonly known as: HYDRODIURIL   lidocaine 5 % Commonly known as: LIDODERM   polyethylene glycol 17 g packet Commonly known as: MIRALAX / GLYCOLAX     TAKE these medications   acetaminophen 325 MG tablet Commonly known as: TYLENOL Take 650 mg by mouth every 6 (six) hours as needed for mild pain or headache. What changed: Another medication with the same name was removed. Continue taking this medication, and follow the directions you see here.   amLODipine 5 MG tablet Commonly known as: NORVASC Take 1 tablet (5 mg total) by mouth at bedtime.   baclofen 10 MG tablet Commonly known as: LIORESAL Take 0.5 tablets (5 mg total) by mouth 3 (three) times daily. What changed: additional instructions   diphenhydrAMINE 25 mg capsule Commonly known as: BENADRYL Take 25 mg by mouth every 6 (six) hours as needed for itching. What changed: Another medication with the same name was removed. Continue taking this medication, and follow the directions you see here.   docusate sodium 100 MG capsule Commonly known as: COLACE Take 100 mg by mouth daily as needed for mild  constipation.   escitalopram 10 MG tablet Commonly known as: LEXAPRO Take 10 mg by mouth daily.   feeding supplement (ENSURE ENLIVE) Liqd Take 237 mLs by mouth daily at 3 pm.   fenofibrate 145 MG tablet Commonly known as: TRICOR Take 145 mg by mouth daily.   irbesartan 150 MG tablet Commonly known as: AVAPRO Take 1 tablet (150 mg total) by mouth daily. Start taking on: February 15, 2019   methocarbamol 500 MG tablet Commonly known as: ROBAXIN Take 500 mg by mouth every 8 (eight) hours as needed for muscle spasms.   metoprolol succinate 25 MG 24 hr tablet Commonly known as: TOPROL-XL Take 0.5 tablets (12.5 mg total) by mouth daily.   Muscle Rub 10-15 % Crea Apply 1 application topically 3 (three) times daily before meals. To back muscles What changed:   when to take this  reasons to take this   rosuvastatin 10 MG tablet Commonly known as: CRESTOR Take 10 mg by mouth daily.      Allergies  Allergen Reactions  . Prednisone Swelling  . Sulfa Antibiotics Swelling  . Latex   . Lidoderm [Lidocaine]     Caused rash--question allergy to adhesive   Follow-up Information    [name], MD. Schedule an appointment as soon as possible for a visit in 2 week(s).  Specialty: Family Medicine Why: F/U IN 1-2 WEEKS. Contact information: [address] [place] [address] [phone]        Milus Banister, MD Follow up in 3 week(s).   Specialty: Gastroenterology Why: F/U IN 2-3 WEEKS. OFFICE WILL CALL WITH APPOINTMENT TIME. Contact information: [address] [place] [address] [phone]            The results of significant diagnostics from this hospitalization (including imaging, microbiology, ancillary and laboratory) are listed below for reference.    Significant Diagnostic Studies: Mr Abdomen Mrcp Wo Contrast  Result Date: 02/13/2019 CLINICAL DATA:  Incidental choledocholithiasis, normal LFTs EXAM: MRI ABDOMEN WITHOUT CONTRAST   (INCLUDING MRCP) TECHNIQUE: Multiplanar multisequence MR imaging of the abdomen was performed. Heavily T2-weighted images of the biliary and pancreatic ducts were obtained, and three-dimensional MRCP images were rendered by post processing. COMPARISON:  Partial comparison to [facility] CT chest dated 02/10/2019 FINDINGS: Lower chest: Trace bilateral pleural effusions, right greater than left. Hepatobiliary: Mild hepatic steatosis.  No focal hepatic lesion. Layering subcentimeter gallstones (series 5/image 24). No intrahepatic or extrahepatic ductal dilatation. Mid/distal common duct measures 6 mm. 7 mm distal CBD stone (series 6/image 35). Pancreas:  Within normal limits. Spleen:  Within normal limits. Adrenals/Urinary Tract:  Adrenal glands are within normal limits. Bilateral renal cysts, measuring up to 14 mm in the anterior right lower kidney (series 5/image 31), simple. Suspected postprocedural changes versus involuting hemorrhagic cyst in the lateral left lower kidney (series 5/image 30). No hydronephrosis. Stomach/Bowel: Stomach and visualized bowel are unremarkable. Vascular/Lymphatic:  No evidence of abdominal aortic aneurysm. No suspicious abdominal lymphadenopathy. Other:  No abdominal ascites. Musculoskeletal: Mild degenerative changes of the lumbar spine. IMPRESSION: 7 mm distal CBD stone, corresponding to the CT finding. No intrahepatic or extrahepatic duct dilatation. Common duct measures 6 mm. Layering gallstones, without associated inflammatory changes to suggest acute cholecystitis. Mild hepatic steatosis. Electronically Signed   By: Julian Hy M.D.   On: 02/13/2019 14:28   Mr 3d Recon At Scanner  Result Date: 02/13/2019 CLINICAL DATA:  Incidental choledocholithiasis, normal LFTs EXAM: MRI ABDOMEN WITHOUT CONTRAST  (INCLUDING MRCP) TECHNIQUE: Multiplanar multisequence MR imaging of the abdomen was performed. Heavily T2-weighted images of the biliary and pancreatic ducts were obtained, and  three-dimensional MRCP images were rendered by post processing. COMPARISON:  Partial comparison to [facility] CT chest dated 02/10/2019 FINDINGS: Lower chest: Trace bilateral pleural effusions, right greater than left. Hepatobiliary: Mild hepatic steatosis.  No focal hepatic lesion. Layering subcentimeter gallstones (series 5/image 24). No intrahepatic or extrahepatic ductal dilatation. Mid/distal common duct measures 6 mm. 7 mm distal CBD stone (series 6/image 35). Pancreas:  Within normal limits. Spleen:  Within normal limits. Adrenals/Urinary Tract:  Adrenal glands are within normal limits. Bilateral renal cysts, measuring up to 14 mm in the anterior right lower kidney (series 5/image 31), simple. Suspected postprocedural changes versus involuting hemorrhagic cyst in the lateral left lower kidney (series 5/image 30). No hydronephrosis. Stomach/Bowel: Stomach and visualized bowel are unremarkable. Vascular/Lymphatic:  No evidence of abdominal aortic aneurysm. No suspicious abdominal lymphadenopathy. Other:  No abdominal ascites. Musculoskeletal: Mild degenerative changes of the lumbar spine. IMPRESSION: 7 mm distal CBD stone, corresponding to the CT finding. No intrahepatic or extrahepatic duct dilatation. Common duct measures 6 mm. Layering gallstones, without associated inflammatory changes to suggest acute cholecystitis. Mild hepatic steatosis. Electronically Signed   By: Julian Hy M.D.   On: 02/13/2019 14:28    Microbiology: Recent Results (from the past 240  hour(s))  SARS CORONAVIRUS 2 (TAT 6-24 HRS) Nasopharyngeal Nasopharyngeal Swab     Status: None   Collection Time: 02/13/19 12:17 AM   Specimen: Nasopharyngeal Swab  Result Value Ref Range Status   SARS Coronavirus 2 NEGATIVE NEGATIVE Final    Comment: (NOTE) SARS-CoV-2 target nucleic acids are NOT DETECTED. The SARS-CoV-2 RNA is generally detectable in upper and lower respiratory specimens during the acute phase of infection.  Negative results do not preclude SARS-CoV-2 infection, do not rule out co-infections with other pathogens, and should not be used as the sole basis for treatment or other patient management decisions. Negative results must be combined with clinical observations, patient history, and epidemiological information. The expected result is Negative. Fact Sheet for Patients: SugarRoll.be Fact Sheet for Healthcare Providers: https://www.woods-mathews.com/ This test is not yet approved or cleared by the [place] FDA and  has been authorized for detection and/or diagnosis of SARS-CoV-2 by FDA under an Emergency Use Authorization (EUA). This EUA will remain  in effect (meaning this test can be used) for the duration of the COVID-19 declaration under Section 56 4(b)(1) of the Act, 21 U.S.C. section 360bbb-3(b)(1), unless the authorization is terminated or revoked sooner. Performed at [facility], [place] [address], [place], [place] [address]      Labs: Basic Metabolic Panel: Recent Labs  Lab 02/13/19 0232 02/14/19 0218  NA 141 141  K 3.6 3.9  CL 112* 109  CO2 23 23  GLUCOSE 97 108*  BUN 16 12  CREATININE 0.86 0.83  CALCIUM 9.0 9.4   Liver Function Tests: Recent Labs  Lab 02/13/19 0232 02/14/19 0218  AST 16 18  ALT 9 8  ALKPHOS 68 69  BILITOT 0.5 0.5  PROT 5.9* 6.3*  ALBUMIN 2.5* 2.6*   No results for input(s): LIPASE, AMYLASE in the last 168 hours. No results for input(s): AMMONIA in the last 168 hours. CBC: Recent Labs  Lab 02/13/19 0232 02/14/19 0218  WBC 3.6* 4.0  HGB 10.1* 10.4*  HCT 31.6* 33.1*  MCV 89.0 89.7  PLT 130* 139*   Cardiac Enzymes: No results for input(s): CKTOTAL, CKMB, CKMBINDEX, TROPONINI in the last 168 hours. BNP: BNP (last 3 results) No results for input(s): BNP in the last 8760 hours.  ProBNP (last 3 results) No results for input(s): PROBNP in the last 8760 hours.  CBG: No results for  input(s): GLUCAP in the last 168 hours.     Signed:  Irine Seal MD.  [facility] 02/14/2019, 12:39 PM

## 2019-02-14 NOTE — Telephone Encounter (Signed)
-----   Message from Yetta Flock, MD sent at 02/14/2019 10:23 AM EST ----- Regarding: choledocholithiasis patient - asymptomatic [name] this patient is being discharged today from the hospital.  Requesting a virtual visit for this patient and her daughter [name] with either Dr. Ardis Hughs or Dr. Rush Landmark in the next 2-3 weeks if possible. She lives in a retirement home and getting to the office can be tricky for them, if we can do this virtually that is their preference, to discuss possible outpatient ERCP.   Thanks much  Corlis Hove this is the patient we discuss earlier this AM. Thanks,  [name]

## 2019-02-14 NOTE — Telephone Encounter (Signed)
I spoke with Dr. Havery Moros about her earlier today.  She has asymptomatic, incidental choledocholithiasis.  LFTs have been normal.  Please offer her my first available office visit appointment.  I prefer in person if she and the family member can make it easily.  If they greatly prefer it to be virtual then I would still ask that she and a family member be there for the virtual visit because she is 62.  Thanks

## 2019-02-14 NOTE — TOC Progression Note (Addendum)
Transition of Care [facility]) - Progression Note    Patient Details  Name: [name] MRN: [id number] Date of Birth: Dec 21, 1926  [facility] of [facility] CM/SW Contact  Jacalyn Lefevre Edson Snowball, RN Phone Number: 02/14/2019, 2:51 PM  Clinical Narrative:     Patient to be discharged back to [place]. Spoke to patient and daughter [name]. [name] would like patient transported via [place] and they are aware patient returning today. PTAR paperwork completed and arranged for 3:30 pm  Spoke to [place] at [place] negative results faxed to her at [phone] Expected Discharge Plan: Home/Self Care Barriers to Discharge: Continued Medical Work up  Expected Discharge Plan and Services Expected Discharge Plan: Home/Self Care   Discharge Planning Services: CM Consult Post Acute Care Choice: Durable Medical Equipment Living arrangements for the past 2 months: Apartment Expected Discharge Date: 02/14/19               DME Arranged: N/A         HH Arranged: NA           Social Determinants of Health (SDOH) Interventions    Readmission Risk Interventions No flowsheet data found.

## 2019-02-15 NOTE — Telephone Encounter (Signed)
The pt has been scheduled for 04/04/19 at 3:40 pm in person.  The pt was provided with this information and she was allowed to read back the appt date and time.  She also says she will have her daughter call to confirm. I found the daughter's number and gave her the appt information.

## 2019-04-04 ENCOUNTER — Ambulatory Visit: Payer: Medicare Other | Admitting: Gastroenterology

## 2019-05-11 ENCOUNTER — Ambulatory Visit: Payer: Medicare Other | Admitting: Gastroenterology

## 2019-06-21 ENCOUNTER — Ambulatory Visit: Payer: Medicare Other | Admitting: Gastroenterology

## 2020-04-22 ENCOUNTER — Emergency Department (HOSPITAL_COMMUNITY): Payer: Medicare Other

## 2020-04-22 ENCOUNTER — Encounter (HOSPITAL_COMMUNITY): Payer: Self-pay

## 2020-04-22 ENCOUNTER — Other Ambulatory Visit: Payer: Self-pay

## 2020-04-22 ENCOUNTER — Observation Stay (HOSPITAL_COMMUNITY)
Admission: EM | Admit: 2020-04-22 | Discharge: 2020-04-23 | Disposition: A | Discharge status: Home / Self Care | Payer: Medicare Other | Attending: Internal Medicine | Admitting: Internal Medicine

## 2020-04-22 DIAGNOSIS — W19XXXA Unspecified fall, initial encounter: Secondary | ICD-10-CM | POA: Diagnosis not present

## 2020-04-22 DIAGNOSIS — Z79899 Other long term (current) drug therapy: Secondary | ICD-10-CM | POA: Diagnosis not present

## 2020-04-22 DIAGNOSIS — C50912 Malignant neoplasm of unspecified site of left female breast: Secondary | ICD-10-CM | POA: Insufficient documentation

## 2020-04-22 DIAGNOSIS — I714 Abdominal aortic aneurysm, without rupture: Secondary | ICD-10-CM | POA: Insufficient documentation

## 2020-04-22 DIAGNOSIS — F419 Anxiety disorder, unspecified: Secondary | ICD-10-CM | POA: Insufficient documentation

## 2020-04-22 DIAGNOSIS — Z20822 Contact with and (suspected) exposure to covid-19: Secondary | ICD-10-CM | POA: Insufficient documentation

## 2020-04-22 DIAGNOSIS — M549 Dorsalgia, unspecified: Secondary | ICD-10-CM | POA: Diagnosis present

## 2020-04-22 DIAGNOSIS — S2242XA Multiple fractures of ribs, left side, initial encounter for closed fracture: Secondary | ICD-10-CM | POA: Insufficient documentation

## 2020-04-22 DIAGNOSIS — Z87891 Personal history of nicotine dependence: Secondary | ICD-10-CM | POA: Diagnosis not present

## 2020-04-22 DIAGNOSIS — Y92199 Unspecified place in other specified residential institution as the place of occurrence of the external cause: Secondary | ICD-10-CM | POA: Insufficient documentation

## 2020-04-22 DIAGNOSIS — N179 Acute kidney failure, unspecified: Secondary | ICD-10-CM | POA: Diagnosis not present

## 2020-04-22 DIAGNOSIS — Z23 Encounter for immunization: Secondary | ICD-10-CM | POA: Diagnosis not present

## 2020-04-22 DIAGNOSIS — I10 Essential (primary) hypertension: Secondary | ICD-10-CM | POA: Diagnosis not present

## 2020-04-22 DIAGNOSIS — F32A Depression, unspecified: Secondary | ICD-10-CM | POA: Insufficient documentation

## 2020-04-22 LAB — CBC WITH DIFFERENTIAL/PLATELET
Abs Immature Granulocytes: 0.02 10*3/uL (ref 0.00–0.07)
Basophils Absolute: 0 10*3/uL (ref 0.0–0.1)
Basophils Relative: 1 %
Eosinophils Absolute: 0.2 10*3/uL (ref 0.0–0.5)
Eosinophils Relative: 4 %
HCT: 35.1 % — ABNORMAL LOW (ref 36.0–46.0)
Hemoglobin: 11.4 g/dL — ABNORMAL LOW (ref 12.0–15.0)
Immature Granulocytes: 1 %
Lymphocytes Relative: 19 %
Lymphs Abs: 0.7 10*3/uL (ref 0.7–4.0)
MCH: 30.3 pg (ref 26.0–34.0)
MCHC: 32.5 g/dL (ref 30.0–36.0)
MCV: 93.4 fL (ref 80.0–100.0)
Monocytes Absolute: 0.3 10*3/uL (ref 0.1–1.0)
Monocytes Relative: 7 %
Neutro Abs: 2.7 10*3/uL (ref 1.7–7.7)
Neutrophils Relative %: 68 %
Platelets: 166 10*3/uL (ref 150–400)
RBC: 3.76 MIL/uL — ABNORMAL LOW (ref 3.87–5.11)
RDW: 13.4 % (ref 11.5–15.5)
WBC: 3.9 10*3/uL — ABNORMAL LOW (ref 4.0–10.5)
nRBC: 0 % (ref 0.0–0.2)

## 2020-04-22 LAB — URINALYSIS, ROUTINE W REFLEX MICROSCOPIC
Bilirubin Urine: NEGATIVE
Glucose, UA: NEGATIVE mg/dL
Hgb urine dipstick: NEGATIVE
Ketones, ur: NEGATIVE mg/dL
Nitrite: NEGATIVE
Protein, ur: NEGATIVE mg/dL
Specific Gravity, Urine: 1.014 (ref 1.005–1.030)
pH: 7 (ref 5.0–8.0)

## 2020-04-22 LAB — BASIC METABOLIC PANEL
Anion gap: 8 (ref 5–15)
BUN: 38 mg/dL — ABNORMAL HIGH (ref 8–23)
CO2: 24 mmol/L (ref 22–32)
Calcium: 10.6 mg/dL — ABNORMAL HIGH (ref 8.9–10.3)
Chloride: 102 mmol/L (ref 98–111)
Creatinine, Ser: 1.7 mg/dL — ABNORMAL HIGH (ref 0.44–1.00)
GFR, Estimated: 28 mL/min — ABNORMAL LOW (ref >60)
Glucose, Bld: 98 mg/dL (ref 70–99)
Potassium: 4.2 mmol/L (ref 3.5–5.1)
Sodium: 134 mmol/L — ABNORMAL LOW (ref 135–145)

## 2020-04-22 LAB — SARS CORONAVIRUS 2 (TAT 6-24 HRS): SARS Coronavirus 2: NEGATIVE

## 2020-04-22 LAB — TROPONIN I (HIGH SENSITIVITY)
Troponin I (High Sensitivity): 10 ng/L (ref <18)
Troponin I (High Sensitivity): 13 ng/L

## 2020-04-22 LAB — CK: Total CK: 78 U/L (ref 38–234)

## 2020-04-22 MED ORDER — ONDANSETRON HCL 4 MG PO TABS: 4.0000 mg | ORAL_TABLET | Freq: Four times a day (QID) | ORAL | Status: DC | PRN

## 2020-04-22 MED ORDER — SODIUM CHLORIDE 0.9 % IV SOLN: INTRAVENOUS | Status: AC

## 2020-04-22 MED ORDER — ONDANSETRON HCL 4 MG/2ML IJ SOLN: 4.0000 mg | Freq: Four times a day (QID) | INTRAMUSCULAR | Status: DC | PRN

## 2020-04-22 MED ORDER — HYDROCODONE-ACETAMINOPHEN 5-325 MG PO TABS
1.0000 | ORAL_TABLET | Freq: Four times a day (QID) | ORAL | Status: DC | PRN
Start: 2020-04-22 — End: 2020-04-23
  Administered 2020-04-22: 1 via ORAL
  Filled 2020-04-22: qty 1

## 2020-04-22 MED ORDER — ENOXAPARIN SODIUM 30 MG/0.3ML ~~LOC~~ SOLN
30.0000 mg | SUBCUTANEOUS | Status: DC
  Administered 2020-04-22: 30 mg via SUBCUTANEOUS
  Filled 2020-04-22: qty 0.3

## 2020-04-22 MED ORDER — ACETAMINOPHEN 325 MG PO TABS: 650.0000 mg | ORAL_TABLET | Freq: Four times a day (QID) | ORAL | Status: DC | PRN

## 2020-04-22 MED ORDER — SODIUM CHLORIDE 0.9 % IV BOLUS
500.0000 mL | Freq: Once | INTRAVENOUS | Status: AC
  Administered 2020-04-22: 500 mL via INTRAVENOUS

## 2020-04-22 MED ORDER — IOHEXOL 350 MG/ML SOLN: 100.0000 mL | Freq: Once | INTRAVENOUS | Status: DC | PRN

## 2020-04-22 MED ORDER — POLYETHYLENE GLYCOL 3350 17 G PO PACK: 17.0000 g | PACK | Freq: Every day | ORAL | Status: DC | PRN

## 2020-04-22 MED ORDER — MORPHINE SULFATE (PF) 4 MG/ML IV SOLN
4.0000 mg | Freq: Once | INTRAVENOUS | Status: AC
  Administered 2020-04-22: 4 mg via INTRAVENOUS
  Filled 2020-04-22: qty 1

## 2020-04-22 MED ORDER — METOPROLOL SUCCINATE ER 25 MG PO TB24
12.5000 mg | ORAL_TABLET | Freq: Every day | ORAL | Status: DC
  Administered 2020-04-22 – 2020-04-23 (×2): 12.5 mg via ORAL
  Filled 2020-04-22 (×2): qty 1

## 2020-04-22 MED ORDER — ACETAMINOPHEN 650 MG RE SUPP: 650.0000 mg | Freq: Four times a day (QID) | RECTAL | Status: DC | PRN

## 2020-04-22 MED ORDER — DONEPEZIL HCL 5 MG PO TABS
5.0000 mg | ORAL_TABLET | Freq: Every day | ORAL | Status: DC
  Administered 2020-04-22: 5 mg via ORAL
  Filled 2020-04-22: qty 1

## 2020-04-22 MED ORDER — AMLODIPINE BESYLATE 5 MG PO TABS
5.0000 mg | ORAL_TABLET | Freq: Every day | ORAL | Status: DC
  Administered 2020-04-22: 5 mg via ORAL
  Filled 2020-04-22: qty 1

## 2020-04-22 MED ORDER — ESCITALOPRAM OXALATE 10 MG PO TABS
10.0000 mg | ORAL_TABLET | Freq: Every day | ORAL | Status: DC
  Administered 2020-04-22 – 2020-04-23 (×2): 10 mg via ORAL
  Filled 2020-04-22 (×2): qty 1

## 2020-04-22 MED ORDER — PNEUMOCOCCAL VAC POLYVALENT 25 MCG/0.5ML IJ INJ
0.5000 mL | INJECTION | INTRAMUSCULAR | Status: AC
  Administered 2020-04-23: 0.5 mL via INTRAMUSCULAR
  Filled 2020-04-22: qty 0.5

## 2020-04-22 NOTE — ED Notes (Signed)
Receiving nurse can't take report at this time. Will call back.

## 2020-04-22 NOTE — ED Triage Notes (Signed)
Pt brought to ED via [facility] from [facility] in [place]. Pt had fall on Saturday. Pt c/o lower back pain and left flank pain. Denies LOC. Pt doesn't remember details of fall.

## 2020-04-22 NOTE — ED Notes (Signed)
purewick placed with pt permission.

## 2020-04-22 NOTE — H&P (Addendum)
History and Physical    [name] [id number] DOB: 11-08-1926 DOA: 04/22/2020  PCP: [name], MD   Patient coming from: [place], [place].  I have personally briefly reviewed patient's old medical records in [place]  Chief Complaint: Fall  HPI: [name] is a 87 y.o. female with medical history significant for hypertension, AAA, gout, psoriasis. Was to the ED from nursing home via EMS reports of a fall Saturday, but patient tells me the fall was yesterday Sunday.  She complains of lower back and left flank pain.  Patient cannot remember the details of the fall.  She does remember having any dizziness, she denies chest pain, or difficulty breathing.  Patient was found sitting on the floor.  Family came to see patient, patient refused to come to the ED.  This morning patient was not a great deal of pain and was unable to get up hence EMS was called.  Area of skin discoloration was noted to left flank. Patient denies pain with urination or urinary frequency.,  She denies vomiting,  loose stools, and appetite has been good.  ED Course: Temp 98.6.  Heart rate 50s to 70s.  Blood pressure systolic [id number] to [id number].  O2 sats greater than 96 on room air.  Unremarkable CBC.  Creatinine elevated 1.7.  UA with trace leukocytes rare bacteria 11-20 WBCs.  Meeting included head and cervical CT which were without acute abnormality.  . imaging showed acute rib fractures involving the eighth 10th and 11th left posterior ribs.  Abdominal CT showed left proximal to mid ureteral calculus without hydronephrosis, and cholecystitis without pericholecystic stranding.   584ml bolus given.  Hospitalist to admit for fall, and rib fractures.  Review of Systems: As per HPI all other systems reviewed and negative.  Past Medical History:  Diagnosis Date  . Abdominal aortic aneurysm ([facility])   . Arthritis   . Cancer (HCC)    Left Breast , Dr. Cherylann Banas  . Colon polyps    Benign  .  Complication of anesthesia   . Gout   . H/O hiatal hernia   . Hyperlipidemia   . Hypertension    sees Dr. Edrick Oh, saw last approx. 1 month ago  . Osteoporosis   . PONV (postoperative nausea and vomiting)   . Psoriasis   . Splenic artery aneurysm [facility])     Past Surgical History:  Procedure Laterality Date  . ABDOMINAL AORTIC ANEURYSM REPAIR  09/09/2009   Dacron Graft repair  by Dr. Ruta Hinds  . EYE SURGERY     left cataract removal  . FEMUR IM NAIL Left 03/03/2016   Procedure: INTRAMEDULLARY (IM) NAIL FEMORAL;  Surgeon: Renette Butters, MD;  Location: [place];  Service: Orthopedics;  Laterality: Left;  . HERNIA REPAIR    . JOINT REPLACEMENT  2008   Right Total Hip  . MASTECTOMY  08/22/2001   Left  . ORIF DISTAL RADIUS FRACTURE       reports that she quit smoking about 16 years ago. Her smoking use included cigarettes. She quit after 15.00 years of use. She has never used smokeless tobacco. She reports that she does not drink alcohol and does not use drugs.  Allergies  Allergen Reactions  . Prednisone Swelling  . Sulfa Antibiotics Swelling  . Latex   . Lidoderm [Lidocaine]     Caused rash--question allergy to adhesive    Family History  Problem Relation Age of Onset  . Cancer Sister  ovarian cancer  . Stroke Sister   . Cancer Brother        Lung Cancer     Prior to Admission medications   Medication Sig Start Date End Date Taking? Authorizing Provider  acetaminophen (TYLENOL) 325 MG tablet Take 650 mg by mouth every 6 (six) hours as needed for mild pain or headache.   Yes [provider]  amLODipine (NORVASC) 5 MG tablet Take 1 tablet (5 mg total) by mouth at bedtime. 03/27/16  Yes Love, Ivan Anchors, PA-C  betamethasone dipropionate 0.05 % lotion Apply 1 application topically daily. 04/16/20  Yes [provider]  donepezil (ARICEPT) 5 MG tablet Take 5 mg by mouth at bedtime.   Yes [provider]  escitalopram (LEXAPRO) 10 MG tablet  Take 10 mg by mouth daily.   Yes [provider]  fenofibrate (TRICOR) 145 MG tablet Take 145 mg by mouth daily.   Yes [provider]  hydrochlorothiazide (HYDRODIURIL) 25 MG tablet Take 25 mg by mouth daily.   Yes [provider]  irbesartan (AVAPRO) 150 MG tablet Take 1 tablet (150 mg total) by mouth daily. 02/15/19  Yes Eugenie Filler, MD  metoprolol succinate (TOPROL-XL) 25 MG 24 hr tablet Take 0.5 tablets (12.5 mg total) by mouth daily. 03/27/16  Yes Love, Ivan Anchors, PA-C  Multiple Vitamins-Minerals (MULTIVITAMIN ADULTS PO) Take 1 tablet by mouth daily.   Yes [provider]  baclofen (LIORESAL) 10 MG tablet Take 0.5 tablets (5 mg total) by mouth 3 (three) times daily. Patient not taking: Reported on 04/22/2020 03/24/16   Love, Ivan Anchors, PA-C  feeding supplement, ENSURE ENLIVE, (ENSURE ENLIVE) LIQD Take 237 mLs by mouth daily at 3 pm. Patient not taking: No sig reported 03/24/16   Love, Ivan Anchors, PA-C  Menthol-Methyl Salicylate (MUSCLE RUB) 10-15 % CREA Apply 1 application topically 3 (three) times daily before meals. To back muscles Patient not taking: Reported on 04/22/2020 03/24/16   Flora Lipps    Physical Exam: Vitals:   04/22/20 1530 04/22/20 1600 04/22/20 1630 04/22/20 1700  BP: (!) 123/58 132/62 128/77 138/60  Pulse: 68 62 61 63  Resp: 16 16 15 13   Temp:      TempSrc:      SpO2: 95% 95% 98% 100%  Weight:      Height:        Constitutional: NAD, calm, comfortable Vitals:   04/22/20 1530 04/22/20 1600 04/22/20 1630 04/22/20 1700  BP: (!) 123/58 132/62 128/77 138/60  Pulse: 68 62 61 63  Resp: 16 16 15 13   Temp:      TempSrc:      SpO2: 95% 95% 98% 100%  Weight:      Height:       Eyes: PERRL, lids and conjunctivae normal ENMT: Mucous membranes are dry.  Neck: normal, supple, no masses, no thyromegaly Respiratory: clear to auscultation bilaterally, no wheezing, no crackles. Normal respiratory effort. No accessory muscle use.   Cardiovascular: Regular rate and rhythm,  No extremity edema. 2+ pedal pulses. No carotid bruits.  Abdomen: no tenderness, no masses palpated. No hepatosplenomegaly. Bowel sounds positive.  Musculoskeletal: no clubbing / cyanosis. No joint deformity upper and lower extremities. Good ROM, no contractures. Normal muscle tone.  Skin: Ecchymosis to left flank area, no rashes, lesions, ulcers. No induration Neurologic: No apparent cranial abnormality, moving extremities spontaneously. Psychiatric: Normal judgment and insight. Alert and oriented x 3. Normal mood.   Labs on Admission: I have personally  reviewed following labs and imaging studies  CBC: Recent Labs  Lab 04/22/20 1208  WBC 3.9*  NEUTROABS 2.7  HGB 11.4*  HCT 35.1*  MCV 93.4  PLT [id number]   Basic Metabolic Panel: Recent Labs  Lab 04/22/20 1208  NA 134*  K 4.2  CL 102  CO2 24  GLUCOSE 98  BUN 38*  CREATININE 1.70*  CALCIUM 10.6*   Cardiac Enzymes: Recent Labs  Lab 04/22/20 1208  CKTOTAL 78   Urine analysis:    Component Value Date/Time   COLORURINE YELLOW 04/22/2020 1240   APPEARANCEUR HAZY (A) 04/22/2020 1240   LABSPEC 1.014 04/22/2020 1240   PHURINE 7.0 04/22/2020 1240   GLUCOSEU NEGATIVE 04/22/2020 1240   HGBUR NEGATIVE 04/22/2020 1240   BILIRUBINUR NEGATIVE 04/22/2020 1240   KETONESUR NEGATIVE 04/22/2020 1240   PROTEINUR NEGATIVE 04/22/2020 1240   UROBILINOGEN 0.2 03/20/2011 2205   NITRITE NEGATIVE 04/22/2020 1240   LEUKOCYTESUR TRACE (A) 04/22/2020 1240    Radiological Exams on Admission: CT ABDOMEN PELVIS WO CONTRAST  Result Date: 04/22/2020 CLINICAL DATA:  LEFT flank ecchymosis post fall 2 days ago. EXAM: CT ABDOMEN AND PELVIS WITHOUT CONTRAST TECHNIQUE: Multidetector CT imaging of the abdomen and pelvis was performed following the standard protocol without IV contrast. COMPARISON:  March 20, 2015 FINDINGS: Lower chest: Basilar atelectasis. Basilar scarring, similar to the prior study. Heart size  stable, incompletely imaged however with signs of coronary artery calcification. Hepatobiliary: Liver contour is smooth. No perihepatic fluid. Cholelithiasis. No pericholecystic stranding. No gross biliary duct distension but with filling defect at the level of the distal common bile duct compatible with calculus on image 27 of series 2) slight increase in biliary duct dilation previously approximately 7 mm as measured on CT now approximately 8 mm, not well evaluated currently. Pancreas: Normal pancreas in terms of contour. No peripancreatic inflammation with signs of mild pancreatic atrophy. Spleen: Spleen is normal. Adrenals/Urinary Tract: Adrenal glands are normal. Mild to moderate cortical parenchymal loss of the bilateral kidneys. Low-density exophytic lesion in the upper pole likely a cyst. Other areas described on prior imaging studies not well seen in the absence of contrast. No nephrolithiasis. No perinephric stranding. Is interval migration of a small LEFT-sided calculus into the proximal LEFT ureter. 5 mm calculus on image 58 of series 5. Another calcification near the ovary is compatible with a small phlebolith. Urinary bladder with smooth contour. No RIGHT-sided ureteral calculus. Stomach/Bowel: No acute gastrointestinal process. Signs of ventral abdominal wall reconstruction with rectus diastasis but without frank herniation of bowel. Vascular/Lymphatic: Calcified atheromatous plaque of the abdominal aorta. Stable dilation of the infrarenal abdominal aorta up to 2.8 cm. There is however increasing dilation of a distal thoracic aortic aneurysm, fusiform dilation that involves the distal thoracic aorta above the aortic hiatus now proximally 4.7 cm greatest AP dimension previously approximately 4.1 cm. Stable peripherally calcified splenic artery aneurysm at 15 mm. There is no gastrohepatic or hepatoduodenal ligament lymphadenopathy. No retroperitoneal or mesenteric lymphadenopathy. No pelvic sidewall  lymphadenopathy. Uterus and adnexa unremarkable by CT. Reproductive: Unremarkable appearance of reproductive structures. Other: No ascites.  No free air. Musculoskeletal: LEFT flank stranding, associated with LEFT-sided rib fractures of LEFT ribs 9, 10 and 11 posteriorly with mild displacement of tenth and eleventh ribs. Chronic compression fractures in the lower thoracic and upper lumbar spine with similar appearance with changes of cement augmentation at T9 as before. See dedicated lumbar spine CT for further detail. Post RIGHT hip arthroplasty and LEFT femoral ORIF. IMPRESSION: 1.  LEFT proximal to mid ureteral calculus without hydronephrosis, 5 mm calculus. 2. Choledocholithiasis with slight increase in biliary duct caliber but without pericholecystic stranding. Correlate with any RIGHT upper quadrant pain in this patient who also displays numerous layering calculi in the lumen of the gallbladder. 3. LEFT flank stranding associated with LEFT-sided rib fractures of LEFT ribs 9, 10 and 11 posteriorly with mild displacement of tenth and eleventh ribs. 4. Increasing dilation of the distal thoracic aorta now 4.8 cm greatest axial dimension, increased compared to 4.3 cm on January of 2017. Sagittal mention approximately 4.7 cm as compared to 4.2 cm on the prior study. Given this enlargement now near 5 cm vascular surgical consultation is suggested with six-month follow-up or as clinically warranted with CT for further evaluation. 5. Stable dilation of the infrarenal abdominal aorta up to 2.8 cm. 6. Stable peripherally calcified splenic artery aneurysm at 15 mm. 7. Aortic atherosclerosis. Aortic Atherosclerosis (ICD10-I70.0). Electronically Signed   By: Zetta Bills M.D.   On: 04/22/2020 13:46   DG Ribs Unilateral W/Chest Left  Result Date: 04/22/2020 CLINICAL DATA:  [name] on Saturday.  Left rib pain. EXAM: LEFT RIBS AND CHEST - 3+ VIEW COMPARISON:  Chest x-ray 07/13/2019 and chest CT 02/10/2019 FINDINGS: The  cardiac silhouette, mediastinal and hilar contours are stable. Stable tortuosity and calcification of the thoracic aorta. No acute pulmonary findings.  No pneumothorax or pleural effusion. Remote healed appearing rib fractures are noted. Suspect acute rib fractures also. This appears to involve the seventh and eighth ribs. No pneumothorax or pleural effusion. IMPRESSION: 1. Suspect acute rib fractures involving the seventh and eighth ribs. 2. Remote healed rib fractures are also noted. 3. No pneumothorax or pleural effusion. Electronically Signed   By: Marijo Sanes M.D.   On: 04/22/2020 13:48   CT Head Wo Contrast  Result Date: 04/22/2020 CLINICAL DATA:  Fall. EXAM: CT HEAD WITHOUT CONTRAST CT CERVICAL SPINE WITHOUT CONTRAST TECHNIQUE: Multidetector CT imaging of the head and cervical spine was performed following the standard protocol without intravenous contrast. Multiplanar CT image reconstructions of the cervical spine were also generated. COMPARISON:  CT head 07/13/2019 FINDINGS: CT HEAD FINDINGS Brain: Generalized atrophy. Mild white matter hypodensity bilaterally, unchanged. Negative for acute infarct, hemorrhage, mass. Vascular: Negative for hyperdense vessel. Atherosclerotic calcification in the carotid artery bilaterally. Skull: Negative for skull fracture. Sinuses/Orbits: Mild mucosal edema paranasal sinuses. Bilateral cataract extraction Other: None CT CERVICAL SPINE FINDINGS Alignment: Mild anterolisthesis C4-5 and C5-6. Skull base and vertebrae: Negative for fracture. Soft tissues and spinal canal: Multiple right thyroid nodules. The largest nodule 2.4 cm. Multiple right thyroid calcifications. Hypoplastic left lobe of the thyroid. Disc levels: Multilevel disc and facet degeneration in the cervical spine. No significant stenosis. Upper chest: Negative Other: None IMPRESSION: 1. No acute intracranial abnormality. Chronic atrophy and chronic white matter ischemia 2. Negative for cervical spine  fracture 3. Right thyroid nodule up to 2.5 cm in diameter. Recommend thyroid ultrasound. (Ref: J Am Coll Radiol. 2015 Feb;12(2): 143-50). Electronically Signed   By: Franchot Gallo M.D.   On: 04/22/2020 13:38   CT Chest Wo Contrast  Result Date: 04/22/2020 CLINICAL DATA:  [name].  Rib fractures. EXAM: CT CHEST WITHOUT CONTRAST TECHNIQUE: Multidetector CT imaging of the chest was performed following the standard protocol without IV contrast. COMPARISON:  Radiographs, same date. FINDINGS: Cardiovascular: The heart is within normal limits in size for age. No pericardial effusion. There is tortuosity, ectasia and calcification of the thoracic aorta. 4.5 cm aneurysm noted  along the proximal aortic arch. This is unchanged since 2020. There is also an aneurysm involving the distal thoracic aorta near its junction with the abdominal aorta. This measured a maximum of 4.8 cm and is also unchanged. Stable three-vessel coronary artery calcifications. Mediastinum/Nodes: Stable right-sided multinodular thyroid goiter. In the setting of significant comorbidities or limited life expectancy, no follow-up recommended (ref: J Am Coll Radiol. 2015 Feb;12(2): 143-50). Stable scattered mediastinal and hilar lymph nodes. No mass or overt adenopathy. The esophagus is grossly normal. Lungs/Pleura: Mild emphysematous changes and chronic bronchitic changes but no infiltrates or worrisome pulmonary lesions. Minimal dependent bibasilar atelectasis. Stable mild eventration of the left hemidiaphragm. Scattered branching radiodensities and both lungs likely related to prior vertebroplasty with embolization of some of the bone cement. Upper Abdomen: No significant upper abdominal findings. There is a stable 18 mm calcified splenic artery aneurysm noted. Musculoskeletal: There are bilateral healed rib fractures mainly anteriorly. There are also acute fractures eighth, tenth and eleventh left posterior ribs. Vertebral augmentation changes are noted  and L1 and L2 compression fractures. IMPRESSION: 1. Stable 4.5 cm aneurysm along the proximal aortic arch and 4.8 cm aneurysm involving the distal thoracic aorta near its junction with the abdominal aorta. Given the patient's age and stability no further imaging evaluation is necessary. 2. Stable bilateral healed rib fractures. 3. Acute eighth, tenth and eleventh left posterior rib fractures. 4. Stable branching radiodensities in both lungs likely related to prior vertebroplasty with embolization of some of the bone cement. 5. Mild emphysematous changes and chronic bronchitic changes but no infiltrates or worrisome pulmonary lesions. Aortic Atherosclerosis (ICD10-I70.0) and Emphysema (ICD10-J43.9). Electronically Signed   By: Marijo Sanes M.D.   On: 04/22/2020 16:51   CT Cervical Spine Wo Contrast  Result Date: 04/22/2020 CLINICAL DATA:  Fall. EXAM: CT HEAD WITHOUT CONTRAST CT CERVICAL SPINE WITHOUT CONTRAST TECHNIQUE: Multidetector CT imaging of the head and cervical spine was performed following the standard protocol without intravenous contrast. Multiplanar CT image reconstructions of the cervical spine were also generated. COMPARISON:  CT head 07/13/2019 FINDINGS: CT HEAD FINDINGS Brain: Generalized atrophy. Mild white matter hypodensity bilaterally, unchanged. Negative for acute infarct, hemorrhage, mass. Vascular: Negative for hyperdense vessel. Atherosclerotic calcification in the carotid artery bilaterally. Skull: Negative for skull fracture. Sinuses/Orbits: Mild mucosal edema paranasal sinuses. Bilateral cataract extraction Other: None CT CERVICAL SPINE FINDINGS Alignment: Mild anterolisthesis C4-5 and C5-6. Skull base and vertebrae: Negative for fracture. Soft tissues and spinal canal: Multiple right thyroid nodules. The largest nodule 2.4 cm. Multiple right thyroid calcifications. Hypoplastic left lobe of the thyroid. Disc levels: Multilevel disc and facet degeneration in the cervical spine. No  significant stenosis. Upper chest: Negative Other: None IMPRESSION: 1. No acute intracranial abnormality. Chronic atrophy and chronic white matter ischemia 2. Negative for cervical spine fracture 3. Right thyroid nodule up to 2.5 cm in diameter. Recommend thyroid ultrasound. (Ref: J Am Coll Radiol. 2015 Feb;12(2): 143-50). Electronically Signed   By: Franchot Gallo M.D.   On: 04/22/2020 13:38   CT L-SPINE NO CHARGE  Result Date: 04/22/2020 CLINICAL DATA:  Fall.  Low back pain. EXAM: CT LUMBAR SPINE WITHOUT CONTRAST TECHNIQUE: Multidetector CT imaging of the lumbar spine was performed without intravenous contrast administration. Multiplanar CT image reconstructions were also generated. COMPARISON:  Lumbar MRI 12/19/2006.  CT abdomen pelvis 03/21/2011 FINDINGS: Segmentation: S1 is largely lumbarized. Alignment: Normal Vertebrae: Severe compression fracture T12 unchanged from prior study. Mild bony retropulsion without stenosis. Severe compression fracture L1 not seen on prior  studies but appears chronic. Mild bony retropulsion without stenosis. No other fracture or mass lesion. Paraspinal and other soft tissues: Atherosclerotic calcification abdominal aorta and iliac arteries. Aneurysm of the lower thoracic aorta at the region of the diaphragmatic hiatus. Aneurysmal dilatation 4.9 cm, previously 3.3 cm in 2013. Disc levels: T11-12: Disc degeneration. Posterior retropulsion of bone related to T12 fracture without significant stenosis T12-L1: Mild disc degeneration. Mild bony retropulsion of L1 fracture without significant spinal stenosis. L1-2: Mild disc degeneration and spurring.  No significant stenosis L2-3: Mild disc degeneration and disc bulging without stenosis L3-4: Mild disc bulging and spurring.  Negative for stenosis L4-5: Mild disc and facet degeneration.  Negative for stenosis L5-S1: Moderate facet degeneration.  Negative for stenosis. IMPRESSION: 1. S1 is largely lumbarized 2. Severe compression  fracture T12 and L1 appear chronic. No acute fracture 3. Multilevel degenerative change without significant spinal stenosis 4. Aneurysmal dilatation distal thoracic aorta 4.9 cm. Previously 3.3 cm 2013 Recommend follow-up CT/MR every 6 months and vascular consultation. This recommendation follows ACR consensus guidelines: White Paper of the ACR Incidental Findings Committee II on Vascular Findings. J Am Coll Radiol 2013; 10:789-794. Electronically Signed   By: Franchot Gallo M.D.   On: 04/22/2020 13:32    EKG: None.   Assessment/Plan Principal Problem:   Fall Active Problems:   Essential hypertension with goal blood pressure less than 130/80   Multiple left rib fractures  Fall with multiple left rib fractures- unknown circumstances surrounding fall.  Acute kidney injury suggest possible prerenal/hypovolemic etiology.  Chest imaging shows multiple rib fractures involving left posterior eighth 10th and 11th ribs.  Head and cervical CT without acute abnormality. -Incentive spirometry -Hydrocodone as needed -Obtain orthostatic vitals -Obtain EKG -Trend troponin - Monitor on tele - Hydrate - PT eval  Acute kidney injury-creatinine 1.7, elevated from baseline about 0.8-0.9.  She is on diuretics and ARBs.  She denies GI losses. -500 mill bolus given, continue N/s 100cc/hr x 15hrs - BMP a.m - Hold chlorthalidone and irbesartan  Hypertension-stable. -Continue metoprolol, Norvasc 5 -Hold chlorthalidone irbesartan for now  Depression/anxiety -Resume Lexapro  DVT prophylaxis: Lovenox Code Status: Full code-confirmed with patient at bedside Family Communication: None at bedside.  All 3 patients children Adela Glimpse and son Coralyn Pear are patients primary decision-makers. Disposition Plan: ~ 1- 2 days Consults called: None.  Admission status: Obs, tele   Bethena Roys MD [facility]  04/22/2020, 8:13 PM

## 2020-04-22 NOTE — ED Provider Notes (Signed)
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 Final diagnoses:  Back pain  Fall, initial encounter  Closed fracture of multiple ribs of left side, initial encounter    Rx / DC Orders ED Discharge Orders    None       Eustaquio Maize, PA-C 04/22/20 1714    Davonna Belling, MD 04/23/20 1011

## 2020-04-23 DIAGNOSIS — M549 Dorsalgia, unspecified: Secondary | ICD-10-CM | POA: Diagnosis not present

## 2020-04-23 DIAGNOSIS — I10 Essential (primary) hypertension: Secondary | ICD-10-CM | POA: Diagnosis not present

## 2020-04-23 DIAGNOSIS — W19XXXA Unspecified fall, initial encounter: Secondary | ICD-10-CM | POA: Diagnosis not present

## 2020-04-23 DIAGNOSIS — S2242XA Multiple fractures of ribs, left side, initial encounter for closed fracture: Secondary | ICD-10-CM | POA: Diagnosis not present

## 2020-04-23 LAB — CBC
HCT: 33.5 % — ABNORMAL LOW (ref 36.0–46.0)
Hemoglobin: 10.8 g/dL — ABNORMAL LOW (ref 12.0–15.0)
MCH: 30.9 pg (ref 26.0–34.0)
MCHC: 32.2 g/dL (ref 30.0–36.0)
MCV: 96 fL (ref 80.0–100.0)
Platelets: 154 10*3/uL (ref 150–400)
RBC: 3.49 MIL/uL — ABNORMAL LOW (ref 3.87–5.11)
RDW: 13.8 % (ref 11.5–15.5)
WBC: 3.8 10*3/uL — ABNORMAL LOW (ref 4.0–10.5)
nRBC: 0 % (ref 0.0–0.2)

## 2020-04-23 LAB — BASIC METABOLIC PANEL
Anion gap: 11 (ref 5–15)
BUN: 35 mg/dL — ABNORMAL HIGH (ref 8–23)
CO2: 24 mmol/L (ref 22–32)
Calcium: 10.3 mg/dL (ref 8.9–10.3)
Chloride: 105 mmol/L (ref 98–111)
Creatinine, Ser: 1.61 mg/dL — ABNORMAL HIGH (ref 0.44–1.00)
GFR, Estimated: 30 mL/min — ABNORMAL LOW (ref >60)
Glucose, Bld: 81 mg/dL (ref 70–99)
Potassium: 4 mmol/L (ref 3.5–5.1)
Sodium: 140 mmol/L (ref 135–145)

## 2020-04-23 MED ORDER — HYDROCODONE-ACETAMINOPHEN 5-325 MG PO TABS: 1.0000 | ORAL_TABLET | Freq: Four times a day (QID) | ORAL | 0 refills | Status: AC | PRN

## 2020-04-23 MED ORDER — MORPHINE SULFATE (PF) 2 MG/ML IV SOLN
2.0000 mg | Freq: Once | INTRAVENOUS | Status: AC
  Administered 2020-04-23: 2 mg via INTRAVENOUS
  Filled 2020-04-23: qty 1

## 2020-04-23 NOTE — Discharge Summary (Signed)
Physician Discharge Summary  [name] IPJ:[id number] DOB: 12-27-1926 DOA: 04/22/2020  PCP: [name], MD  Admit date: 04/22/2020 Discharge date: 04/23/2020  Admitted From: ALF Disposition:  SNF  Recommendations for Outpatient Follow-up:  1. Follow up with PCP in 1-2 weeks 2. Please obtain BMP/CBC in one week   Discharge Condition:stable CODE STATUS:full code Diet recommendation: heart healthy  Brief/Interim Summary: 87 year old female with history of hypertension, who is a resident of an assisted living facility, brought to the hospital after having a fall.  She complained of back pain.  She was found to have left-sided rib fractures.  She was noted to have mild acute kidney injury.  She was admitted for further IV fluids and PT evaluation.  Discharge Diagnoses:  Principal Problem:   Fall Active Problems:   Essential hypertension with goal blood pressure less than 130/80   Multiple left rib fractures  Fall with multiple left rib fractures -Likely occurred in the setting of dehydration -Continue supportive management with pain management and physical therapy  Acute kidney injury -Baseline creatinine is not clear -Creatinine on admission 1.7 -She was taking diuretics as well as a ARB, this is been held -She received IV fluids -Creatinine has been stable -We will need repeat BMP in 1 week  Hypertension -Holding ARB, diuretics, Norvasc for marginal blood pressures -Continued on low-dose Toprol  Depression/anxiety -Continue on Lexapro  Discharge Instructions  Discharge Instructions    Diet - low sodium heart healthy   Complete by: As directed    Increase activity slowly   Complete by: As directed      Allergies as of 04/23/2020      Reactions   Prednisone Swelling   Sulfa Antibiotics Swelling   Latex    Lidoderm [lidocaine]    Caused rash--question allergy to adhesive      Medication List    STOP taking these medications   amLODipine 5 MG  tablet Commonly known as: NORVASC   baclofen 10 MG tablet Commonly known as: LIORESAL   hydrochlorothiazide 25 MG tablet Commonly known as: HYDRODIURIL   irbesartan 150 MG tablet Commonly known as: AVAPRO     TAKE these medications   acetaminophen 325 MG tablet Commonly known as: TYLENOL Take 650 mg by mouth every 6 (six) hours as needed for mild pain or headache.   betamethasone dipropionate 0.05 % lotion Apply 1 application topically daily.   donepezil 5 MG tablet Commonly known as: ARICEPT Take 5 mg by mouth at bedtime.   escitalopram 10 MG tablet Commonly known as: LEXAPRO Take 10 mg by mouth daily.   feeding supplement Liqd Take 237 mLs by mouth daily at 3 pm.   fenofibrate 145 MG tablet Commonly known as: TRICOR Take 145 mg by mouth daily.   HYDROcodone-acetaminophen 5-325 MG tablet Commonly known as: NORCO/VICODIN Take 1 tablet by mouth every 6 (six) hours as needed for moderate pain or severe pain.   metoprolol succinate 25 MG 24 hr tablet Commonly known as: TOPROL-XL Take 0.5 tablets (12.5 mg total) by mouth daily.   MULTIVITAMIN ADULTS PO Take 1 tablet by mouth daily.   Muscle Rub 10-15 % Crea Apply 1 application topically 3 (three) times daily before meals. To back muscles       Contact information for after-discharge care    Destination    [facility] .   Service: Skilled Nursing Contact information: [address] [place] [phone]  Allergies  Allergen Reactions  . Prednisone Swelling  . Sulfa Antibiotics Swelling  . Latex   . Lidoderm [Lidocaine]     Caused rash--question allergy to adhesive    Consultations:     Procedures/Studies: CT ABDOMEN PELVIS WO CONTRAST  Result Date: 04/22/2020 CLINICAL DATA:  LEFT flank ecchymosis post fall 2 days ago. EXAM: CT ABDOMEN AND PELVIS WITHOUT CONTRAST TECHNIQUE: Multidetector CT  imaging of the abdomen and pelvis was performed following the standard protocol without IV contrast. COMPARISON:  March 20, 2015 FINDINGS: Lower chest: Basilar atelectasis. Basilar scarring, similar to the prior study. Heart size stable, incompletely imaged however with signs of coronary artery calcification. Hepatobiliary: Liver contour is smooth. No perihepatic fluid. Cholelithiasis. No pericholecystic stranding. No gross biliary duct distension but with filling defect at the level of the distal common bile duct compatible with calculus on image 27 of series 2) slight increase in biliary duct dilation previously approximately 7 mm as measured on CT now approximately 8 mm, not well evaluated currently. Pancreas: Normal pancreas in terms of contour. No peripancreatic inflammation with signs of mild pancreatic atrophy. Spleen: Spleen is normal. Adrenals/Urinary Tract: Adrenal glands are normal. Mild to moderate cortical parenchymal loss of the bilateral kidneys. Low-density exophytic lesion in the upper pole likely a cyst. Other areas described on prior imaging studies not well seen in the absence of contrast. No nephrolithiasis. No perinephric stranding. Is interval migration of a small LEFT-sided calculus into the proximal LEFT ureter. 5 mm calculus on image 58 of series 5. Another calcification near the ovary is compatible with a small phlebolith. Urinary bladder with smooth contour. No RIGHT-sided ureteral calculus. Stomach/Bowel: No acute gastrointestinal process. Signs of ventral abdominal wall reconstruction with rectus diastasis but without frank herniation of bowel. Vascular/Lymphatic: Calcified atheromatous plaque of the abdominal aorta. Stable dilation of the infrarenal abdominal aorta up to 2.8 cm. There is however increasing dilation of a distal thoracic aortic aneurysm, fusiform dilation that involves the distal thoracic aorta above the aortic hiatus now proximally 4.7 cm greatest AP dimension  previously approximately 4.1 cm. Stable peripherally calcified splenic artery aneurysm at 15 mm. There is no gastrohepatic or hepatoduodenal ligament lymphadenopathy. No retroperitoneal or mesenteric lymphadenopathy. No pelvic sidewall lymphadenopathy. Uterus and adnexa unremarkable by CT. Reproductive: Unremarkable appearance of reproductive structures. Other: No ascites.  No free air. Musculoskeletal: LEFT flank stranding, associated with LEFT-sided rib fractures of LEFT ribs 9, 10 and 11 posteriorly with mild displacement of tenth and eleventh ribs. Chronic compression fractures in the lower thoracic and upper lumbar spine with similar appearance with changes of cement augmentation at T9 as before. See dedicated lumbar spine CT for further detail. Post RIGHT hip arthroplasty and LEFT femoral ORIF. IMPRESSION: 1. LEFT proximal to mid ureteral calculus without hydronephrosis, 5 mm calculus. 2. Choledocholithiasis with slight increase in biliary duct caliber but without pericholecystic stranding. Correlate with any RIGHT upper quadrant pain in this patient who also displays numerous layering calculi in the lumen of the gallbladder. 3. LEFT flank stranding associated with LEFT-sided rib fractures of LEFT ribs 9, 10 and 11 posteriorly with mild displacement of tenth and eleventh ribs. 4. Increasing dilation of the distal thoracic aorta now 4.8 cm greatest axial dimension, increased compared to 4.3 cm on January of 2017. Sagittal mention approximately 4.7 cm as compared to 4.2 cm on the prior study. Given this enlargement now near 5 cm vascular surgical consultation is suggested with six-month follow-up or as clinically warranted with CT for further evaluation.  5. Stable dilation of the infrarenal abdominal aorta up to 2.8 cm. 6. Stable peripherally calcified splenic artery aneurysm at 15 mm. 7. Aortic atherosclerosis. Aortic Atherosclerosis (ICD10-I70.0). Electronically Signed   By: Zetta Bills M.D.   On:  04/22/2020 13:46   DG Ribs Unilateral W/Chest Left  Result Date: 04/22/2020 CLINICAL DATA:  [name] on Saturday.  Left rib pain. EXAM: LEFT RIBS AND CHEST - 3+ VIEW COMPARISON:  Chest x-ray 07/13/2019 and chest CT 02/10/2019 FINDINGS: The cardiac silhouette, mediastinal and hilar contours are stable. Stable tortuosity and calcification of the thoracic aorta. No acute pulmonary findings.  No pneumothorax or pleural effusion. Remote healed appearing rib fractures are noted. Suspect acute rib fractures also. This appears to involve the seventh and eighth ribs. No pneumothorax or pleural effusion. IMPRESSION: 1. Suspect acute rib fractures involving the seventh and eighth ribs. 2. Remote healed rib fractures are also noted. 3. No pneumothorax or pleural effusion. Electronically Signed   By: Marijo Sanes M.D.   On: 04/22/2020 13:48   CT Head Wo Contrast  Result Date: 04/22/2020 CLINICAL DATA:  Fall. EXAM: CT HEAD WITHOUT CONTRAST CT CERVICAL SPINE WITHOUT CONTRAST TECHNIQUE: Multidetector CT imaging of the head and cervical spine was performed following the standard protocol without intravenous contrast. Multiplanar CT image reconstructions of the cervical spine were also generated. COMPARISON:  CT head 07/13/2019 FINDINGS: CT HEAD FINDINGS Brain: Generalized atrophy. Mild white matter hypodensity bilaterally, unchanged. Negative for acute infarct, hemorrhage, mass. Vascular: Negative for hyperdense vessel. Atherosclerotic calcification in the carotid artery bilaterally. Skull: Negative for skull fracture. Sinuses/Orbits: Mild mucosal edema paranasal sinuses. Bilateral cataract extraction Other: None CT CERVICAL SPINE FINDINGS Alignment: Mild anterolisthesis C4-5 and C5-6. Skull base and vertebrae: Negative for fracture. Soft tissues and spinal canal: Multiple right thyroid nodules. The largest nodule 2.4 cm. Multiple right thyroid calcifications. Hypoplastic left lobe of the thyroid. Disc levels: Multilevel disc and  facet degeneration in the cervical spine. No significant stenosis. Upper chest: Negative Other: None IMPRESSION: 1. No acute intracranial abnormality. Chronic atrophy and chronic white matter ischemia 2. Negative for cervical spine fracture 3. Right thyroid nodule up to 2.5 cm in diameter. Recommend thyroid ultrasound. (Ref: J Am Coll Radiol. 2015 Feb;12(2): 143-50). Electronically Signed   By: Franchot Gallo M.D.   On: 04/22/2020 13:38   CT Chest Wo Contrast  Result Date: 04/22/2020 CLINICAL DATA:  [name].  Rib fractures. EXAM: CT CHEST WITHOUT CONTRAST TECHNIQUE: Multidetector CT imaging of the chest was performed following the standard protocol without IV contrast. COMPARISON:  Radiographs, same date. FINDINGS: Cardiovascular: The heart is within normal limits in size for age. No pericardial effusion. There is tortuosity, ectasia and calcification of the thoracic aorta. 4.5 cm aneurysm noted along the proximal aortic arch. This is unchanged since 2020. There is also an aneurysm involving the distal thoracic aorta near its junction with the abdominal aorta. This measured a maximum of 4.8 cm and is also unchanged. Stable three-vessel coronary artery calcifications. Mediastinum/Nodes: Stable right-sided multinodular thyroid goiter. In the setting of significant comorbidities or limited life expectancy, no follow-up recommended (ref: J Am Coll Radiol. 2015 Feb;12(2): 143-50). Stable scattered mediastinal and hilar lymph nodes. No mass or overt adenopathy. The esophagus is grossly normal. Lungs/Pleura: Mild emphysematous changes and chronic bronchitic changes but no infiltrates or worrisome pulmonary lesions. Minimal dependent bibasilar atelectasis. Stable mild eventration of the left hemidiaphragm. Scattered branching radiodensities and both lungs likely related to prior vertebroplasty with embolization of some of the bone cement.  Upper Abdomen: No significant upper abdominal findings. There is a stable 18 mm  calcified splenic artery aneurysm noted. Musculoskeletal: There are bilateral healed rib fractures mainly anteriorly. There are also acute fractures eighth, tenth and eleventh left posterior ribs. Vertebral augmentation changes are noted and L1 and L2 compression fractures. IMPRESSION: 1. Stable 4.5 cm aneurysm along the proximal aortic arch and 4.8 cm aneurysm involving the distal thoracic aorta near its junction with the abdominal aorta. Given the patient's age and stability no further imaging evaluation is necessary. 2. Stable bilateral healed rib fractures. 3. Acute eighth, tenth and eleventh left posterior rib fractures. 4. Stable branching radiodensities in both lungs likely related to prior vertebroplasty with embolization of some of the bone cement. 5. Mild emphysematous changes and chronic bronchitic changes but no infiltrates or worrisome pulmonary lesions. Aortic Atherosclerosis (ICD10-I70.0) and Emphysema (ICD10-J43.9). Electronically Signed   By: Marijo Sanes M.D.   On: 04/22/2020 16:51   CT Cervical Spine Wo Contrast  Result Date: 04/22/2020 CLINICAL DATA:  Fall. EXAM: CT HEAD WITHOUT CONTRAST CT CERVICAL SPINE WITHOUT CONTRAST TECHNIQUE: Multidetector CT imaging of the head and cervical spine was performed following the standard protocol without intravenous contrast. Multiplanar CT image reconstructions of the cervical spine were also generated. COMPARISON:  CT head 07/13/2019 FINDINGS: CT HEAD FINDINGS Brain: Generalized atrophy. Mild white matter hypodensity bilaterally, unchanged. Negative for acute infarct, hemorrhage, mass. Vascular: Negative for hyperdense vessel. Atherosclerotic calcification in the carotid artery bilaterally. Skull: Negative for skull fracture. Sinuses/Orbits: Mild mucosal edema paranasal sinuses. Bilateral cataract extraction Other: None CT CERVICAL SPINE FINDINGS Alignment: Mild anterolisthesis C4-5 and C5-6. Skull base and vertebrae: Negative for fracture. Soft tissues  and spinal canal: Multiple right thyroid nodules. The largest nodule 2.4 cm. Multiple right thyroid calcifications. Hypoplastic left lobe of the thyroid. Disc levels: Multilevel disc and facet degeneration in the cervical spine. No significant stenosis. Upper chest: Negative Other: None IMPRESSION: 1. No acute intracranial abnormality. Chronic atrophy and chronic white matter ischemia 2. Negative for cervical spine fracture 3. Right thyroid nodule up to 2.5 cm in diameter. Recommend thyroid ultrasound. (Ref: J Am Coll Radiol. 2015 Feb;12(2): 143-50). Electronically Signed   By: Franchot Gallo M.D.   On: 04/22/2020 13:38   CT L-SPINE NO CHARGE  Result Date: 04/22/2020 CLINICAL DATA:  Fall.  Low back pain. EXAM: CT LUMBAR SPINE WITHOUT CONTRAST TECHNIQUE: Multidetector CT imaging of the lumbar spine was performed without intravenous contrast administration. Multiplanar CT image reconstructions were also generated. COMPARISON:  Lumbar MRI 12/19/2006.  CT abdomen pelvis 03/21/2011 FINDINGS: Segmentation: S1 is largely lumbarized. Alignment: Normal Vertebrae: Severe compression fracture T12 unchanged from prior study. Mild bony retropulsion without stenosis. Severe compression fracture L1 not seen on prior studies but appears chronic. Mild bony retropulsion without stenosis. No other fracture or mass lesion. Paraspinal and other soft tissues: Atherosclerotic calcification abdominal aorta and iliac arteries. Aneurysm of the lower thoracic aorta at the region of the diaphragmatic hiatus. Aneurysmal dilatation 4.9 cm, previously 3.3 cm in 2013. Disc levels: T11-12: Disc degeneration. Posterior retropulsion of bone related to T12 fracture without significant stenosis T12-L1: Mild disc degeneration. Mild bony retropulsion of L1 fracture without significant spinal stenosis. L1-2: Mild disc degeneration and spurring.  No significant stenosis L2-3: Mild disc degeneration and disc bulging without stenosis L3-4: Mild disc  bulging and spurring.  Negative for stenosis L4-5: Mild disc and facet degeneration.  Negative for stenosis L5-S1: Moderate facet degeneration.  Negative for stenosis. IMPRESSION: 1. S1 is largely  lumbarized 2. Severe compression fracture T12 and L1 appear chronic. No acute fracture 3. Multilevel degenerative change without significant spinal stenosis 4. Aneurysmal dilatation distal thoracic aorta 4.9 cm. Previously 3.3 cm 2013 Recommend follow-up CT/MR every 6 months and vascular consultation. This recommendation follows ACR consensus guidelines: White Paper of the ACR Incidental Findings Committee II on Vascular Findings. J Am Coll Radiol 2013; 10:789-794. Electronically Signed   By: Franchot Gallo M.D.   On: 04/22/2020 13:32       Subjective: She has some pain in her left ribs/back, but appears comfortable at rest. No shortness of breath  Discharge Exam: Vitals:   04/22/20 2300 04/23/20 0127 04/23/20 0515 04/23/20 0834  BP: (!) 115/57 (!) 100/50 (!) 114/54 (!) 109/55  Pulse: 65 65 60 69  Resp: 20 20 20    Temp: 98.2 F (36.8 C) 97.8 F (36.6 C) (!) 97.4 F (36.3 C)   TempSrc: Oral Oral Oral   SpO2: 97% 98% 99%   Weight:      Height:        General: Pt is alert, awake, not in acute distress Cardiovascular: RRR, S1/S2 +, no rubs, no gallops Respiratory: CTA bilaterally, no wheezing, no rhonchi, tenderness to palpation over left chest and movement of left arm Abdominal: Soft, NT, ND, bowel sounds + Extremities: no edema, no cyanosis    The results of significant diagnostics from this hospitalization (including imaging, microbiology, ancillary and laboratory) are listed below for reference.     Microbiology: Recent Results (from the past 240 hour(s))  SARS CORONAVIRUS 2 (TAT 6-24 HRS) Nasopharyngeal Nasopharyngeal Swab     Status: None   Collection Time: 04/22/20  1:54 PM   Specimen: Nasopharyngeal Swab  Result Value Ref Range Status   SARS Coronavirus 2 NEGATIVE NEGATIVE  Final    Comment: (NOTE) SARS-CoV-2 target nucleic acids are NOT DETECTED.  The SARS-CoV-2 RNA is generally detectable in upper and lower respiratory specimens during the acute phase of infection. Negative results do not preclude SARS-CoV-2 infection, do not rule out co-infections with other pathogens, and should not be used as the sole basis for treatment or other patient management decisions. Negative results must be combined with clinical observations, patient history, and epidemiological information. The expected result is Negative.  Fact Sheet for Patients: SugarRoll.be  Fact Sheet for Healthcare Providers: https://www.woods-mathews.com/  This test is not yet approved or cleared by the [place] FDA and  has been authorized for detection and/or diagnosis of SARS-CoV-2 by FDA under an Emergency Use Authorization (EUA). This EUA will remain  in effect (meaning this test can be used) for the duration of the COVID-19 declaration under Se ction 564(b)(1) of the Act, 21 U.S.C. section 360bbb-3(b)(1), unless the authorization is terminated or revoked sooner.  Performed at [facility], [place] [address] [place], [place] [address]      Labs: BNP (last 3 results) No results for input(s): BNP in the last 8760 hours. Basic Metabolic Panel: Recent Labs  Lab 04/22/20 1208 04/23/20 0444  NA 134* 140  K 4.2 4.0  CL 102 105  CO2 24 24  GLUCOSE 98 81  BUN 38* 35*  CREATININE 1.70* 1.61*  CALCIUM 10.6* 10.3   Liver Function Tests: No results for input(s): AST, ALT, ALKPHOS, BILITOT, PROT, ALBUMIN in the last 168 hours. No results for input(s): LIPASE, AMYLASE in the last 168 hours. No results for input(s): AMMONIA in the last 168 hours. CBC: Recent Labs  Lab 04/22/20 1208 04/23/20 0444  WBC 3.9* 3.8*  NEUTROABS 2.7  --   HGB 11.4* 10.8*  HCT 35.1* 33.5*  MCV 93.4 96.0  PLT 166 154   Cardiac Enzymes: Recent Labs  Lab  04/22/20 1208  CKTOTAL 78   BNP: Invalid input(s): POCBNP CBG: No results for input(s): GLUCAP in the last 168 hours. D-Dimer No results for input(s): DDIMER in the last 72 hours. Hgb A1c No results for input(s): HGBA1C in the last 72 hours. Lipid Profile No results for input(s): CHOL, HDL, LDLCALC, TRIG, CHOLHDL, LDLDIRECT in the last 72 hours. Thyroid function studies No results for input(s): TSH, T4TOTAL, T3FREE, THYROIDAB in the last 72 hours.  Invalid input(s): FREET3 Anemia work up No results for input(s): VITAMINB12, FOLATE, FERRITIN, TIBC, IRON, RETICCTPCT in the last 72 hours. Urinalysis    Component Value Date/Time   COLORURINE YELLOW 04/22/2020 1240   APPEARANCEUR HAZY (A) 04/22/2020 1240   LABSPEC 1.014 04/22/2020 1240   PHURINE 7.0 04/22/2020 1240   GLUCOSEU NEGATIVE 04/22/2020 1240   HGBUR NEGATIVE 04/22/2020 1240   BILIRUBINUR NEGATIVE 04/22/2020 1240   KETONESUR NEGATIVE 04/22/2020 1240   PROTEINUR NEGATIVE 04/22/2020 1240   UROBILINOGEN 0.2 03/20/2011 2205   NITRITE NEGATIVE 04/22/2020 1240   LEUKOCYTESUR TRACE (A) 04/22/2020 1240   Sepsis Labs Invalid input(s): PROCALCITONIN,  WBC,  LACTICIDVEN Microbiology Recent Results (from the past 240 hour(s))  SARS CORONAVIRUS 2 (TAT 6-24 HRS) Nasopharyngeal Nasopharyngeal Swab     Status: None   Collection Time: 04/22/20  1:54 PM   Specimen: Nasopharyngeal Swab  Result Value Ref Range Status   SARS Coronavirus 2 NEGATIVE NEGATIVE Final    Comment: (NOTE) SARS-CoV-2 target nucleic acids are NOT DETECTED.  The SARS-CoV-2 RNA is generally detectable in upper and lower respiratory specimens during the acute phase of infection. Negative results do not preclude SARS-CoV-2 infection, do not rule out co-infections with other pathogens, and should not be used as the sole basis for treatment or other patient management decisions. Negative results must be combined with clinical observations, patient history, and  epidemiological information. The expected result is Negative.  Fact Sheet for Patients: SugarRoll.be  Fact Sheet for Healthcare Providers: https://www.woods-mathews.com/  This test is not yet approved or cleared by the [place] FDA and  has been authorized for detection and/or diagnosis of SARS-CoV-2 by FDA under an Emergency Use Authorization (EUA). This EUA will remain  in effect (meaning this test can be used) for the duration of the COVID-19 declaration under Se ction 564(b)(1) of the Act, 21 U.S.C. section 360bbb-3(b)(1), unless the authorization is terminated or revoked sooner.  Performed at [facility], [place] [address], [place], [place] [address]      Time coordinating discharge: 25mins  SIGNED:   Kathie Dike, MD  [facility] 04/23/2020, 12:19 PM   If 7PM-7AM, please contact night-coverage [email or web address]

## 2020-04-23 NOTE — TOC Transition Note (Signed)
Transition of Care [facility]) - CM/SW Discharge Note   Patient Details  Name: [name] MRN: [id number] Date of Birth: October 15, 1926  [facility]) CM/SW Contact:  Boneta Lucks, RN Phone Number: 04/23/2020, 12:44 PM   Clinical Narrative:   Patient medically ready for discharge. Daughter accepted the bed offer at [facility] - their first choice. Medicare OBS is waived. Sent DC summary to [employer]. RN called report. Medical necessity printed and EMS scheduled.     Final next level of care: Skilled Nursing Facility Barriers to Discharge: Barriers Resolved   Patient Goals and CMS Choice Patient states their goals for this hospitalization and ongoing recovery are:: SNF CMS Medicare.gov Compare Post Acute Care list provided to:: Patient Represenative (must comment) Choice offered to / list presented to : Adult Children  Discharge Placement              Patient chooses bed at: [facility]) Patient to be transferred to facility by: EMS Name of family member notified: [name] - Daughter Patient and family notified of of transfer: 04/23/20  Discharge Plan and Services In-house Referral: Clinical Social Work   Post Acute Care Choice: [place]          DME Arranged: N/A

## 2020-04-23 NOTE — Evaluation (Signed)
Physical Therapy Evaluation Patient Details Name: [name] MRN: [id number] DOB: [birth year] Today's Date: 04/23/2020   History of Present Illness  [name] is a 87 y.o. female with medical history significant ot HTN, AAA with repair in 2011, gout, L breast cancer, currently reports a fall and c/o lower back and left flank pain; found to have posterior L 8th, 10th and 11th rib fractures.  Clinical Impression  Pt admitted with above diagnosis. Unsure of pt's PLOF due to pleasantly confused during eval. Pt currently requiring max A to sit at EOB and total assist to scoot up in bed and reposition in supine. Pt unable to take steps or stand due to high L back pain at rib fracture sites. Pt tolerates sitting EOB for ~3 minutes before requesting to return to supine. Recommending OT eval due to BUE weakness, LUE more limited due to high pain on rib fracture side. Pt currently with functional limitations due to the deficits listed below (see PT Problem List). Pt will benefit from skilled PT to increase their independence and safety with mobility to allow discharge to the venue listed below.       Follow Up Recommendations SNF    Equipment Recommendations  None recommended by PT    Recommendations for Other Services OT consult     Precautions / Restrictions Precautions Precautions: Fall Precaution Comments: L posterior rib fx 8, 10, 11 Restrictions Weight Bearing Restrictions: No      Mobility  Bed Mobility Overal bed mobility: Needs Assistance Bed Mobility: Supine to Sit;Sit to Supine  Supine to sit: Max assist Sit to supine: Min assist   General bed mobility comments: max A to upright trunk into sitting with use of bedpad, LUE limited due to high pain, min A to lift BLE back into bed; total assist with bed assist and bed pad to scoot up in bed and reposition in supine    Transfers  General transfer comment: unable due to pain  Ambulation/Gait  General Gait  Details: unable due to pain  Stairs            Wheelchair Mobility    Modified Rankin (Stroke Patients Only)       Balance Overall balance assessment: Needs assistance Sitting-balance support: Feet supported;Bilateral upper extremity supported Sitting balance-Leahy Scale: Poor Sitting balance - Comments: reliant on UE support to maintain static sitting EOB  Standing balance comment: unable        Pertinent Vitals/Pain Pain Assessment: Faces Faces Pain Scale: Hurts whole lot Pain Location: L rib fracture sites Pain Descriptors / Indicators: Aching;Sore Pain Intervention(s): Limited activity within patient's tolerance;Monitored during session;Repositioned    Home Living Family/patient expects to be discharged to:: Assisted living               Home Equipment: Walker - 4 wheels      Prior Function Level of Independence: Independent with assistive device(s)         Comments: Pt reports being independent with ADLs, uses rollator walker for ambulation, but unsure how reliable due to pt pleasantly confused during eval     Hand Dominance        Extremity/Trunk Assessment   Upper Extremity Assessment Upper Extremity Assessment: Generalized weakness (L more limited due to pain)    Lower Extremity Assessment Lower Extremity Assessment: Generalized weakness (AROM ~50 %, strength 2+/5 throughout BLE, reports numbness/tingling in bil feet)    Cervical / Trunk Assessment Cervical / Trunk Assessment: Kyphotic  Communication  Communication: No difficulties  Cognition Arousal/Alertness: Awake/alert Behavior During Therapy: WFL for tasks assessed/performed Overall Cognitive Status: No family/caregiver present to determine baseline cognitive functioning  General Comments: Pt oriented to self only, pleasantly confused and follows commands appropriately.      General Comments      Exercises     Assessment/Plan    PT Assessment Patient needs continued PT  services  PT Problem List Decreased strength;Decreased range of motion;Decreased activity tolerance;Decreased balance;Decreased mobility;Decreased cognition;Decreased knowledge of use of DME;Decreased safety awareness;Cardiopulmonary status limiting activity;Impaired sensation;Obesity;Pain       PT Treatment Interventions DME instruction;Gait training;Functional mobility training;Therapeutic activities;Therapeutic exercise;Balance training;Cognitive remediation;Patient/family education    PT Goals (Current goals can be found in the Care Plan section)  Acute Rehab PT Goals Patient Stated Goal: rehab then ALF PT Goal Formulation: With patient Time For Goal Achievement: 05/07/20 Potential to Achieve Goals: Fair    Frequency Min 2X/week   Barriers to discharge        Co-evaluation               AM-PAC PT "6 Clicks" Mobility  Outcome Measure Help needed turning from your back to your side while in a flat bed without using bedrails?: Total Help needed moving from lying on your back to sitting on the side of a flat bed without using bedrails?: Total Help needed moving to and from a bed to a chair (including a wheelchair)?: Total Help needed standing up from a chair using your arms (e.g., wheelchair or bedside chair)?: Total Help needed to walk in hospital room?: Total Help needed climbing 3-5 steps with a railing? : Total 6 Click Score: 6    End of Session   Activity Tolerance: Patient limited by pain Patient left: in bed;with call bell/phone within reach;with bed alarm set Nurse Communication: Mobility status PT Visit Diagnosis: Unsteadiness on feet (R26.81);Other abnormalities of gait and mobility (R26.89);Muscle weakness (generalized) (M62.81);History of falling (Z91.81);Pain Pain - Right/Left: Left Pain - part of body:  (back)    Time: 3536-1443 PT Time Calculation (min) (ACUTE ONLY): 22 min   Charges:   PT Evaluation $PT Eval Moderate Complexity: 1 Mod            Tori Jakki Doughty PT, DPT 04/23/20, 9:47 AM
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
## 2020-04-23 NOTE — Progress Notes (Signed)
Report has been given to [facility] at [facility]. EMS is being called to transport patient.

## 2020-04-23 NOTE — Plan of Care (Signed)
  Problem: Acute Rehab PT Goals(only PT should resolve) Goal: Pt Will Go Supine/Side To Sit Outcome: Progressing Flowsheets (Taken 04/23/2020 0951) Pt will go Supine/Side to Sit: with moderate assist Goal: Pt Will Go Sit To Supine/Side Outcome: Progressing Flowsheets (Taken 04/23/2020 0951) Pt will go Sit to Supine/Side: with min guard assist Goal: Patient Will Transfer Sit To/From Stand Outcome: Progressing Flowsheets (Taken 04/23/2020 0951) Patient will transfer sit to/from stand: with minimal assist Goal: Pt Will Transfer Bed To Chair/Chair To Bed Outcome: Progressing Flowsheets (Taken 04/23/2020 0951) Pt will Transfer Bed to Chair/Chair to Bed: with mod assist Goal: Pt Will Ambulate Outcome: Progressing Flowsheets (Taken 04/23/2020 0951) Pt will Ambulate:  15 feet  with moderate assist  with rolling walker   Tori Quanita Barona PT, DPT 04/23/20, 9:51 AM
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
## 2021-03-27 IMAGING — CT CT CERVICAL SPINE W/O CM
4 series · 15 of 33 positions shown, 18 images · non-contrast
Comparison: CT head 07/13/2019

CLINICAL DATA: Fall.

EXAM:
CT HEAD WITHOUT CONTRAST
CT CERVICAL SPINE WITHOUT CONTRAST
TECHNIQUE: Multidetector CT imaging of the head and cervical spine was
performed following the standard protocol without intravenous
contrast. Multiplanar CT image reconstructions of the cervical spine
were also generated.

[Series 4: c spine soft · axial · 0.38mm/px · z∈[-61,-31]mm · 2 of 91 slices shown]
[im 16/91  soft-tissue]
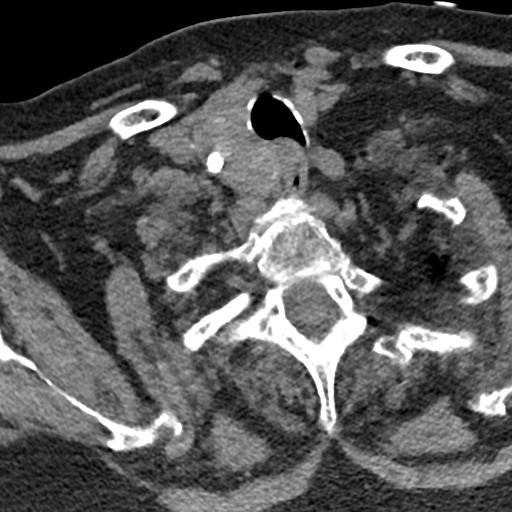
[im 31/91  soft-tissue]
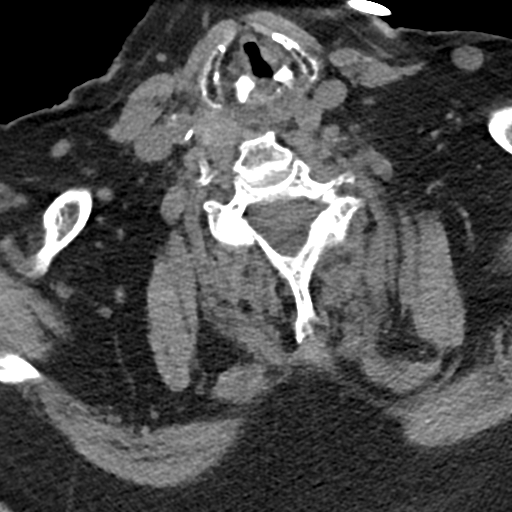

[Series 5: sagittal bone · sagittal · 0.33mm/px · 5 of 61 slices shown, 6 images]
[im 21/61  bone]
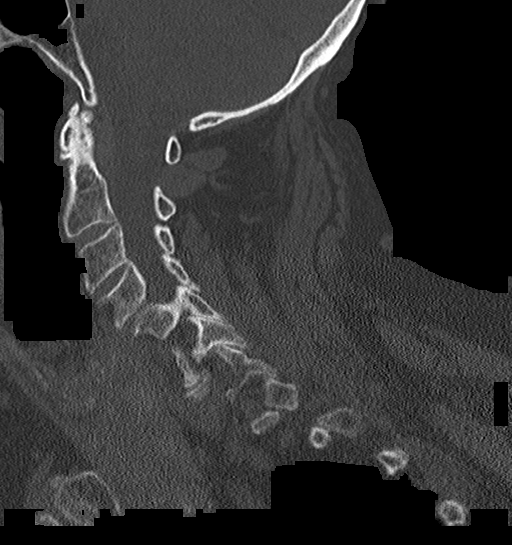
[im 26/61  bone]
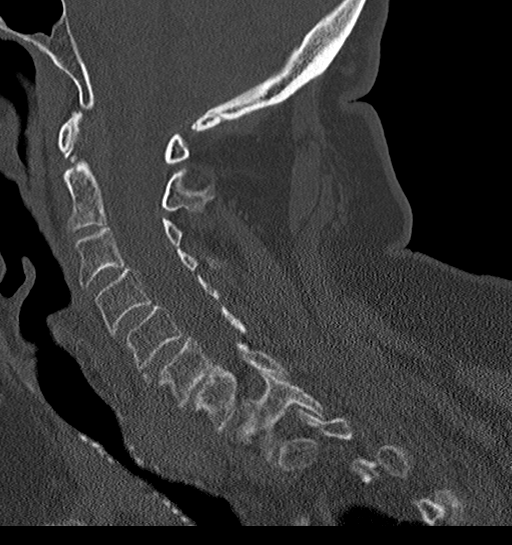
[im 31/61  soft-tissue]
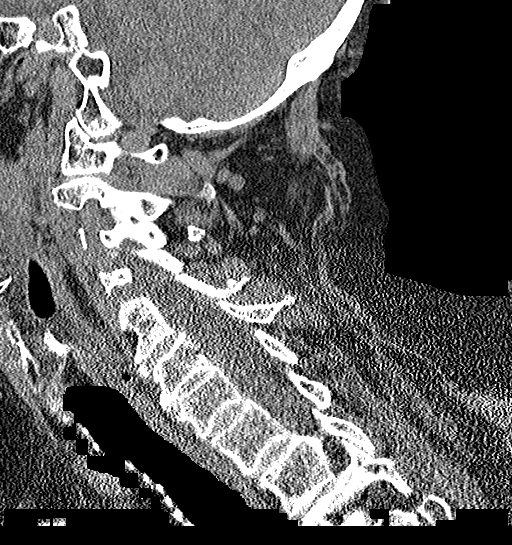
[im 31/61  bone]
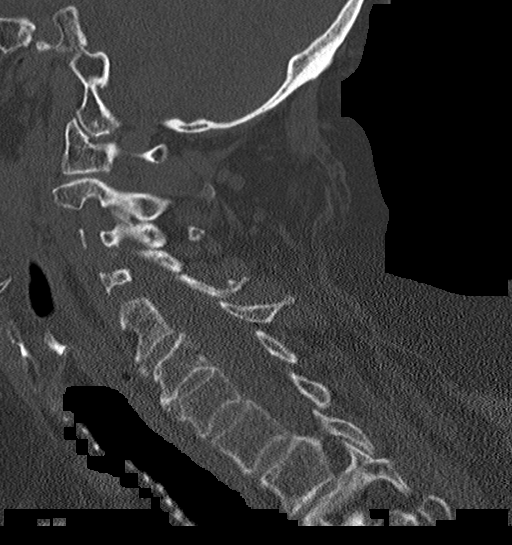
[im 36/61  bone]
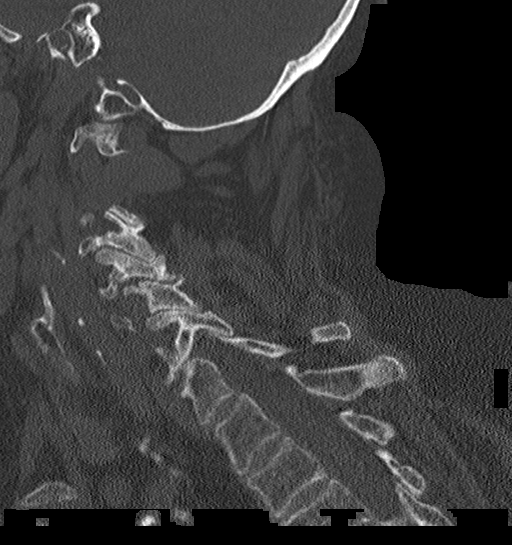
[im 41/61  bone]
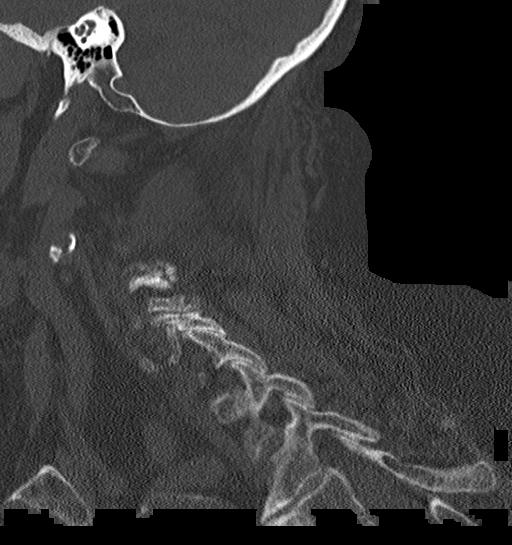

[Series 6: coronal bone · coronal · 0.32mm/px · 3 of 76 slices shown]
[im 26/76  bone]
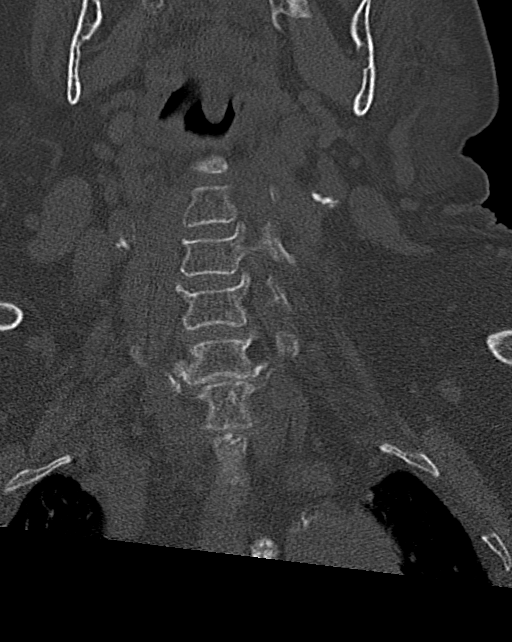
[im 34/76  bone]
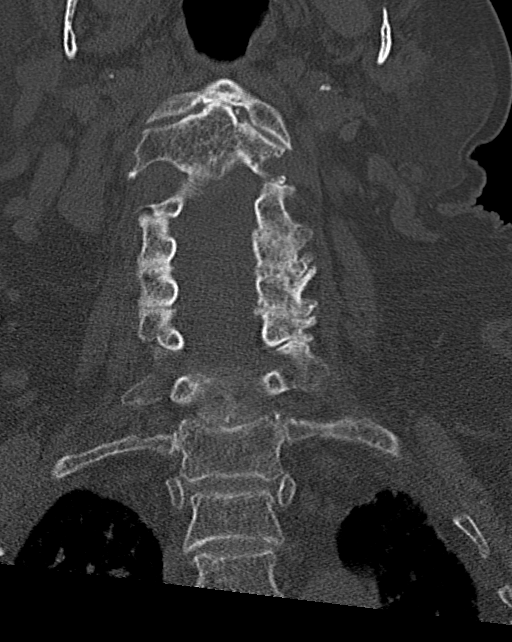
[im 42/76  bone]
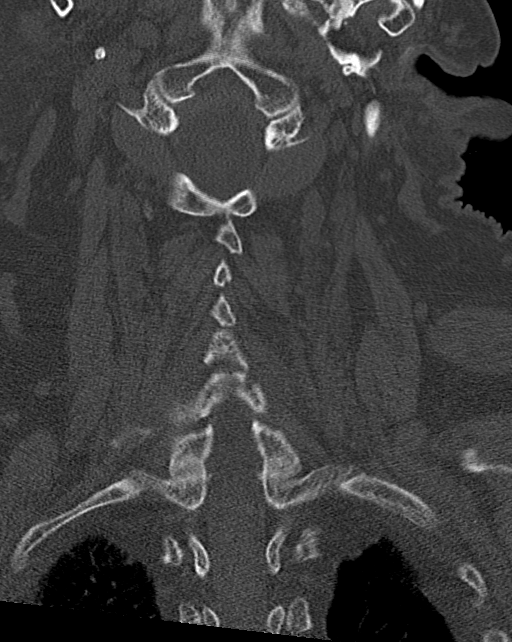

[Series 7: orthogonal axials · axial · 0.21mm/px · z∈[-91,+15]mm · 5 of 93 slices shown, 7 images]
[im 16/93  soft-tissue]
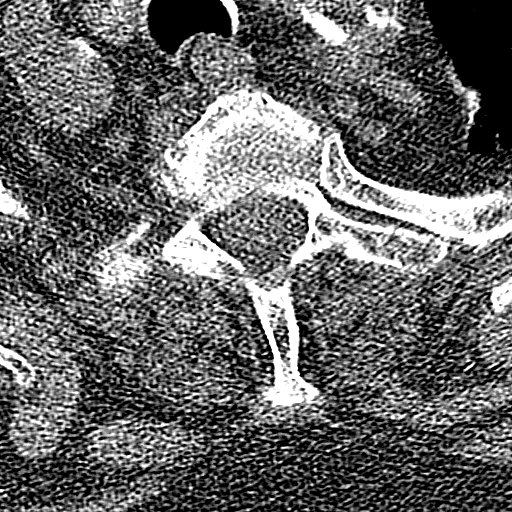
[im 16/93  bone]
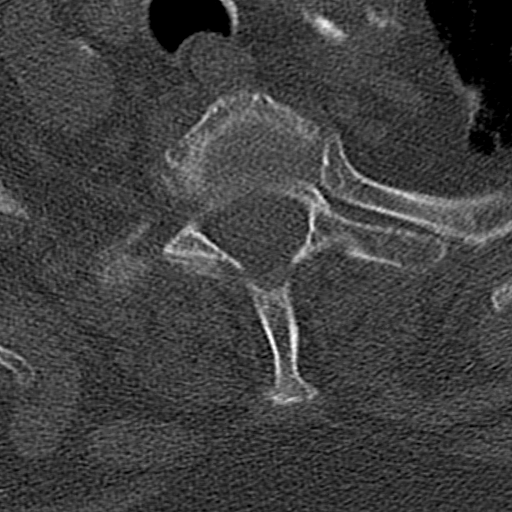
[im 31/93  bone]
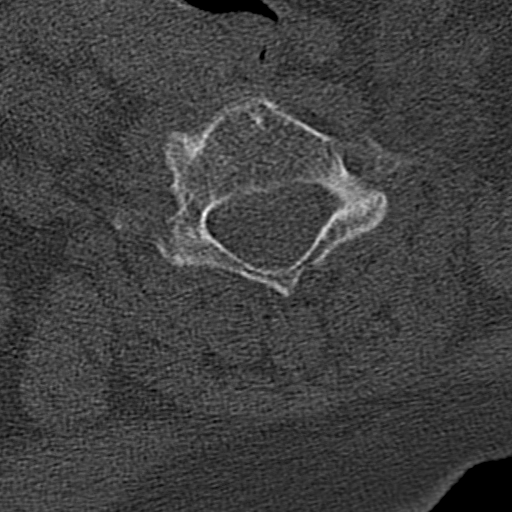
[im 47/93  bone]
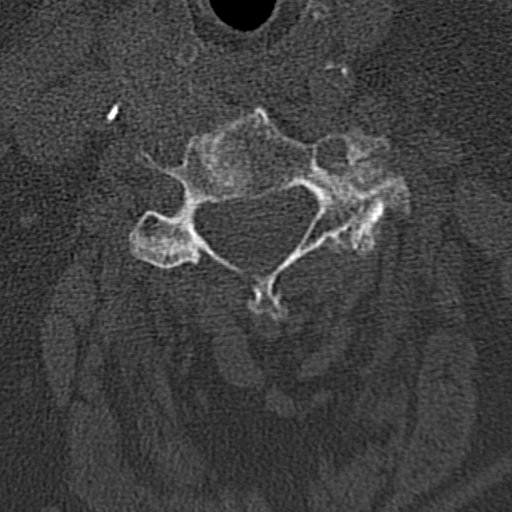
[im 62/93  bone]
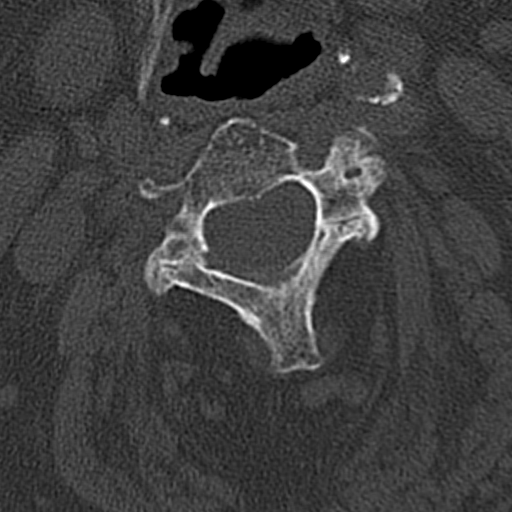
[im 77/93  soft-tissue]
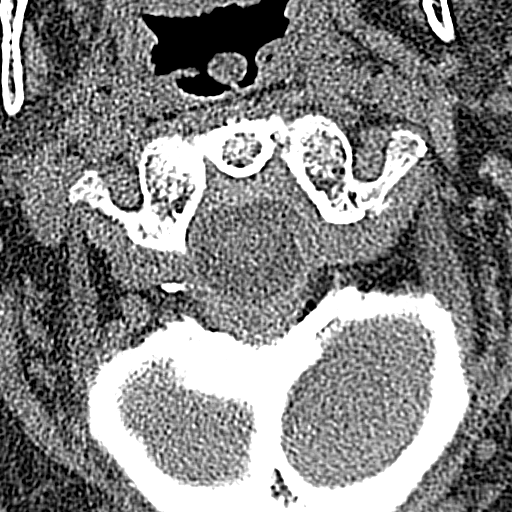
[im 77/93  bone]
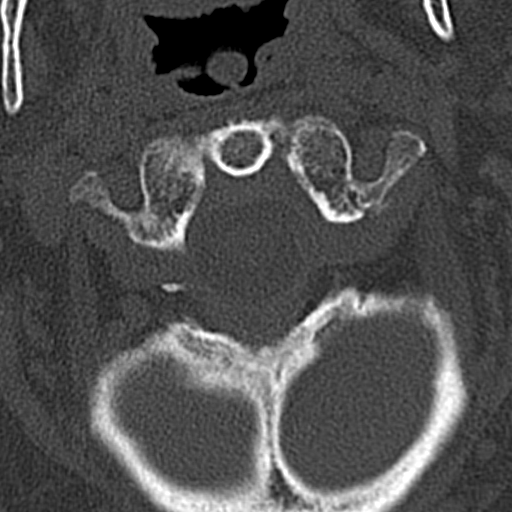

[15 of 33 positions shown; findings below may reference images not displayed]

FINDINGS: CT HEAD FINDINGS

Brain: Generalized atrophy. Mild white matter hypodensity
bilaterally, unchanged.

Negative for acute infarct, hemorrhage, mass.

Vascular: Negative for hyperdense vessel. Atherosclerotic
calcification in the carotid artery bilaterally.

Skull: Negative for skull fracture.

Sinuses/Orbits: Mild mucosal edema paranasal sinuses. Bilateral
cataract extraction

Other: None

CT CERVICAL SPINE FINDINGS

Alignment: Mild anterolisthesis C4-5 and C5-6.

Skull base and vertebrae: Negative for fracture.

Soft tissues and spinal canal: Multiple right thyroid nodules. The
largest nodule 2.4 cm. Multiple right thyroid calcifications.
Hypoplastic left lobe of the thyroid.

Disc levels: Multilevel disc and facet degeneration in the cervical
spine. No significant stenosis.

Upper chest: Negative

Other: None
IMPRESSION: 1. No acute intracranial abnormality. Chronic atrophy and chronic
white matter ischemia
2. Negative for cervical spine fracture
3. Right thyroid nodule up to 2.5 cm in diameter. Recommend thyroid
ultrasound. (Ref: [HOSPITAL]. [DATE]): 143-50).

## 2021-03-27 IMAGING — CT CT HEAD W/O CM
3 series · 15 of 47 positions shown, 18 images · non-contrast
Comparison: CT head 07/13/2019

CLINICAL DATA: Fall.

EXAM:
CT HEAD WITHOUT CONTRAST
CT CERVICAL SPINE WITHOUT CONTRAST
TECHNIQUE: Multidetector CT imaging of the head and cervical spine was
performed following the standard protocol without intravenous
contrast. Multiplanar CT image reconstructions of the cervical spine
were also generated.

[Series 2: head w o · axial · 0.40mm/px · z∈[+66,+201]mm · 9 of 33 slices shown, 12 images]
[im 3/33  brain]
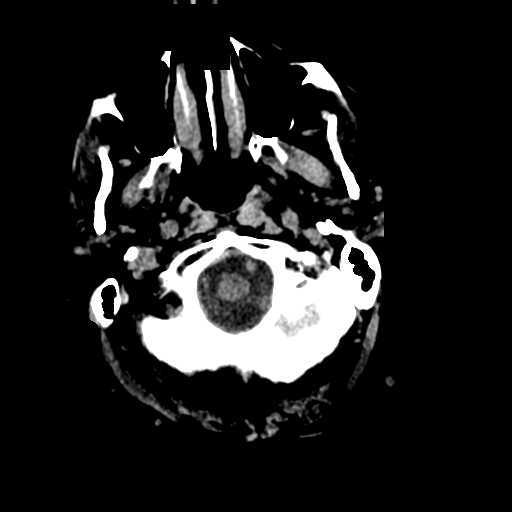
[im 3/33  bone]
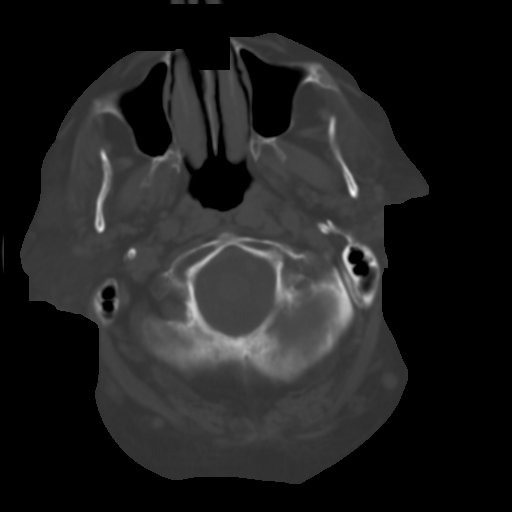
[im 6/33  brain]
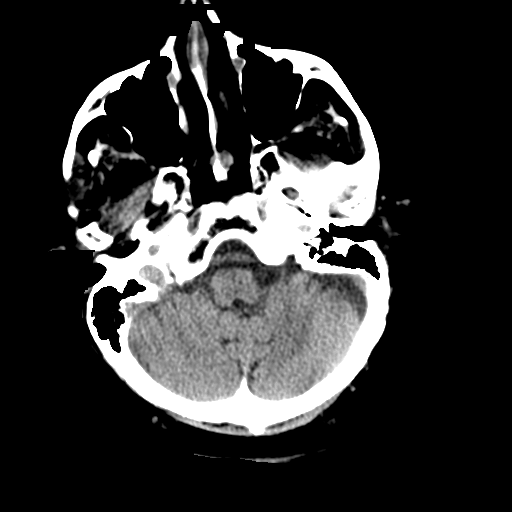
[im 9/33  brain]
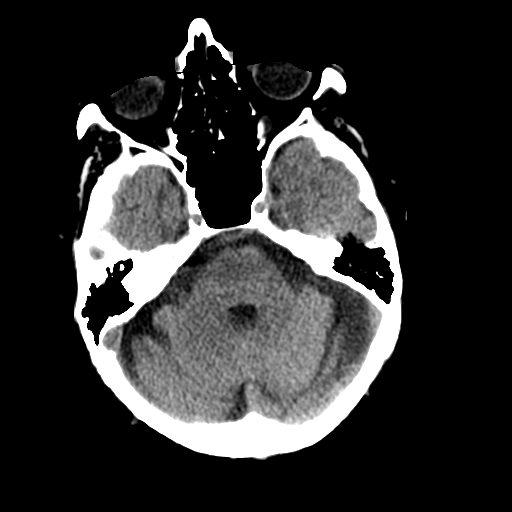
[im 13/33  brain]
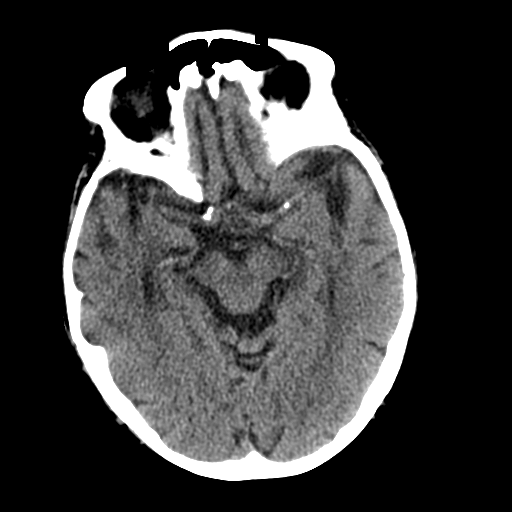
[im 17/33  brain]
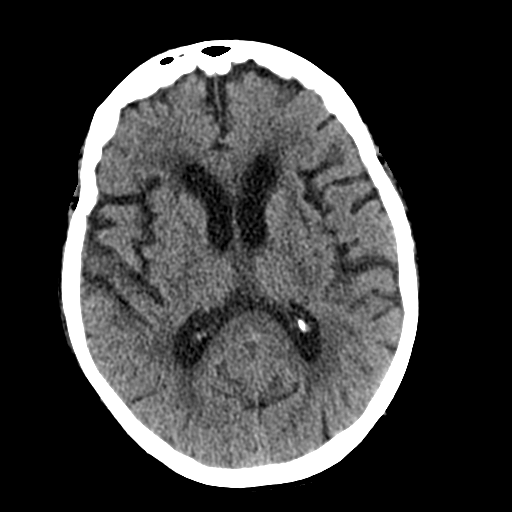
[im 17/33  bone]
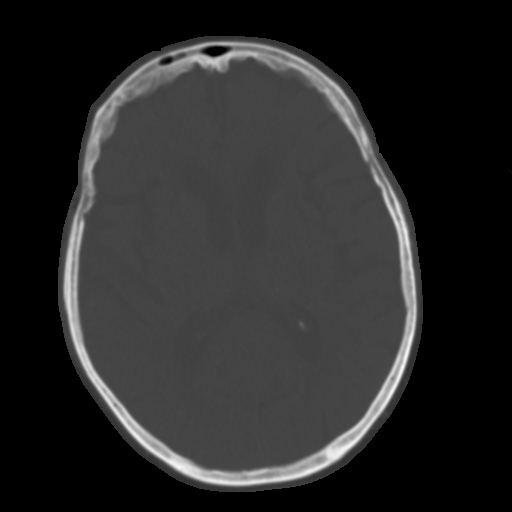
[im 20/33  brain]
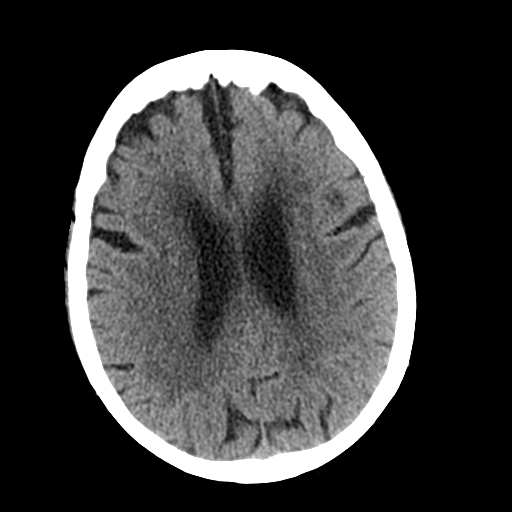
[im 24/33  brain]
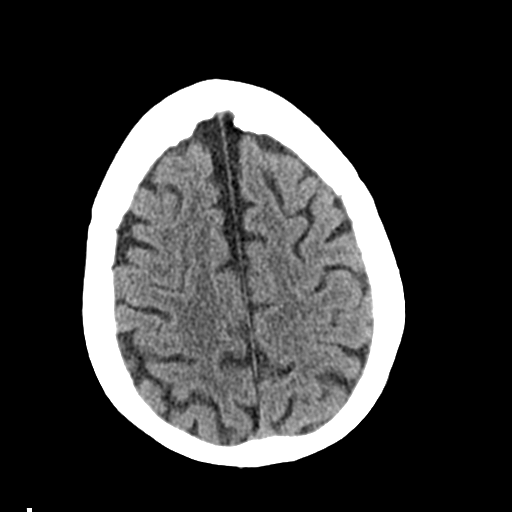
[im 27/33  brain]
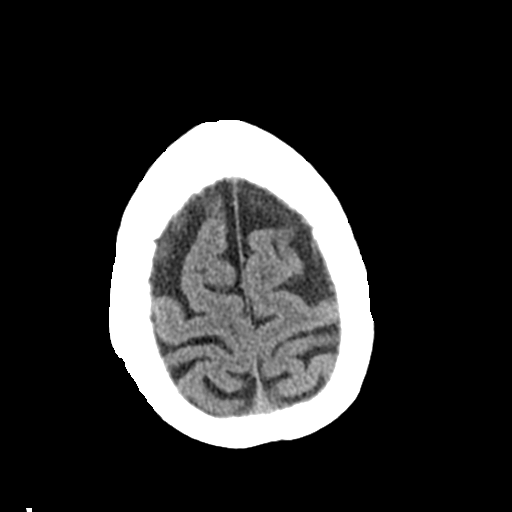
[im 30/33  brain]
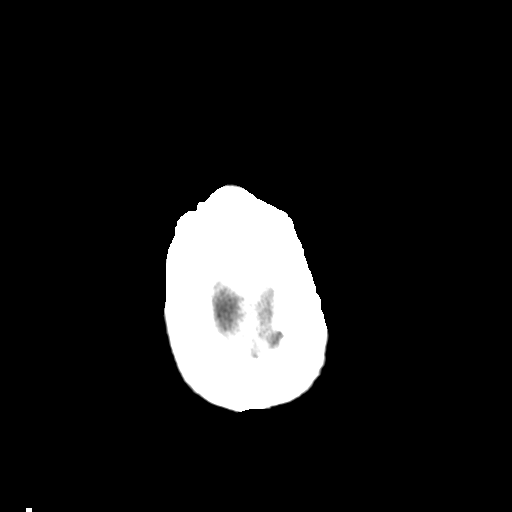
[im 30/33  bone]
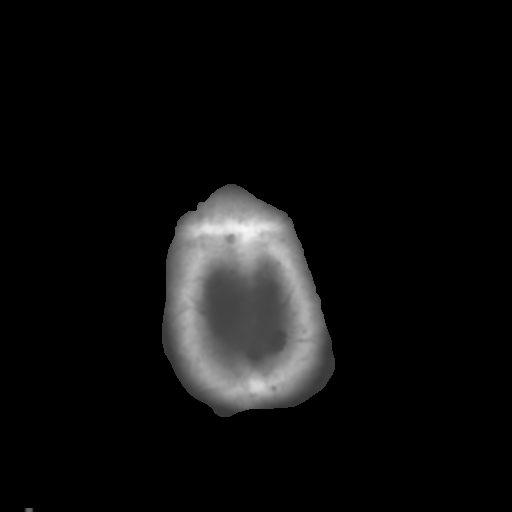

[Series 4: coronal soft · coronal · 0.30mm/px · 3 of 72 slices shown]
[im 24/72  brain]
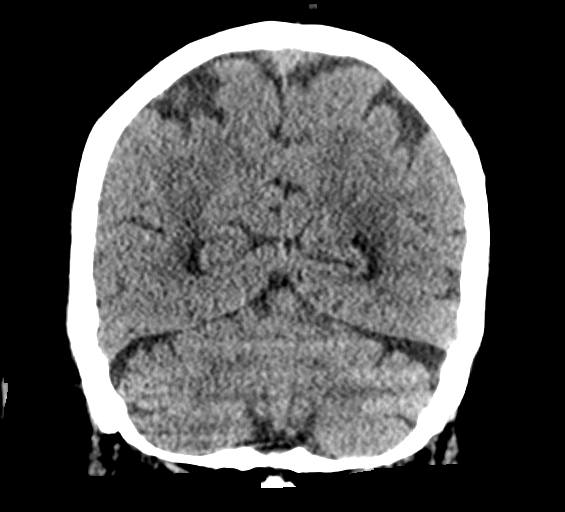
[im 32/72  brain]
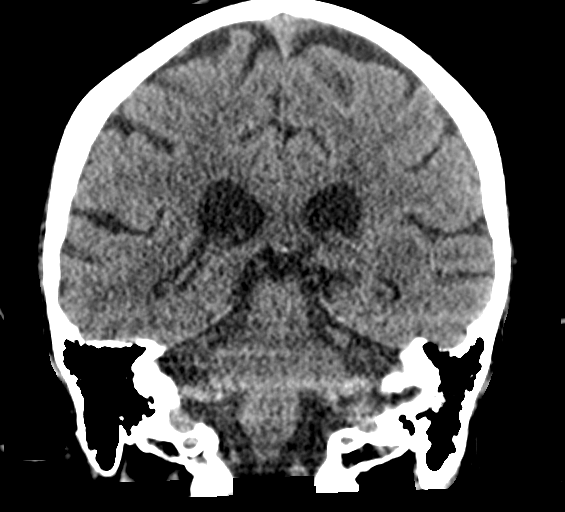
[im 40/72  brain]
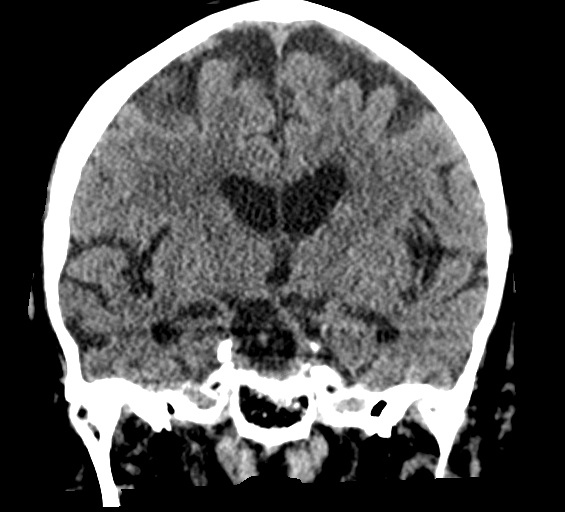

[Series 5: sagittal soft · sagittal · 0.30mm/px · 3 of 57 slices shown]
[im 19/57  brain]
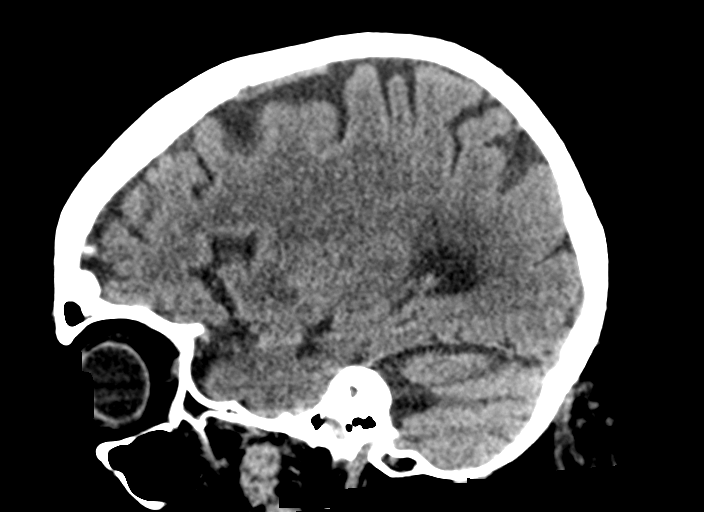
[im 29/57  brain]
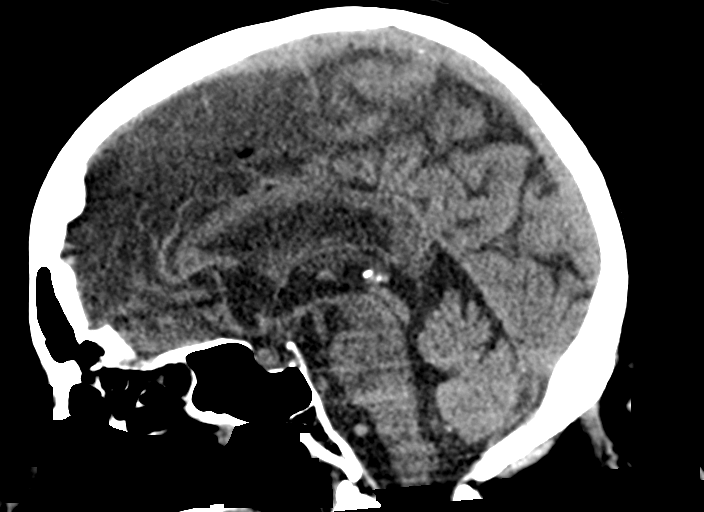
[im 38/57  brain]
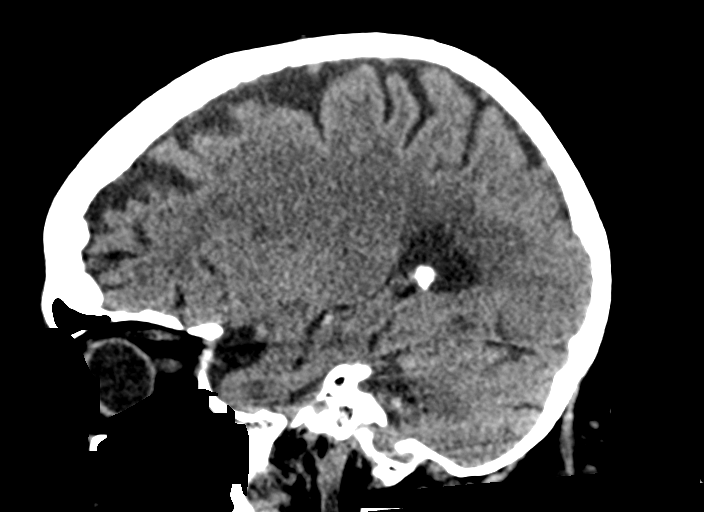

[15 of 47 positions shown; findings below may reference images not displayed]

FINDINGS: CT HEAD FINDINGS

Brain: Generalized atrophy. Mild white matter hypodensity
bilaterally, unchanged.

Negative for acute infarct, hemorrhage, mass.

Vascular: Negative for hyperdense vessel. Atherosclerotic
calcification in the carotid artery bilaterally.

Skull: Negative for skull fracture.

Sinuses/Orbits: Mild mucosal edema paranasal sinuses. Bilateral
cataract extraction

Other: None

CT CERVICAL SPINE FINDINGS

Alignment: Mild anterolisthesis C4-5 and C5-6.

Skull base and vertebrae: Negative for fracture.

Soft tissues and spinal canal: Multiple right thyroid nodules. The
largest nodule 2.4 cm. Multiple right thyroid calcifications.
Hypoplastic left lobe of the thyroid.

Disc levels: Multilevel disc and facet degeneration in the cervical
spine. No significant stenosis.

Upper chest: Negative

Other: None
IMPRESSION: 1. No acute intracranial abnormality. Chronic atrophy and chronic
white matter ischemia
2. Negative for cervical spine fracture
3. Right thyroid nodule up to 2.5 cm in diameter. Recommend thyroid
ultrasound. (Ref: [HOSPITAL]. [DATE]): 143-50).

## 2023-03-17 DEATH — deceased
